# Patient Record
Sex: Female | Born: 1965
Health system: Southern US, Community
[De-identification: ages and names within clinical notes are randomized; demographics above are authoritative.]

## PROBLEM LIST (undated history)

## (undated) DIAGNOSIS — E0500 Thyrotoxicosis with diffuse goiter without thyrotoxic crisis or storm: Secondary | ICD-10-CM

## (undated) DIAGNOSIS — N393 Stress incontinence (female) (male): Secondary | ICD-10-CM

## (undated) DIAGNOSIS — I10 Essential (primary) hypertension: Secondary | ICD-10-CM

## (undated) DIAGNOSIS — N819 Female genital prolapse, unspecified: Secondary | ICD-10-CM

## (undated) DIAGNOSIS — Z85828 Personal history of other malignant neoplasm of skin: Secondary | ICD-10-CM

## (undated) DIAGNOSIS — C801 Malignant (primary) neoplasm, unspecified: Secondary | ICD-10-CM

## (undated) DIAGNOSIS — E079 Disorder of thyroid, unspecified: Secondary | ICD-10-CM

## (undated) HISTORY — DX: Essential (primary) hypertension: I10

## (undated) HISTORY — DX: Disorder of thyroid, unspecified: E07.9

## (undated) HISTORY — DX: Malignant (primary) neoplasm, unspecified: C80.1

---

## 1994-12-12 HISTORY — PX: BUNIONECTOMY: SHX129

## 2002-11-15 ENCOUNTER — Ambulatory Visit (HOSPITAL_COMMUNITY): Admission: RE | Admit: 2002-11-15 | Discharge: 2002-11-15 | Payer: Self-pay | Admitting: Family Medicine

## 2002-11-15 ENCOUNTER — Encounter: Payer: Self-pay | Admitting: Family Medicine

## 2003-03-07 ENCOUNTER — Encounter: Admission: RE | Admit: 2003-03-07 | Discharge: 2003-03-07 | Payer: Self-pay | Admitting: Obstetrics and Gynecology

## 2003-03-28 ENCOUNTER — Encounter (INDEPENDENT_AMBULATORY_CARE_PROVIDER_SITE_OTHER): Payer: Self-pay | Admitting: *Deleted

## 2003-03-28 ENCOUNTER — Encounter: Admission: RE | Admit: 2003-03-28 | Discharge: 2003-03-28 | Payer: Self-pay | Admitting: Family Medicine

## 2003-11-26 ENCOUNTER — Ambulatory Visit (HOSPITAL_COMMUNITY): Admission: RE | Admit: 2003-11-26 | Discharge: 2003-11-26 | Payer: Self-pay | Admitting: Family Medicine

## 2004-12-15 ENCOUNTER — Ambulatory Visit (HOSPITAL_COMMUNITY): Admission: RE | Admit: 2004-12-15 | Discharge: 2004-12-15 | Payer: Self-pay | Admitting: Family Medicine

## 2005-12-19 ENCOUNTER — Ambulatory Visit (HOSPITAL_COMMUNITY): Admission: RE | Admit: 2005-12-19 | Discharge: 2005-12-19 | Payer: Self-pay | Admitting: Family Medicine

## 2006-12-14 ENCOUNTER — Ambulatory Visit (HOSPITAL_COMMUNITY): Admission: RE | Admit: 2006-12-14 | Discharge: 2006-12-14 | Payer: Self-pay | Admitting: Family Medicine

## 2007-12-13 HISTORY — PX: BREAST BIOPSY: SHX20

## 2007-12-17 ENCOUNTER — Ambulatory Visit (HOSPITAL_COMMUNITY): Admission: RE | Admit: 2007-12-17 | Discharge: 2007-12-17 | Payer: Self-pay | Admitting: Family Medicine

## 2008-01-01 ENCOUNTER — Encounter (INDEPENDENT_AMBULATORY_CARE_PROVIDER_SITE_OTHER): Payer: Self-pay | Admitting: Diagnostic Radiology

## 2008-01-01 ENCOUNTER — Encounter: Admission: RE | Admit: 2008-01-01 | Discharge: 2008-01-01 | Payer: Self-pay | Admitting: Family Medicine

## 2008-01-02 HISTORY — PX: BREAST BIOPSY: SHX20

## 2008-12-12 DIAGNOSIS — E0500 Thyrotoxicosis with diffuse goiter without thyrotoxic crisis or storm: Secondary | ICD-10-CM

## 2008-12-12 HISTORY — DX: Thyrotoxicosis with diffuse goiter without thyrotoxic crisis or storm: E05.00

## 2008-12-26 ENCOUNTER — Ambulatory Visit (HOSPITAL_COMMUNITY): Admission: RE | Admit: 2008-12-26 | Discharge: 2008-12-26 | Payer: Self-pay | Admitting: Family Medicine

## 2009-12-30 ENCOUNTER — Ambulatory Visit (HOSPITAL_COMMUNITY): Admission: RE | Admit: 2009-12-30 | Discharge: 2009-12-30 | Payer: Self-pay | Admitting: Family Medicine

## 2010-12-31 ENCOUNTER — Ambulatory Visit (HOSPITAL_COMMUNITY)
Admission: RE | Admit: 2010-12-31 | Discharge: 2010-12-31 | Payer: Self-pay | Source: Home / Self Care | Attending: Family Medicine | Admitting: Family Medicine

## 2011-11-12 LAB — HM PAP SMEAR: HM Pap smear: NORMAL

## 2011-12-13 LAB — HM MAMMOGRAPHY: HM Mammogram: NORMAL

## 2011-12-19 ENCOUNTER — Other Ambulatory Visit (HOSPITAL_COMMUNITY): Payer: Self-pay | Admitting: Family Medicine

## 2011-12-19 DIAGNOSIS — Z1231 Encounter for screening mammogram for malignant neoplasm of breast: Secondary | ICD-10-CM

## 2012-01-13 ENCOUNTER — Ambulatory Visit (HOSPITAL_COMMUNITY)
Admission: RE | Admit: 2012-01-13 | Discharge: 2012-01-13 | Disposition: A | Discharge status: Home / Self Care | Payer: Managed Care, Other (non HMO) | Source: Ambulatory Visit | Attending: Family Medicine | Admitting: Family Medicine

## 2012-01-13 DIAGNOSIS — Z1231 Encounter for screening mammogram for malignant neoplasm of breast: Secondary | ICD-10-CM | POA: Insufficient documentation

## 2012-07-13 ENCOUNTER — Encounter: Payer: Self-pay | Admitting: Endocrinology

## 2012-07-13 ENCOUNTER — Other Ambulatory Visit (INDEPENDENT_AMBULATORY_CARE_PROVIDER_SITE_OTHER): Payer: Managed Care, Other (non HMO)

## 2012-07-13 ENCOUNTER — Ambulatory Visit (INDEPENDENT_AMBULATORY_CARE_PROVIDER_SITE_OTHER): Payer: Managed Care, Other (non HMO) | Admitting: Endocrinology

## 2012-07-13 VITALS — BP 138/82 | HR 93 | Temp 97.6°F | Ht 67.5 in | Wt 145.0 lb

## 2012-07-13 DIAGNOSIS — E0500 Thyrotoxicosis with diffuse goiter without thyrotoxic crisis or storm: Secondary | ICD-10-CM | POA: Insufficient documentation

## 2012-07-13 DIAGNOSIS — E0590 Thyrotoxicosis, unspecified without thyrotoxic crisis or storm: Secondary | ICD-10-CM

## 2012-07-13 LAB — TSH: TSH: 0.04 u[IU]/mL — ABNORMAL LOW (ref 0.35–5.50)

## 2012-07-13 LAB — T4, FREE: Free T4: 1.3 ng/dL (ref 0.60–1.60)

## 2012-07-13 NOTE — Patient Instructions (Addendum)
blood tests are being requested for you today.  You will receive a letter with results.   Please consider your treatment options, and let me know. if ever you have fever while taking methimazole, stop it and call us, because of the risk of a rare side-effect.

## 2012-07-13 NOTE — Progress Notes (Signed)
  Subjective:    Patient ID: Samantha Peck, female    DOB: 11-Apr-1966, 46 y.o.   MRN: [id number]  HPI Pt was dx'ed with hyperthyroidism approx 3 1/2 yeas ago.  She was rx'ed with tapazole, but she takes intermittently due to no insurance.  She reports moderate hair loss throughout the head, and assoc anxiety.  She last took tapazole approx 2 mos ago.   Past Medical History  Diagnosis Date  . Thyroid disease     Past Surgical History  Procedure Date  . Bunionectomy 1996    bilateral    History   Social History  . Marital Status: Married    Spouse Name: N/A    Number of Children: 3  . 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 Smoking status: Current Some Day Smoker    Types: Cigarettes  . Smokeless tobacco: Never Used   Comment: 1-2 cigarettes once in a while  . Alcohol Use: Yes  . Drug Use: No  . Sexually Active: Not on file   Other Topics Concern  . Not on file   Social History Narrative   Regular exercise-noCaffeine Use-yes    No current outpatient prescriptions on file prior to visit.    No Known Allergies  Family History  Problem Relation Age of Onset  . Cancer Mother     Breast Cancer  . Diabetes Mother   . Heart disease Mother   . Hypertension Mother   . Cancer Maternal Grandmother     Breast Cancer  no thyroid probs  BP 138/82  Pulse 93  Temp 97.6 F (36.4 C) (Oral)  Ht 5' 7.5" (1.715 m)  Wt 145 lb (65.772 kg)  BMI 22.38 kg/m2  SpO2 99%  LMP 06/25/2012    Review of Systems She has palpitations, constipation, and weight gain.  She has intermittent double vision. denies headache, hoarseness, sob, polyuria, myalgias, excessive diaphoresis, numbness, tremor,  hypoglycemia, easy bruising, and rhinorrhea.  She has reg menses.     Objective:   Physical Exam VS: see vs page GEN: no distress HEAD: head: no deformity.  Normal hair distribution. eyes: no periorbital swelling, no  proptosis external nose and ears are normal mouth: no lesion seen NECK: thyroid is 4x normal size, with irregular surface.  (R>L).  No nodule is palpable CHEST WALL: no deformity LUNGS:  Clear to auscultation. CV: reg rate and rhythm, no murmur ABD: abdomen is soft, nontender.  no hepatosplenomegaly.  not distended.  no hernia.   MUSCULOSKELETAL: muscle bulk and strength are grossly normal.  no obvious joint swelling.  gait is normal and steady EXTEMITIES: no deformity.  no ulcer on the feet.  feet are of normal color and temp.  no edema PULSES: dorsalis pedis intact bilat.   NEURO:  cn 2-12 grossly intact.   readily moves all 4's.  sensation is intact to touch on the feet.  No tremor. SKIN:  Normal texture and temperature.  No rash or suspicious lesion is visible.  No diaphoretic   NODES:  None palpable at the neck.   PSYCH: alert, oriented x3.  Does not appear anxious nor depressed.    (i reviewed outside records, including [facility])    Assessment & Plan:  Hyperthyroidism. Persistent.  Uncertain if this is due to grave's dx or multinodular goiter Hair loss.  Uncertain if this is thyroid-related Anxiety, prob due to hyperthyroidism.

## 2012-07-16 ENCOUNTER — Telehealth: Payer: Self-pay | Admitting: Endocrinology

## 2012-07-16 ENCOUNTER — Telehealth: Payer: Self-pay

## 2012-07-16 MED ORDER — METOPROLOL SUCCINATE ER 25 MG PO TB24: 25.0000 mg | ORAL_TABLET | Freq: Every day | ORAL | Status: DC

## 2012-07-16 MED ORDER — METHIMAZOLE 10 MG PO TABS: 10.0000 mg | ORAL_TABLET | Freq: Two times a day (BID) | ORAL | Status: DC

## 2012-07-16 NOTE — Telephone Encounter (Signed)
Pt called to inform MD of her decision on treatment for hyperthyroid. Left message on machine for pt to return my call.

## 2012-07-16 NOTE — Telephone Encounter (Signed)
Address in other phone note. Closing.

## 2012-07-16 NOTE — Telephone Encounter (Signed)
i have sent 2 prescriptions to your pharmacy Please come back for a follow-up appointment in 2 months.

## 2012-07-16 NOTE — Telephone Encounter (Signed)
Caller: Jena/Patient; PCP: Romero Belling; CB#: [phone]; 07/16/12 - Patient seen on 07/13/12 by Dr Everardo All concerning  her Thyroid and was asked to call him back and let him know whether she wanted to go back on the Methimazole to treat it or have radiation done to "kill" the thyroid. Patient would also like a medication to help her when her heart "races" and she has trouble breathing. She has had this symptom since being diagnosed with the Hyper-Thyroidism in 2010 and has had episodes during the day at work, as well as it waking her up at night. Did not speak with Dr Everardo All about it at her appt.  Last episode was 07/14/12 during the night. All Emergent S/S of Irregular Heartbeat Protocol r/o except " decrease in activity or exercise ability AND ongoing or repeated episodes of irregualr pulse or pulse rate more than 90 beats/minute at rest". Disposition - See in 24 ( does she need to come back in the office for evaluation of this symptom).  Home Care Advice Given. Patient wants Dr Everardo All to put her back on the  Methimazole at the right dosage per her labwork and a new medication for the racing heart/breathing problems caused by the Hyper-Thyroidism .  Uses [facility] - [address] [place], [place] @ [phone]. Would like to find out today (07/16/12) if possible - CB#: [phone].

## 2012-07-16 NOTE — Telephone Encounter (Signed)
Pt informed of new rx's and to schedule F/U appointment in 2 months. Pt will callback to schedule F/U appointment.

## 2012-08-29 ENCOUNTER — Other Ambulatory Visit: Payer: Self-pay | Admitting: *Deleted

## 2012-08-29 MED ORDER — METOPROLOL SUCCINATE ER 25 MG PO TB24: 25.0000 mg | ORAL_TABLET | Freq: Every day | ORAL | Status: DC

## 2012-08-29 MED ORDER — METHIMAZOLE 10 MG PO TABS: 10.0000 mg | ORAL_TABLET | Freq: Two times a day (BID) | ORAL | Status: DC

## 2012-08-29 NOTE — Telephone Encounter (Signed)
R'cd fax from [facility] for refill of Methimazole and Metoprolol for 90 day supply.

## 2012-09-12 ENCOUNTER — Other Ambulatory Visit: Payer: Self-pay | Admitting: Endocrinology

## 2012-09-12 ENCOUNTER — Telehealth: Payer: Self-pay

## 2012-09-12 DIAGNOSIS — E0590 Thyrotoxicosis, unspecified without thyrotoxic crisis or storm: Secondary | ICD-10-CM

## 2012-09-12 NOTE — Telephone Encounter (Signed)
Pt called stating that she was advised at last OV 08/02 that she needed follow up labs in 3 mths (10/2012). Pt is requesting orders be placed, please advise.

## 2012-09-12 NOTE — Telephone Encounter (Signed)
Tried calling Pt back no answer. TSH labs were placed at [facility].

## 2012-09-12 NOTE — Telephone Encounter (Signed)
F/u appt is due within the next week.  We can do then, or when you make appt, we can sched labs for 2 days ahead.

## 2012-09-13 NOTE — Telephone Encounter (Signed)
Pt notified, has appt on 09/21/12.

## 2012-09-14 ENCOUNTER — Other Ambulatory Visit (INDEPENDENT_AMBULATORY_CARE_PROVIDER_SITE_OTHER): Payer: Managed Care, Other (non HMO)

## 2012-09-14 DIAGNOSIS — E0590 Thyrotoxicosis, unspecified without thyrotoxic crisis or storm: Secondary | ICD-10-CM

## 2012-09-14 LAB — TSH: TSH: <0.01 u[IU]/mL — ABNORMAL LOW (ref 0.35–5.50)

## 2012-09-21 ENCOUNTER — Ambulatory Visit (INDEPENDENT_AMBULATORY_CARE_PROVIDER_SITE_OTHER): Payer: Managed Care, Other (non HMO) | Admitting: Endocrinology

## 2012-09-21 ENCOUNTER — Encounter: Payer: Self-pay | Admitting: Endocrinology

## 2012-09-21 VITALS — BP 126/80 | HR 104 | Temp 97.6°F | Wt 149.0 lb

## 2012-09-21 DIAGNOSIS — E0590 Thyrotoxicosis, unspecified without thyrotoxic crisis or storm: Secondary | ICD-10-CM

## 2012-09-21 MED ORDER — METHIMAZOLE 10 MG PO TABS: ORAL_TABLET | ORAL | Status: DC

## 2012-09-21 NOTE — Progress Notes (Signed)
  Subjective:    Patient ID: Samantha Peck, female    DOB: July 18, 1966, 46 y.o.   MRN: [id number]  HPI Pt was dx'ed with hyperthyroidism approx 3 1/2 yeas ago.  She was rx'ed with tapazole, but she takes intermittently due to no insurance.  She resumed tapazole 2 mos ago.  Since back on the tapazole, hair loss is less, but she continues to have anxiety.   Past Medical History  Diagnosis Date  . Thyroid disease     Past Surgical History  Procedure Date  . Bunionectomy 1996    bilateral    History   Social History  . Marital Status: Married    Spouse Name: N/A    Number of Children: 3  . 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 Smoking status: Current Some Day Smoker    Types: Cigarettes  . Smokeless tobacco: Never Used   Comment: 1-2 cigarettes once in a while  . Alcohol Use: Yes  . Drug Use: No  . Sexually Active: Not on file   Other Topics Concern  . Not on file   Social History Narrative   Regular exercise-noCaffeine Use-yes    Current Outpatient Prescriptions on File Prior to Visit  Medication Sig Dispense Refill  . metoprolol succinate (TOPROL-XL) 25 MG 24 hr tablet Take 1 tablet (25 mg total) by mouth daily.  90 tablet  0    No Known Allergies  Family History  Problem Relation Age of Onset  . Cancer Mother     Breast Cancer  . Diabetes Mother   . Heart disease Mother   . Hypertension Mother   . Cancer Maternal Grandmother     Breast Cancer    BP 126/80  Pulse 104  Temp 97.6 F (36.4 C) (Oral)  Wt 149 lb (67.586 kg)  SpO2 98%    Review of Systems Denies fever    Objective:   Physical Exam VITAL SIGNS:  See vs page GENERAL: no distress NECK: thyroid is 4x normal size, with irregular surface.  No nodule is palpable.    Lab Results  Component Value Date   TSH <0.01* 09/14/2012      Assessment & Plan:  Hyperthyroidism, needs increased rx

## 2012-09-21 NOTE — Patient Instructions (Addendum)
Please increase the methimazole to 2x10 mg, twice a day.  i have sent a prescription to your pharmacy. Please come back for a follow-up appointment for 1 month, with blood tests a few days in advance.   if ever you have fever while taking methimazole, stop it and call us, because of the risk of a rare side-effect.

## 2012-10-12 ENCOUNTER — Other Ambulatory Visit (INDEPENDENT_AMBULATORY_CARE_PROVIDER_SITE_OTHER): Payer: Managed Care, Other (non HMO)

## 2012-10-12 DIAGNOSIS — E0590 Thyrotoxicosis, unspecified without thyrotoxic crisis or storm: Secondary | ICD-10-CM

## 2012-10-12 LAB — T4, FREE: Free T4: 0.66 ng/dL (ref 0.60–1.60)

## 2012-10-12 LAB — TSH: TSH: 0.23 u[IU]/mL — ABNORMAL LOW (ref 0.35–5.50)

## 2012-10-19 ENCOUNTER — Encounter: Payer: Self-pay | Admitting: Endocrinology

## 2012-10-19 ENCOUNTER — Ambulatory Visit (INDEPENDENT_AMBULATORY_CARE_PROVIDER_SITE_OTHER): Payer: Managed Care, Other (non HMO) | Admitting: Endocrinology

## 2012-10-19 VITALS — BP 122/70 | HR 94 | Temp 97.8°F | Wt 154.0 lb

## 2012-10-19 DIAGNOSIS — E0590 Thyrotoxicosis, unspecified without thyrotoxic crisis or storm: Secondary | ICD-10-CM

## 2012-10-19 NOTE — Progress Notes (Signed)
  Subjective:    Patient ID: Samantha Peck, female    DOB: [birth year], 46 y.o.   MRN: [id number]  HPI Pt was dx'ed with hyperthyroidism in 2010.  Tapazole was chosen as rx, due to lack of insurance.  she took only intermittently.  She resumed daily tapazole 2 mos ago, having regained her insurance.  pt states she feels well in general, except for frequent, heavy menses.   Past Medical History  Diagnosis Date  . Thyroid disease     Past Surgical History  Procedure Date  . Bunionectomy 1996    bilateral    History   Social History  . Marital Status: Married    Spouse Name: N/A    Number of Children: 3  . 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 Smoking status: Current Some Day Smoker    Types: Cigarettes  . Smokeless tobacco: Never Used     Comment: 1-2 cigarettes once in a while  . Alcohol Use: Yes  . Drug Use: No  . Sexually Active: Not on file   Other Topics Concern  . Not on file   Social History Narrative   Regular exercise-noCaffeine Use-yes    Current Outpatient Prescriptions on File Prior to Visit  Medication Sig Dispense Refill  . metoprolol succinate (TOPROL-XL) 25 MG 24 hr tablet Take 1 tablet (25 mg total) by mouth daily.  90 tablet  0    No Known Allergies  Family History  Problem Relation Age of Onset  . Cancer Mother     Breast Cancer  . Diabetes Mother   . Heart disease Mother   . Hypertension Mother   . Cancer Maternal Grandmother     Breast Cancer    BP 122/70  Pulse 94  Temp 97.8 F (36.6 C) (Oral)  Wt 154 lb (69.854 kg)  SpO2 97%  Review of Systems She has gained weight.  No fever.     Objective:   Physical Exam VITAL SIGNS:  See vs page.   GENERAL: no distress. NECK: thyroid is 4x normal size, with irregular surface.  No nodule is palpable.  Lab Results  Component Value Date   TSH 0.23* 10/12/2012      Assessment & Plan:  Hyperthyroidism, improved on tapazole

## 2012-10-19 NOTE — Patient Instructions (Addendum)
Please reduce the methimazole to 1 pill twice a day.   Please come back for a follow-up appointment for 1 month, with blood tests a few days prior.

## 2012-11-16 ENCOUNTER — Ambulatory Visit: Payer: Managed Care, Other (non HMO) | Admitting: Endocrinology

## 2012-11-21 ENCOUNTER — Telehealth: Payer: Self-pay | Admitting: *Deleted

## 2012-11-21 ENCOUNTER — Other Ambulatory Visit (INDEPENDENT_AMBULATORY_CARE_PROVIDER_SITE_OTHER): Payer: Managed Care, Other (non HMO)

## 2012-11-21 DIAGNOSIS — E0590 Thyrotoxicosis, unspecified without thyrotoxic crisis or storm: Secondary | ICD-10-CM

## 2012-11-21 NOTE — Telephone Encounter (Signed)
i ordered

## 2012-11-21 NOTE — Telephone Encounter (Signed)
PATIENT WENT TO [facility] TODAY AND BLOOD WAS DRAWN FROM PATIENT BUT NO LAB ORDERS. PLEASE ADVISE [facility] OF ORDERS.

## 2012-11-22 LAB — TSH: TSH: 0.51 u[IU]/mL (ref 0.35–5.50)

## 2012-11-22 LAB — T4, FREE: Free T4: 0.94 ng/dL (ref 0.60–1.60)

## 2012-11-23 ENCOUNTER — Ambulatory Visit (INDEPENDENT_AMBULATORY_CARE_PROVIDER_SITE_OTHER): Payer: Managed Care, Other (non HMO) | Admitting: Endocrinology

## 2012-11-23 ENCOUNTER — Encounter: Payer: Self-pay | Admitting: Endocrinology

## 2012-11-23 VITALS — BP 126/80 | HR 100 | Temp 98.6°F | Wt 150.0 lb

## 2012-11-23 DIAGNOSIS — E0590 Thyrotoxicosis, unspecified without thyrotoxic crisis or storm: Secondary | ICD-10-CM

## 2012-11-23 NOTE — Progress Notes (Signed)
  Subjective:    Patient ID: Samantha Peck, female    DOB: 07-31-1966, 46 y.o.   MRN: [id number]  HPI Pt was dx'ed with hyperthyroidism in 2010.  Tapazole was chosen as rx, due to lack of insurance.  she took only intermittently.  She resumed daily tapazole in 2013, having regained her insurance.  She wants to stay with this rx, at least for now.  pt states she feels well in general.   Past Medical History  Diagnosis Date  . Thyroid disease     Past Surgical History  Procedure Date  . Bunionectomy 1996    bilateral    History   Social History  . Marital Status: Married    Spouse Name: N/A    Number of Children: 3  . 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 Smoking status: Current Some Day Smoker    Types: Cigarettes  . Smokeless tobacco: Never Used     Comment: 1-2 cigarettes once in a while  . Alcohol Use: Yes  . Drug Use: No  . Sexually Active: Not on file   Other Topics Concern  . Not on file   Social History Narrative   Regular exercise-noCaffeine Use-yes    Current Outpatient Prescriptions on File Prior to Visit  Medication Sig Dispense Refill  . methimazole (TAPAZOLE) 10 MG tablet Take 10 mg by mouth daily.       . metoprolol succinate (TOPROL-XL) 25 MG 24 hr tablet Take 1 tablet (25 mg total) by mouth daily.  90 tablet  0    No Known Allergies  Family History  Problem Relation Age of Onset  . Cancer Mother     Breast Cancer  . Diabetes Mother   . Heart disease Mother   . Hypertension Mother   . Cancer Maternal Grandmother     Breast Cancer    BP 126/80  Pulse 100  Temp 98.6 F (37 C) (Oral)  Wt 150 lb (68.04 kg)  SpO2 97%    Review of Systems Denies fever    Objective:   Physical Exam VITAL SIGNS:  See vs page GENERAL: no distress NECK: thyroid is 4x normal size, with irregular surface.  No nodule is palpable.    Lab Results  Component Value Date   TSH 0.51 11/21/2012       Assessment & Plan:  Hyperthyroidism, much better

## 2012-11-23 NOTE — Patient Instructions (Addendum)
Please reduce the methimazole to 1 pill daily.   Please come back for a follow-up appointment in 6 weeks, with blood tests a few days prior.

## 2012-12-18 ENCOUNTER — Other Ambulatory Visit (HOSPITAL_COMMUNITY): Payer: Self-pay | Admitting: Family Medicine

## 2012-12-18 DIAGNOSIS — Z1231 Encounter for screening mammogram for malignant neoplasm of breast: Secondary | ICD-10-CM

## 2013-01-01 ENCOUNTER — Other Ambulatory Visit: Payer: Self-pay | Admitting: Endocrinology

## 2013-01-01 ENCOUNTER — Other Ambulatory Visit: Payer: Managed Care, Other (non HMO)

## 2013-01-02 LAB — T4, FREE: Free T4: 0.94 ng/dL (ref 0.80–1.80)

## 2013-01-02 LAB — TSH: TSH: 4.367 u[IU]/mL (ref 0.350–4.500)

## 2013-01-04 ENCOUNTER — Encounter: Payer: Self-pay | Admitting: Endocrinology

## 2013-01-04 ENCOUNTER — Ambulatory Visit (INDEPENDENT_AMBULATORY_CARE_PROVIDER_SITE_OTHER): Payer: Managed Care, Other (non HMO) | Admitting: Endocrinology

## 2013-01-04 VITALS — BP 120/68 | HR 78 | Wt 153.0 lb

## 2013-01-04 DIAGNOSIS — E0590 Thyrotoxicosis, unspecified without thyrotoxic crisis or storm: Secondary | ICD-10-CM

## 2013-01-04 NOTE — Patient Instructions (Addendum)
Please reduce the methimazole to 1/2 pill per day. Please come back for a follow-up appointment in 1 month, with blood tests a few days prior.

## 2013-01-04 NOTE — Progress Notes (Signed)
  Subjective:    Patient ID: Samantha Peck, female    DOB: 1966-07-07, 47 y.o.   MRN: [id number]  Thyroid Problem  Pt was dx'ed with hyperthyroidism in 2010.  Tapazole was chosen as rx, due to lack of insurance.  she took only intermittently.  She resumed daily tapazole in 2013, having regained her insurance.  She wants to stay with this rx, at least for now.  pt states she feels well in general, except for palpitations in the chest.  The tapazole was reduced in dec, 2013. Past Medical History  Diagnosis Date  . Thyroid disease     Past Surgical History  Procedure Date  . Bunionectomy 1996    bilateral    History   Social History  . Marital Status: Married    Spouse Name: N/A    Number of Children: 3  . 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 Smoking status: Current Some Day Smoker    Types: Cigarettes  . Smokeless tobacco: Never Used     Comment: 1-2 cigarettes once in a while  . Alcohol Use: Yes  . Drug Use: No  . Sexually Active: Not on file   Other Topics Concern  . Not on file   Social History Narrative   Regular exercise-noCaffeine Use-yes    Current Outpatient Prescriptions on File Prior to Visit  Medication Sig Dispense Refill  . methimazole (TAPAZOLE) 10 MG tablet Take 10 mg by mouth daily.       . metoprolol succinate (TOPROL-XL) 25 MG 24 hr tablet Take 1 tablet (25 mg total) by mouth daily.  90 tablet  0    No Known Allergies  Family History  Problem Relation Age of Onset  . Cancer Mother     Breast Cancer  . Diabetes Mother   . Heart disease Mother   . Hypertension Mother   . Cancer Maternal Grandmother     Breast Cancer    BP 120/68  Pulse 78  Wt 153 lb (69.4 kg)  SpO2 97%    Review of Systems  Denies fever    Objective:   Physical Exam VITAL SIGNS:  See vs page GENERAL: no distress NECK: thyroid is 3x normal size, with irregular surface.  No nodule is palpable.    Lab  Results  Component Value Date   TSH 4.367 01/01/2013      Assessment & Plan:  Hyperthyroidism, well-controlled, but this TSH suggests she needs further reduction of her tapazole

## 2013-01-18 ENCOUNTER — Ambulatory Visit (HOSPITAL_COMMUNITY)
Admission: RE | Admit: 2013-01-18 | Discharge: 2013-01-18 | Disposition: A | Discharge status: Home / Self Care | Payer: Managed Care, Other (non HMO) | Source: Ambulatory Visit | Attending: Family Medicine | Admitting: Family Medicine

## 2013-01-18 DIAGNOSIS — Z1231 Encounter for screening mammogram for malignant neoplasm of breast: Secondary | ICD-10-CM | POA: Insufficient documentation

## 2013-02-15 ENCOUNTER — Ambulatory Visit: Payer: Managed Care, Other (non HMO) | Admitting: Endocrinology

## 2013-02-20 ENCOUNTER — Ambulatory Visit (INDEPENDENT_AMBULATORY_CARE_PROVIDER_SITE_OTHER): Payer: Managed Care, Other (non HMO)

## 2013-02-20 DIAGNOSIS — E0590 Thyrotoxicosis, unspecified without thyrotoxic crisis or storm: Secondary | ICD-10-CM

## 2013-02-20 LAB — T4, FREE: Free T4: 0.89 ng/dL (ref 0.60–1.60)

## 2013-02-20 LAB — TSH: TSH: 0.62 u[IU]/mL (ref 0.35–5.50)

## 2013-02-22 ENCOUNTER — Ambulatory Visit (INDEPENDENT_AMBULATORY_CARE_PROVIDER_SITE_OTHER): Payer: Managed Care, Other (non HMO) | Admitting: Endocrinology

## 2013-02-22 ENCOUNTER — Encounter: Payer: Self-pay | Admitting: Endocrinology

## 2013-02-22 VITALS — BP 122/70 | HR 100 | Wt 156.0 lb

## 2013-02-22 DIAGNOSIS — E0590 Thyrotoxicosis, unspecified without thyrotoxic crisis or storm: Secondary | ICD-10-CM

## 2013-02-22 NOTE — Progress Notes (Signed)
  Subjective:    Patient ID: Samantha Peck, female    DOB: 06-29-1966, 47 y.o.   MRN: [id number]  HPI Pt was dx'ed with hyperthyroidism in 2010.  Tapazole was chosen as rx, due to lack of insurance.  she took only intermittently.  She resumed daily tapazole in 2013, having regained her insurance.  She wants to stay with this rx, at least for now.  pt states she feels well in general, except for resumption of her hair loss.  We have been reducing the tapazole.  Past Medical History  Diagnosis Date  . Thyroid disease     Past Surgical History  Procedure Laterality Date  . Bunionectomy  1996    bilateral    History   Social History  . Marital Status: Married    Spouse Name: N/A    Number of Children: 3  . 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 Smoking status: Current Some Day Smoker    Types: Cigarettes  . Smokeless tobacco: Never Used     Comment: 1-2 cigarettes once in a while  . Alcohol Use: Yes  . Drug Use: No  . Sexually Active: Not on file   Other Topics Concern  . Not on file   Social History Narrative   Regular exercise-no   Caffeine Use-yes    Current Outpatient Prescriptions on File Prior to Visit  Medication Sig Dispense Refill  . methimazole (TAPAZOLE) 10 MG tablet Take 5 mg by mouth daily.       . metoprolol succinate (TOPROL-XL) 25 MG 24 hr tablet Take 1 tablet (25 mg total) by mouth daily.  90 tablet  0   No current facility-administered medications on file prior to visit.    No Known Allergies  Family History  Problem Relation Age of Onset  . Cancer Mother     Breast Cancer  . Diabetes Mother   . Heart disease Mother   . Hypertension Mother   . Cancer Maternal Grandmother     Breast Cancer   BP 122/70  Pulse 100  Wt 156 lb (70.761 kg)  BMI 24.06 kg/m2  SpO2 96%  Review of Systems Denies fever    Objective:   Physical Exam VITAL SIGNS:  See vs page GENERAL: no distress Skin:  not diaphoretic Neuro: no tremor  Lab Results  Component Value Date   TSH 0.62 02/20/2013      Assessment & Plan:  Hyperthyroidism, well-controlled.

## 2013-02-22 NOTE — Patient Instructions (Addendum)
Please continue the same medication. Please come back for a follow-up appointment in 6 weeks, with blood tests a few days prior.

## 2013-04-03 ENCOUNTER — Ambulatory Visit (INDEPENDENT_AMBULATORY_CARE_PROVIDER_SITE_OTHER): Payer: Managed Care, Other (non HMO)

## 2013-04-03 DIAGNOSIS — E0590 Thyrotoxicosis, unspecified without thyrotoxic crisis or storm: Secondary | ICD-10-CM

## 2013-04-03 LAB — TSH: TSH: 0.78 u[IU]/mL (ref 0.35–5.50)

## 2013-04-03 LAB — T4, FREE: Free T4: 0.81 ng/dL (ref 0.60–1.60)

## 2013-04-05 ENCOUNTER — Encounter: Payer: Self-pay | Admitting: Endocrinology

## 2013-04-05 ENCOUNTER — Ambulatory Visit (INDEPENDENT_AMBULATORY_CARE_PROVIDER_SITE_OTHER): Payer: Managed Care, Other (non HMO) | Admitting: Endocrinology

## 2013-04-05 VITALS — BP 126/74 | HR 75 | Wt 159.0 lb

## 2013-04-05 DIAGNOSIS — E0590 Thyrotoxicosis, unspecified without thyrotoxic crisis or storm: Secondary | ICD-10-CM

## 2013-04-05 NOTE — Patient Instructions (Addendum)
Please reduce the metoprolol to 1/2 pill daily.   Please come back for a follow-up appointment in 3 months, with blood tests a few days prior.

## 2013-04-05 NOTE — Progress Notes (Signed)
  Subjective:    Patient ID: Samantha Peck, female    DOB: 04/16/1966, 47 y.o.   MRN: [id number]  HPI Pt was dx'ed with hyperthyroidism in 2010.  Tapazole was chosen as rx, due to lack of insurance.  she took only intermittently.  She resumed daily tapazole in 2013, having regained her insurance.  She wants to stay with this rx, at least for now.  pt states she feels well in general, except for weight gain. Past Medical History  Diagnosis Date  . Thyroid disease     Past Surgical History  Procedure Laterality Date  . Bunionectomy  1996    bilateral    History   Social History  . Marital Status: Married    Spouse Name: N/A    Number of Children: 3  . 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 Smoking status: Current Some Day Smoker    Types: Cigarettes  . Smokeless tobacco: Never Used     Comment: 1-2 cigarettes once in a while  . Alcohol Use: Yes  . Drug Use: No  . Sexually Active: Not on file   Other Topics Concern  . Not on file   Social History Narrative   Regular exercise-no   Caffeine Use-yes    Current Outpatient Prescriptions on File Prior to Visit  Medication Sig Dispense Refill  . methimazole (TAPAZOLE) 10 MG tablet Take 5 mg by mouth daily.        No current facility-administered medications on file prior to visit.    No Known Allergies  Family History  Problem Relation Age of Onset  . Cancer Mother     Breast Cancer  . Diabetes Mother   . Heart disease Mother   . Hypertension Mother   . Cancer Maternal Grandmother     Breast Cancer    BP 126/74  Pulse 75  Wt 159 lb (72.122 kg)  BMI 24.52 kg/m2  SpO2 97%  Review of Systems Denies fever.    Objective:   Physical Exam VITAL SIGNS:  See vs page GENERAL: no distress NECK: thyroid is 4x normal size, with irregular surface (L>R).  No nodule is palpable.   Lab Results  Component Value Date   TSH 0.78 04/03/2013      Assessment &  Plan:  Hyperthyroidism, well-controlled

## 2013-06-05 ENCOUNTER — Ambulatory Visit: Payer: Managed Care, Other (non HMO)

## 2013-06-05 DIAGNOSIS — E0590 Thyrotoxicosis, unspecified without thyrotoxic crisis or storm: Secondary | ICD-10-CM

## 2013-06-06 LAB — T4, FREE: Free T4: 0.82 ng/dL (ref 0.60–1.60)

## 2013-06-06 LAB — TSH: TSH: 1.12 u[IU]/mL (ref 0.35–5.50)

## 2013-06-07 ENCOUNTER — Encounter: Payer: Self-pay | Admitting: Endocrinology

## 2013-06-07 ENCOUNTER — Ambulatory Visit (INDEPENDENT_AMBULATORY_CARE_PROVIDER_SITE_OTHER): Payer: Managed Care, Other (non HMO) | Admitting: Endocrinology

## 2013-06-07 VITALS — BP 132/80 | HR 109 | Ht 67.0 in | Wt 148.0 lb

## 2013-06-07 DIAGNOSIS — E0590 Thyrotoxicosis, unspecified without thyrotoxic crisis or storm: Secondary | ICD-10-CM

## 2013-06-07 NOTE — Patient Instructions (Addendum)
Please come back for a follow-up appointment in 4 months, with blood tests a few days prior.   Please continue the same methimazole.  Please see dr Jeannetta Nap, for the fact that your heartbeat is still fast, despite the normal thyroid.

## 2013-06-07 NOTE — Progress Notes (Signed)
  Subjective:    Patient ID: Samantha Peck, female    DOB: [birth year], 47 y.o.   MRN: [id number]  HPI Pt was dx'ed with hyperthyroidism in 2010.  Tapazole was chosen as rx, due to lack of insurance.  she took only intermittently.  She resumed daily tapazole in 2013, having regained her insurance.  She wants to stay with this rx, at least for now.  She takes it as rx'ed.  pt states she feels well in general.  She has lost weight, due to her efforts. Past Medical History  Diagnosis Date  . Thyroid disease     Past Surgical History  Procedure Laterality Date  . Bunionectomy  1996    bilateral    History   Social History  . Marital Status: Married    Spouse Name: N/A    Number of Children: 3  . 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 Smoking status: Current Some Day Smoker    Types: Cigarettes  . Smokeless tobacco: Never Used     Comment: 1-2 cigarettes once in a while  . Alcohol Use: Yes  . Drug Use: No  . Sexually Active: Not on file   Other Topics Concern  . Not on file   Social History Narrative   Regular exercise-no   Caffeine Use-yes    Current Outpatient Prescriptions on File Prior to Visit  Medication Sig Dispense Refill  . methimazole (TAPAZOLE) 10 MG tablet Take 5 mg by mouth daily.       . metoprolol succinate (TOPROL-XL) 25 MG 24 hr tablet 1/2 tab daily       No current facility-administered medications on file prior to visit.   No Known Allergies  Family History  Problem Relation Age of Onset  . Cancer Mother     Breast Cancer  . Diabetes Mother   . Heart disease Mother   . Hypertension Mother   . Cancer Maternal Grandmother     Breast Cancer   BP 132/80  Pulse 109  Ht 5\' 7"  (1.702 m)  Wt 148 lb (67.132 kg)  BMI 23.17 kg/m2  SpO2 95%  Review of Systems Denies fever and tremor    Objective:   Physical Exam VITAL SIGNS:  See vs page GENERAL: no distress NECK: thyroid is 4x normal  size, with irregular surface (L>R).  No nodule is palpable.    Lab Results  Component Value Date   TSH 1.12 06/05/2013      Assessment & Plan:  Hyperthyroidism, well-controlled Tachycardia, persistent.

## 2013-10-09 ENCOUNTER — Other Ambulatory Visit: Payer: Self-pay | Admitting: Endocrinology

## 2013-10-10 LAB — TSH: TSH: 3.31 u[IU]/mL (ref 0.350–4.500)

## 2013-10-10 LAB — T4, FREE: Free T4: 1.02 ng/dL (ref 0.80–1.80)

## 2013-10-11 ENCOUNTER — Encounter: Payer: Self-pay | Admitting: Endocrinology

## 2013-10-11 ENCOUNTER — Ambulatory Visit (INDEPENDENT_AMBULATORY_CARE_PROVIDER_SITE_OTHER): Payer: Managed Care, Other (non HMO) | Admitting: Endocrinology

## 2013-10-11 VITALS — BP 118/68 | HR 92 | Temp 98.0°F | Resp 10 | Ht 67.0 in | Wt 148.3 lb

## 2013-10-11 DIAGNOSIS — E0590 Thyrotoxicosis, unspecified without thyrotoxic crisis or storm: Secondary | ICD-10-CM

## 2013-10-11 NOTE — Progress Notes (Signed)
  Subjective:    Patient ID: Samantha Peck, female    DOB: Sep 16, 1966, 47 y.o.   MRN: [id number]  HPI Pt was dx'ed with hyperthyroidism in 2010.  Tapazole was chosen as rx, due to lack of insurance.  she took only intermittently.  She resumed daily tapazole in 2013, having regained her insurance.  She wants to stay with this rx, at least for now.  She takes it as rx'ed.  pt states she feels well in general.  She has lost weight, due to her efforts. Past Medical History  Diagnosis Date  . Thyroid disease     Past Surgical History  Procedure Laterality Date  . Bunionectomy  1996    bilateral    History   Social History  . Marital Status: Married    Spouse Name: N/A    Number of Children: 3  . 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 Smoking status: Current Some Day Smoker    Types: Cigarettes  . Smokeless tobacco: Never Used     Comment: 1-2 cigarettes once in a while  . Alcohol Use: Yes  . Drug Use: No  . Sexual Activity: Not on file   Other Topics Concern  . Not on file   Social History Narrative   Regular exercise-no   Caffeine Use-yes    Current Outpatient Prescriptions on File Prior to Visit  Medication Sig Dispense Refill  . methimazole (TAPAZOLE) 10 MG tablet Take 5 mg by mouth daily.       . metoprolol succinate (TOPROL-XL) 25 MG 24 hr tablet Take 25 mg by mouth as needed. 1/2 tab daily       No current facility-administered medications on file prior to visit.    No Known Allergies  Family History  Problem Relation Age of Onset  . Cancer Mother     Breast Cancer  . Diabetes Mother   . Heart disease Mother   . Hypertension Mother   . Cancer Maternal Grandmother     Breast Cancer    BP 118/68  Pulse 92  Temp(Src) 98 F (36.7 C) (Oral)  Resp 10  Ht 5\' 7"  (1.702 m)  Wt 148 lb 4.8 oz (67.268 kg)  BMI 23.22 kg/m2  SpO2 99%  Review of Systems Denies fever    Objective:   Physical  Exam VITAL SIGNS:  See vs page GENERAL: no distress NECK: thyroid is approx 4x normal size, with irregular surface (L>R).  No nodule is palpable.    Lab Results  Component Value Date   TSH 3.310 10/09/2013      Assessment & Plan:  Hyperthyroidism, well-controlled Goiter: stable

## 2013-10-11 NOTE — Patient Instructions (Signed)
Please come back for a follow-up appointment in 6 months, with blood tests a few days prior.   Please continue the same methimazole.  if ever you have fever while taking methimazole, stop it and call us, because of the risk of a rare side-effect.  

## 2013-12-10 ENCOUNTER — Other Ambulatory Visit (HOSPITAL_COMMUNITY): Payer: Self-pay | Admitting: Family Medicine

## 2013-12-10 DIAGNOSIS — Z1231 Encounter for screening mammogram for malignant neoplasm of breast: Secondary | ICD-10-CM

## 2013-12-25 ENCOUNTER — Other Ambulatory Visit: Payer: Self-pay

## 2013-12-25 MED ORDER — METHIMAZOLE 10 MG PO TABS: 5.0000 mg | ORAL_TABLET | Freq: Every day | ORAL | Status: DC

## 2014-01-24 ENCOUNTER — Ambulatory Visit (HOSPITAL_COMMUNITY)
Admission: RE | Admit: 2014-01-24 | Discharge: 2014-01-24 | Disposition: A | Discharge status: Home / Self Care | Payer: Managed Care, Other (non HMO) | Source: Ambulatory Visit | Attending: Family Medicine | Admitting: Family Medicine

## 2014-01-24 DIAGNOSIS — Z1231 Encounter for screening mammogram for malignant neoplasm of breast: Secondary | ICD-10-CM | POA: Insufficient documentation

## 2014-04-09 ENCOUNTER — Other Ambulatory Visit: Payer: Self-pay

## 2014-04-09 ENCOUNTER — Other Ambulatory Visit (INDEPENDENT_AMBULATORY_CARE_PROVIDER_SITE_OTHER): Payer: Managed Care, Other (non HMO)

## 2014-04-09 DIAGNOSIS — E0590 Thyrotoxicosis, unspecified without thyrotoxic crisis or storm: Secondary | ICD-10-CM

## 2014-04-09 LAB — TSH: TSH: 1.68 u[IU]/mL (ref 0.35–5.50)

## 2014-04-09 LAB — T4, FREE: Free T4: 0.82 ng/dL (ref 0.60–1.60)

## 2014-04-11 ENCOUNTER — Encounter: Payer: Self-pay | Admitting: Endocrinology

## 2014-04-11 ENCOUNTER — Ambulatory Visit (INDEPENDENT_AMBULATORY_CARE_PROVIDER_SITE_OTHER): Payer: Managed Care, Other (non HMO) | Admitting: Endocrinology

## 2014-04-11 VITALS — BP 132/70 | HR 103 | Temp 97.9°F | Ht 67.0 in | Wt 149.0 lb

## 2014-04-11 DIAGNOSIS — E0590 Thyrotoxicosis, unspecified without thyrotoxic crisis or storm: Secondary | ICD-10-CM

## 2014-04-11 MED ORDER — TRIAMCINOLONE ACETONIDE 0.1 % EX CREA: 1.0000 "application " | TOPICAL_CREAM | Freq: Three times a day (TID) | CUTANEOUS | Status: DC

## 2014-04-11 NOTE — Patient Instructions (Addendum)
Please come back for a follow-up appointment in 6 months, with blood tests a few days prior.   Please continue the same methimazole.  if ever you have fever while taking methimazole, stop it and call us, because of the risk of a rare side-effect.  i have sent a prescription to your pharmacy, for a steroid cream.   Try taking antihistamine eye drops as needed.

## 2014-04-11 NOTE — Progress Notes (Signed)
  Subjective:    Patient ID: Samantha Peck, female    DOB: 01-17-1966, 48 y.o.   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   Pt states 2 weeks of moderate rash throughout the body, and assoc itching. Past Medical History  Diagnosis Date  . Thyroid disease     Past Surgical History  Procedure Laterality Date  . Bunionectomy  1996    bilateral    History   Social History  . Marital Status: Married    Spouse Name: N/A    Number of Children: 3  . 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 Smoking status: Current Some Day Smoker    Types: Cigarettes  . Smokeless tobacco: Never Used     Comment: 1-2 cigarettes once in a while  . Alcohol Use: Yes  . Drug Use: No  . Sexual Activity: Not on file   Other Topics Concern  . Not on file   Social History Narrative   Regular exercise-no   Caffeine Use-yes    Current Outpatient Prescriptions on File Prior to Visit  Medication Sig Dispense Refill  . methimazole (TAPAZOLE) 10 MG tablet Take 0.5 tablets (5 mg total) by mouth daily.  30 tablet  3  . metoprolol succinate (TOPROL-XL) 25 MG 24 hr tablet Take 25 mg by mouth as needed. 1/2 tab daily       No current facility-administered medications on file prior to visit.    No Known Allergies  Family History  Problem Relation Age of Onset  . Cancer Mother     Breast Cancer  . Diabetes Mother   . Heart disease Mother   . Hypertension Mother   . Cancer Maternal Grandmother     Breast Cancer    BP 132/70  Pulse 103  Temp(Src) 97.9 F (36.6 C) (Oral)  Ht 5\' 7"  (1.702 m)  Wt 149 lb (67.586 kg)  BMI 23.33 kg/m2  SpO2 97%  Review of  Systems Denies fever and weight change.      Objective:   Physical Exam VITAL SIGNS:  See vs page GENERAL: no distress Left eye: slight conjunctival injection.   Skin: mild diffuse urticaria.     Lab Results  Component Value Date   TSH 1.68 04/09/2014      Assessment & Plan:  Hyperthyroidism, well-controlled Goiter: stable Urticaria: new, uncertain etiology

## 2014-07-14 ENCOUNTER — Telehealth: Payer: Self-pay | Admitting: Endocrinology

## 2014-09-12 ENCOUNTER — Other Ambulatory Visit: Payer: Self-pay

## 2014-09-12 MED ORDER — METHIMAZOLE 10 MG PO TABS: 5.0000 mg | ORAL_TABLET | Freq: Every day | ORAL | Status: DC

## 2014-09-17 ENCOUNTER — Telehealth: Payer: Self-pay

## 2014-09-17 MED ORDER — METHIMAZOLE 10 MG PO TABS: 5.0000 mg | ORAL_TABLET | Freq: Every day | ORAL | Status: DC

## 2014-09-17 NOTE — Telephone Encounter (Signed)
Rx sent for Methimazole to [facility].

## 2014-10-08 ENCOUNTER — Other Ambulatory Visit (INDEPENDENT_AMBULATORY_CARE_PROVIDER_SITE_OTHER): Payer: Managed Care, Other (non HMO)

## 2014-10-08 ENCOUNTER — Other Ambulatory Visit: Payer: Self-pay

## 2014-10-08 DIAGNOSIS — E0590 Thyrotoxicosis, unspecified without thyrotoxic crisis or storm: Secondary | ICD-10-CM

## 2014-10-08 LAB — T4, FREE: Free T4: 1 ng/dL (ref 0.60–1.60)

## 2014-10-08 LAB — TSH: TSH: 1.65 u[IU]/mL (ref 0.35–4.50)

## 2014-10-13 ENCOUNTER — Ambulatory Visit: Payer: Managed Care, Other (non HMO) | Admitting: Endocrinology

## 2014-10-13 ENCOUNTER — Ambulatory Visit (INDEPENDENT_AMBULATORY_CARE_PROVIDER_SITE_OTHER): Payer: Managed Care, Other (non HMO) | Admitting: Endocrinology

## 2014-10-13 ENCOUNTER — Encounter: Payer: Self-pay | Admitting: Endocrinology

## 2014-10-13 VITALS — BP 120/88 | HR 93 | Temp 98.2°F | Ht 67.0 in | Wt 147.0 lb

## 2014-10-13 DIAGNOSIS — E0590 Thyrotoxicosis, unspecified without thyrotoxic crisis or storm: Secondary | ICD-10-CM

## 2014-10-13 NOTE — Patient Instructions (Signed)
Please come back for a follow-up appointment in 6 months, with blood tests a few days prior.   Please continue the same methimazole.  if ever you have fever while taking methimazole, stop it and call us, because of the risk of a rare side-effect.

## 2014-10-13 NOTE — Progress Notes (Signed)
   Subjective:    Patient ID: Samantha Peck, female    DOB: 05/21/1966, 48 y.o.   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  pt states she feels well in general.   Past Medical History  Diagnosis Date  . Thyroid disease     Past Surgical History  Procedure Laterality Date  . Bunionectomy  1996    bilateral    History   Social History  . Marital Status: Married    Spouse Name: N/A    Number of Children: 3  . 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 Smoking status: Current Some Day Smoker    Types: Cigarettes  . Smokeless tobacco: Never Used     Comment: 1-2 cigarettes once in a while  . Alcohol Use: Yes  . Drug Use: No  . Sexual Activity: Not on file   Other Topics Concern  . Not on file   Social History Narrative   Regular exercise-no   Caffeine Use-yes    Current Outpatient Prescriptions on File Prior to Visit  Medication Sig Dispense Refill  . clonazePAM (KLONOPIN) 0.5 MG tablet     . methimazole (TAPAZOLE) 10 MG tablet Take 0.5 tablets (5 mg total) by mouth daily. 30 tablet 3  . triamcinolone cream (KENALOG) 0.1 % Apply 1 application topically 3 (three) times daily. As needed for itching 45 g 1  . metoprolol succinate (TOPROL-XL) 25 MG 24 hr tablet Take 25 mg by mouth as needed. 1/2 tab daily     No current facility-administered medications on file prior to visit.    No Known Allergies  Family History  Problem Relation Age of Onset  . Cancer Mother     Breast Cancer  . Diabetes Mother   . Heart disease Mother   . Hypertension Mother   . Cancer Maternal Grandmother     Breast Cancer    BP 120/88 mmHg  Pulse  93  Temp(Src) 98.2 F (36.8 C) (Oral)  Ht 5\' 7"  (1.702 m)  Wt 147 lb (66.679 kg)  BMI 23.02 kg/m2  SpO2 94%  Review of Systems Denies fever.     Objective:   Physical Exam VITAL SIGNS:  See vs page GENERAL: no distress NECK: thyroid is approx 4x normal size, with irregular surface (L>R).  No nodule is palpable.    Lab Results  Component Value Date   TSH 1.65 10/08/2014       Assessment & Plan:  Hyperthyroidism: well-controlled.   Patient is advised the following: Patient Instructions  Please come back for a follow-up appointment in 6 months, with blood tests a few days prior.   Please continue the same methimazole.  if ever you have fever while taking methimazole, stop it and call us, because of the risk of a rare side-effect.

## 2014-10-24 ENCOUNTER — Other Ambulatory Visit (HOSPITAL_COMMUNITY): Payer: Self-pay | Admitting: Family Medicine

## 2014-10-24 DIAGNOSIS — Z1231 Encounter for screening mammogram for malignant neoplasm of breast: Secondary | ICD-10-CM

## 2014-12-10 NOTE — Telephone Encounter (Signed)
error 

## 2015-01-27 ENCOUNTER — Ambulatory Visit (HOSPITAL_COMMUNITY)
Admission: RE | Admit: 2015-01-27 | Discharge: 2015-01-27 | Disposition: A | Discharge status: Home / Self Care | Payer: Managed Care, Other (non HMO) | Source: Ambulatory Visit | Attending: Family Medicine | Admitting: Family Medicine

## 2015-01-27 DIAGNOSIS — Z1231 Encounter for screening mammogram for malignant neoplasm of breast: Secondary | ICD-10-CM | POA: Diagnosis present

## 2015-04-09 ENCOUNTER — Other Ambulatory Visit: Payer: Managed Care, Other (non HMO)

## 2015-04-13 ENCOUNTER — Ambulatory Visit: Payer: Managed Care, Other (non HMO) | Admitting: Internal Medicine

## 2015-09-20 ENCOUNTER — Other Ambulatory Visit: Payer: Self-pay | Admitting: Endocrinology

## 2015-12-28 ENCOUNTER — Other Ambulatory Visit: Payer: Self-pay | Admitting: Family Medicine

## 2015-12-28 DIAGNOSIS — Z1231 Encounter for screening mammogram for malignant neoplasm of breast: Secondary | ICD-10-CM

## 2016-01-29 ENCOUNTER — Ambulatory Visit
Admission: RE | Admit: 2016-01-29 | Discharge: 2016-01-29 | Disposition: A | Discharge status: Home / Self Care | Payer: No Typology Code available for payment source | Source: Ambulatory Visit | Attending: Family Medicine | Admitting: Family Medicine

## 2016-01-29 DIAGNOSIS — Z1231 Encounter for screening mammogram for malignant neoplasm of breast: Secondary | ICD-10-CM

## 2016-06-22 MED FILL — AMLODIPINE BESYLATE 5 MG TA: 5 | 90 days supply | Qty: 90 | Fill #0

## 2016-06-23 MED FILL — METOPROLOL SUCC ER 100 MG T: 100 | 90 days supply | Qty: 90 | Fill #0

## 2016-07-05 ENCOUNTER — Telehealth: Payer: Self-pay | Admitting: Internal Medicine

## 2016-07-05 NOTE — Telephone Encounter (Signed)
LM for pt to call back.

## 2016-07-05 NOTE — Telephone Encounter (Signed)
-----   Message from Philemon Kingdom, MD sent at 06/30/2016 11:48 AM EDT ----- Regarding: RE: pt wants to switch to Joella Prince, C ----- Message -----    From: Armen Pickup    Sent: 06/30/2016  11:07 AM      To: Renato Shin, MD, Brunilda Payor, # Subject: pt wants to switch to [facility]                  Pt is asking to switch from Dr. Loanne Drilling last seen in Nov 2015  Pt # [phone]

## 2016-08-24 ENCOUNTER — Encounter: Payer: Self-pay | Admitting: Internal Medicine

## 2016-08-24 ENCOUNTER — Ambulatory Visit (INDEPENDENT_AMBULATORY_CARE_PROVIDER_SITE_OTHER): Payer: Self-pay | Admitting: Internal Medicine

## 2016-08-24 VITALS — BP 140/84 | HR 92 | Wt 160.0 lb

## 2016-08-24 DIAGNOSIS — E0590 Thyrotoxicosis, unspecified without thyrotoxic crisis or storm: Secondary | ICD-10-CM

## 2016-08-24 LAB — TSH: TSH: 0.38 u[IU]/mL (ref 0.35–4.50)

## 2016-08-24 LAB — T4, FREE: Free T4: 2.48 ng/dL — ABNORMAL HIGH (ref 0.60–1.60)

## 2016-08-24 LAB — T3, FREE: T3, Free: 8.3 pg/mL — ABNORMAL HIGH (ref 2.3–4.2)

## 2016-08-24 NOTE — Progress Notes (Addendum)
Patient ID: Samantha Peck, female   DOB: May 17, 1966, 50 y.o.   MRN: [id number]    HPI  Samantha Peck is a 50 y.o.-year-old female, referred by her PCP, Dr. Redmond Pulling, for evaluation for thyrotoxicosis, likely secondary to Graves' disease. She prev. saw Dr. Loanne Drilling, last visit 10/2014.  She was dx with Graves ds. In 2010 (lost 15 lbs then)  >> started MMI >> but not compliant 2/2 lack of insurance >>  now off the med completely since 11/2015. She now has insurance.   I reviewed pt's thyroid tests: No labs since ~ 2 years ago... Lab Results  Component Value Date   TSH 1.65 10/08/2014   TSH 1.68 04/09/2014   TSH 3.310 10/09/2013   TSH 1.12 06/05/2013   TSH 0.78 04/03/2013   TSH 0.62 02/20/2013   TSH 4.367 01/01/2013   TSH 0.51 11/21/2012   TSH 0.23 (L) 10/12/2012   TSH <0.01 (L) 09/14/2012   FREET4 1.00 10/08/2014   FREET4 0.82 04/09/2014   FREET4 1.02 10/09/2013   FREET4 0.82 06/05/2013   FREET4 0.81 04/03/2013   FREET4 0.89 02/20/2013   FREET4 0.94 01/01/2013   FREET4 0.94 11/21/2012   FREET4 0.66 10/12/2012   FREET4 1.30 07/13/2012     Pt denies feeling nodules in neck, hoarseness, dysphagia/odynophagia, SOB with lying down; she c/o: - + fatigue - + weight gain - no excessive sweating/heat intolerance - no tremors - no anxiety - no palpitations - no hyperdefecation, + constipation - no hair loss  Pt does not have FH of thyroid ds. No FH of thyroid cancer. No h/o radiation tx to head or neck.  No seaweed or kelp, no recent contrast studies. No steroid use. No herbal supplements. No Biotin use.  ROS: Constitutional: + see HPI, + nocturia Eyes: no blurry vision, no xerophthalmia ENT: no sore throat, no nodules palpated in throat, no dysphagia/odynophagia, no hoarseness Cardiovascular: no CP/SOB/palpitations/leg swelling Respiratory: no cough/SOB Gastrointestinal: no N/V/D/C Musculoskeletal: no muscle/joint aches Skin: no rashes Neurological: no  tremors/numbness/tingling/dizziness Psychiatric: no depression/anxiety  Past Medical History:  Diagnosis Date  . Thyroid disease    Past Surgical History:  Procedure Laterality Date  . BUNIONECTOMY  1996   bilateral   Social History   Social History  . Marital status: Married    Spouse name: N/A  . Number of children: 3  . 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 Smoking status: Current Some Day Smoker    Types: Cigarettes  . Smokeless tobacco: Never Used     Comment: 1-2 cigarettes once in a while  . Alcohol use Yes  . Drug use: No  . Sexual activity: Not on file   Other Topics Concern  . Not on file   Social History Narrative   Regular exercise-no   Caffeine Use-yes   Current Outpatient Prescriptions on File Prior to Visit  Medication Sig Dispense Refill  . clonazePAM (KLONOPIN) 0.5 MG tablet     . methimazole (TAPAZOLE) 10 MG tablet Take 0.5 tablets (5 mg total) by mouth daily. **PT NEEDS APPT FOR FURTHER REFILLS** (Patient not taking: Reported on 08/24/2016) 30 tablet 0  . metoprolol succinate (TOPROL-XL) 25 MG 24 hr tablet Take 25 mg by mouth as needed. 1/2 tab daily    . triamcinolone cream (KENALOG) 0.1 % Apply 1 application topically 3 (three) times daily. As needed for itching (Patient not taking: Reported on 08/24/2016) 45 g  1   No current facility-administered medications on file prior to visit.    No Known Allergies Family History  Problem Relation Age of Onset  . Cancer Mother     Breast Cancer  . Diabetes Mother   . Heart disease Mother   . Hypertension Mother   . Cancer Maternal Grandmother     Breast Cancer    PE: BP 140/84 (BP Location: Left Arm, Patient Position: Sitting)   Pulse 92   Wt 160 lb (72.6 kg)   LMP 08/05/2016   SpO2 96%   BMI 25.06 kg/m  Wt Readings from Last 3 Encounters:  08/24/16 160 lb (72.6 kg)  10/13/14 147 lb (66.7 kg)  04/11/14 149 lb (67.6 kg)    Constitutional: overweight, in NAD Eyes: PERRLA, EOMI, no exophthalmos, no lid lag, no stare ENT: moist mucous membranes, no thyromegaly, no thyroid bruits, no cervical lymphadenopathy Cardiovascular: RRR, No MRG Respiratory: CTA B Gastrointestinal: abdomen soft, NT, ND, BS+ Musculoskeletal: no deformities, strength intact in all 4 Skin: moist, warm, no rashes Neurological: no tremor with outstretched hands, DTR normal in all 4  ASSESSMENT: 1. Thyrotoxicosis - likely Graves ds.  PLAN:  1. Patient with a history of thyrotoxicosis, most likely Graves' disease, with initial thyrotoxic sxs, now all resolved, but with hypothyroid symptoms: Weight gain, fatigue, constipation. The symptoms improved after she came off methimazole, but not completely. She is wondering whether she could have become hypothyroid. This is definitely a possibility. - We will check TSH, fT3 and fT4 and also add thyroid stimulating antibodies to demonstrate Graves' disease. However, I explained that if the antibodies are negative, it may mean that her Graves' disease in remission. - I do not feel that we need to add beta blockers at this time, since she is not tachycardic (HR in the 80s the end of the appt), anxious, or tremulous - RTC in 4 months, but possibly sooner for repeat labs  Component     Latest Ref Rng & Units 08/24/2016  TSH     0.35 - 4.50 uIU/mL 0.38  T4,Free(Direct)     0.60 - 1.60 ng/dL 2.48 (H)  Triiodothyronine,Free,Serum     2.3 - 4.2 pg/mL 8.3 (H)  TSI     <140 % baseline 258 (H)   TSH is borderline normal, however, free T4 and free T3 are high. Her TSI antibodies are also high, confirming active Graves disease. I would suggest to restart methimazole, low dose, 5 mg daily and recheck her tests in 6-8 weeks.  Philemon Kingdom, MD PhD [facility]

## 2016-08-24 NOTE — Patient Instructions (Signed)
Please stop at the lab.  For now, stay off Methimazole.  Please come back for a follow-up appointment in 4 months.

## 2016-08-28 LAB — THYROID STIMULATING IMMUNOGLOBULIN: TSI: 258 % baseline — ABNORMAL HIGH (ref <140)

## 2016-08-30 MED ORDER — METHIMAZOLE 5 MG PO TABS: 5.0000 mg | ORAL_TABLET | Freq: Every day | ORAL | 2 refills | Status: DC

## 2016-08-30 MED FILL — methIMAzole 5 MG TABS: 5 | 60 days supply | Qty: 60 | Fill #0

## 2016-09-26 MED FILL — AMLODIPINE BESYLATE 5 MG TA: 5 | 90 days supply | Qty: 90 | Fill #1

## 2016-09-27 MED FILL — METOPROLOL SUCC ER 100 MG T: 100 | 90 days supply | Qty: 90 | Fill #0

## 2016-10-17 DIAGNOSIS — D485 Neoplasm of uncertain behavior of skin: Secondary | ICD-10-CM | POA: Diagnosis not present

## 2016-10-17 DIAGNOSIS — Z23 Encounter for immunization: Secondary | ICD-10-CM | POA: Diagnosis not present

## 2016-10-17 DIAGNOSIS — C44319 Basal cell carcinoma of skin of other parts of face: Secondary | ICD-10-CM | POA: Diagnosis not present

## 2016-10-25 ENCOUNTER — Other Ambulatory Visit (INDEPENDENT_AMBULATORY_CARE_PROVIDER_SITE_OTHER): Payer: 59

## 2016-10-25 DIAGNOSIS — E0590 Thyrotoxicosis, unspecified without thyrotoxic crisis or storm: Secondary | ICD-10-CM

## 2016-10-25 LAB — T4, FREE: Free T4: 4.98 ng/dL — ABNORMAL HIGH (ref 0.60–1.60)

## 2016-10-25 LAB — T3, FREE: T3, Free: 16.9 pg/mL — ABNORMAL HIGH (ref 2.3–4.2)

## 2016-10-25 LAB — TSH: TSH: 0.63 u[IU]/mL (ref 0.35–4.50)

## 2016-10-26 ENCOUNTER — Encounter: Payer: Self-pay | Admitting: Internal Medicine

## 2016-10-27 ENCOUNTER — Other Ambulatory Visit: Payer: Self-pay

## 2016-10-27 ENCOUNTER — Telehealth: Payer: Self-pay | Admitting: Internal Medicine

## 2016-10-27 MED ORDER — METHIMAZOLE 5 MG PO TABS: 5.0000 mg | ORAL_TABLET | Freq: Two times a day (BID) | ORAL | 0 refills | Status: DC

## 2016-10-27 MED FILL — methIMAzole 5 MG TABS: 5 | 45 days supply | Qty: 90 | Fill #0

## 2016-10-27 NOTE — Telephone Encounter (Signed)
Please send the increased dosage of the Methimazole to the [facility].

## 2016-10-27 NOTE — Telephone Encounter (Signed)
Sent!

## 2016-10-28 DIAGNOSIS — E0500 Thyrotoxicosis with diffuse goiter without thyrotoxic crisis or storm: Secondary | ICD-10-CM | POA: Diagnosis not present

## 2016-10-28 DIAGNOSIS — Z0000 Encounter for general adult medical examination without abnormal findings: Secondary | ICD-10-CM | POA: Diagnosis not present

## 2016-10-31 MED FILL — LOSARTAN POTASSIUM 50 MG TA: 50 | 90 days supply | Qty: 90 | Fill #0

## 2016-11-01 MED FILL — CHLORTHALIDONE 25 MG TABLET: 25 | 60 days supply | Qty: 30 | Fill #0 | Status: TO

## 2016-11-26 ENCOUNTER — Encounter (HOSPITAL_COMMUNITY): Payer: Self-pay | Admitting: Emergency Medicine

## 2016-11-26 ENCOUNTER — Ambulatory Visit (HOSPITAL_COMMUNITY)
Admission: EM | Admit: 2016-11-26 | Discharge: 2016-11-26 | Disposition: A | Discharge status: Home / Self Care | Payer: 59 | Attending: Family Medicine | Admitting: Family Medicine

## 2016-11-26 DIAGNOSIS — J40 Bronchitis, not specified as acute or chronic: Secondary | ICD-10-CM

## 2016-11-26 DIAGNOSIS — R05 Cough: Secondary | ICD-10-CM | POA: Diagnosis not present

## 2016-11-26 DIAGNOSIS — R059 Cough, unspecified: Secondary | ICD-10-CM

## 2016-11-26 HISTORY — DX: Thyrotoxicosis with diffuse goiter without thyrotoxic crisis or storm: E05.00

## 2016-11-26 MED ORDER — BENZONATATE 100 MG PO CAPS: 200.0000 mg | ORAL_CAPSULE | Freq: Three times a day (TID) | ORAL | 0 refills | Status: DC | PRN

## 2016-11-26 MED ORDER — METHYLPREDNISOLONE 4 MG PO TBPK: ORAL_TABLET | ORAL | 0 refills | Status: DC

## 2016-11-26 MED ORDER — AZITHROMYCIN 250 MG PO TABS: 250.0000 mg | ORAL_TABLET | Freq: Every day | ORAL | 0 refills | Status: DC

## 2016-11-26 NOTE — ED Provider Notes (Signed)
CSN: [id number]     Arrival date & time 11/26/16  1227 History   First MD Initiated Contact with Patient 11/26/16 1414     Chief Complaint  Patient presents with  . URI   (Consider location/radiation/quality/duration/timing/severity/associated sxs/prior Treatment) Patient c/o cough and wheezing for a week.  She is having a lot of coughing and she does smoke on occasion.   The history is provided by the patient.  URI  Presenting symptoms: congestion, cough, fatigue and rhinorrhea   Severity:  Moderate Onset quality:  Sudden Duration:  1 week Timing:  Constant Progression:  Waxing and waning Chronicity:  New Relieved by:  Nothing Worsened by:  Nothing   Past Medical History:  Diagnosis Date  . Graves disease   . Thyroid disease    Past Surgical History:  Procedure Laterality Date  . BUNIONECTOMY  1996   bilateral   Family History  Problem Relation Age of Onset  . Cancer Mother     Breast Cancer  . Diabetes Mother   . Heart disease Mother   . Hypertension Mother   . Cancer Maternal Grandmother     Breast Cancer   Social History  Substance Use Topics  . Smoking status: Current Some Day Smoker    Types: Cigarettes  . Smokeless tobacco: Never Used     Comment: 1-2 cigarettes once in a while  . Alcohol use Yes   OB History    No data available     Review of Systems  Constitutional: Positive for fatigue.  HENT: Positive for congestion and rhinorrhea.   Eyes: Negative.   Respiratory: Positive for cough.   Cardiovascular: Negative.   Gastrointestinal: Negative.   Endocrine: Negative.   Genitourinary: Negative.   Skin: Negative.   Allergic/Immunologic: Negative.   Neurological: Negative.   Hematological: Negative.   Psychiatric/Behavioral: Negative.     Allergies  Patient has no known allergies.  Home Medications   Prior to Admission medications   Medication Sig Start Date End Date Taking? Authorizing Provider  losartan (COZAAR) 50 MG tablet Take  50 mg by mouth daily.   Yes Historical Provider, MD  Pseudoeph-Doxylamine-DM-APAP (NYQUIL PO) Take by mouth.   Yes Historical Provider, MD  AMLODIPINE BESYLATE PO Take 5 mg by mouth.    Historical Provider, MD  azithromycin (ZITHROMAX) 250 MG tablet Take 1 tablet (250 mg total) by mouth daily. Take first 2 tablets together, then 1 every day until finished. 11/26/16   Lysbeth Penner, FNP  benzonatate (TESSALON) 100 MG capsule Take 2 capsules (200 mg total) by mouth 3 (three) times daily as needed for cough. 11/26/16   Lysbeth Penner, FNP  clonazePAM (KLONOPIN) 0.5 MG tablet  03/17/14   Historical Provider, MD  methimazole (TAPAZOLE) 5 MG tablet Take 1 tablet (5 mg total) by mouth 2 (two) times daily. 10/27/16   Philemon Kingdom, MD  methylPREDNISolone (MEDROL DOSEPAK) 4 MG TBPK tablet Take 6-5-4-3-2-1 po qd 11/26/16   Lysbeth Penner, FNP  metoprolol succinate (TOPROL-XL) 100 MG 24 hr tablet  06/23/16   Historical Provider, MD  metoprolol succinate (TOPROL-XL) 25 MG 24 hr tablet Take 25 mg by mouth as needed. 1/2 tab daily 08/29/12 08/29/13  Renato Shin, MD  triamcinolone cream (KENALOG) 0.1 % Apply 1 application topically 3 (three) times daily. As needed for itching Patient not taking: Reported on 08/24/2016 04/11/14   Renato Shin, MD   Meds Ordered and Administered this Visit  Medications - No data to display  BP  125/84 (BP Location: Left Arm)   Pulse 104   Temp 98.2 F (36.8 C) (Oral)   Resp 22   LMP 11/19/2016   SpO2 99%  No data found.   Physical Exam  Constitutional: She appears well-developed and well-nourished.  HENT:  Head: Normocephalic.  Right Ear: External ear normal.  Left Ear: External ear normal.  Mouth/Throat: Oropharynx is clear and moist.  Eyes: Conjunctivae and EOM are normal. Pupils are equal, round, and reactive to light.  Neck: Normal range of motion. Neck supple.  Cardiovascular: Normal rate, regular rhythm and normal heart sounds.   Pulmonary/Chest: Effort  normal and breath sounds normal.  Abdominal: Soft. Bowel sounds are normal.  Nursing note and vitals reviewed.   Urgent Care Course   Clinical Course     Procedures (including critical care time)  Labs Review Labs Reviewed - No data to display  Imaging Review No results found.   Visual Acuity Review  Right Eye Distance:   Left Eye Distance:   Bilateral Distance:    Right Eye Near:   Left Eye Near:    Bilateral Near:         MDM   1. Bronchitis   2. Cough    zpak Tessalon Medrol dose pack  Push po fluids, rest, tylenol and motrin otc prn as directed for fever, arthralgias, and myalgias.  Follow up prn if sx's continue or persist.    Lysbeth Penner, FNP 11/26/16 [phone]

## 2016-11-26 NOTE — ED Triage Notes (Signed)
Cough for a week, chest burning.  Phlegm was clear, then white.  No known fever.  complains of sore throat.  No family members sick.  Patient works IT in hospital

## 2016-11-30 DIAGNOSIS — Z85828 Personal history of other malignant neoplasm of skin: Secondary | ICD-10-CM | POA: Diagnosis not present

## 2016-11-30 DIAGNOSIS — C44319 Basal cell carcinoma of skin of other parts of face: Secondary | ICD-10-CM | POA: Diagnosis not present

## 2016-11-30 MED FILL — DOXYCYCLINE HYCLATE 100 MG: 100 | 5 days supply | Qty: 10 | Fill #0

## 2016-12-01 ENCOUNTER — Other Ambulatory Visit (INDEPENDENT_AMBULATORY_CARE_PROVIDER_SITE_OTHER): Payer: 59

## 2016-12-01 ENCOUNTER — Telehealth: Payer: Self-pay | Admitting: *Deleted

## 2016-12-01 DIAGNOSIS — E0590 Thyrotoxicosis, unspecified without thyrotoxic crisis or storm: Secondary | ICD-10-CM

## 2016-12-01 LAB — T3, FREE: T3, Free: 7 pg/mL — ABNORMAL HIGH (ref 2.3–4.2)

## 2016-12-01 LAB — TSH: TSH: 0.01 u[IU]/mL — ABNORMAL LOW (ref 0.35–4.50)

## 2016-12-01 LAB — T4, FREE: Free T4: 2.54 ng/dL — ABNORMAL HIGH (ref 0.60–1.60)

## 2016-12-01 NOTE — Telephone Encounter (Signed)
Received call from [place] at [facility] lab stating that the pt was there to have lab drawn but there was not lab entered.  Placed future orders and sent.//AB/CMA

## 2016-12-02 ENCOUNTER — Encounter: Payer: Self-pay | Admitting: Internal Medicine

## 2016-12-02 MED ORDER — METHIMAZOLE 5 MG PO TABS: ORAL_TABLET | ORAL | 0 refills | Status: DC

## 2016-12-09 ENCOUNTER — Other Ambulatory Visit: Payer: Self-pay | Admitting: Internal Medicine

## 2016-12-13 MED FILL — methIMAzole 5 MG TABS: 5 | 45 days supply | Qty: 90 | Fill #0

## 2016-12-13 NOTE — Telephone Encounter (Signed)
Refill of methimazole (TAPAZOLE) 5 MG tablet  Send to   [place], [place] - [place] [phone] (Phone) [phone] (Fax)

## 2016-12-23 ENCOUNTER — Ambulatory Visit (INDEPENDENT_AMBULATORY_CARE_PROVIDER_SITE_OTHER): Payer: 59 | Admitting: Internal Medicine

## 2016-12-23 ENCOUNTER — Encounter: Payer: Self-pay | Admitting: Internal Medicine

## 2016-12-23 VITALS — BP 108/70 | HR 96 | Wt 152.0 lb

## 2016-12-23 DIAGNOSIS — E0500 Thyrotoxicosis with diffuse goiter without thyrotoxic crisis or storm: Secondary | ICD-10-CM

## 2016-12-23 LAB — T3, FREE: T3, Free: 5 pg/mL — ABNORMAL HIGH (ref 2.3–4.2)

## 2016-12-23 LAB — T4, FREE: Free T4: 1.43 ng/dL (ref 0.60–1.60)

## 2016-12-23 LAB — TSH: TSH: 0.07 u[IU]/mL — ABNORMAL LOW (ref 0.35–4.50)

## 2016-12-23 NOTE — Progress Notes (Signed)
Patient ID: Samantha Peck, female   DOB: 1966-05-04, 51 y.o.   MRN: [id number]    HPI  Samantha Peck is a 51 y.o.-year-old female, referred by her PCP, Dr. Redmond Pulling, for evaluation for thyrotoxicosis, likely secondary to Graves' disease. She prev. saw Dr. Loanne Drilling, last visit 10/2014. Last visit with me 4 mo ago.  She had bronchitis before Christmas >> was on ABx and Prednisone taper.  She was dx with Graves ds. In 2010 (lost 15 lbs then)  >> started MMI >> but not compliant 2/2 lack of insurance >>  off the med completely since 11/2015. She started to have insurance again last year >> we restarted MMI at last visit, 5 mg daily >> subsequent TSH was low >> we increased MMI to 10 mg in am and 5 mg in pm. She feels a little better.  I reviewed pt's thyroid tests: Lab Results  Component Value Date   TSH 0.01 (L) 12/01/2016   TSH 0.63 10/25/2016   TSH 0.38 08/24/2016   TSH 1.65 10/08/2014   TSH 1.68 04/09/2014   TSH 3.310 10/09/2013   TSH 1.12 06/05/2013   TSH 0.78 04/03/2013   TSH 0.62 02/20/2013   TSH 4.367 01/01/2013   FREET4 2.54 (H) 12/01/2016   FREET4 4.98 (H) 10/25/2016   FREET4 2.48 (H) 08/24/2016   FREET4 1.00 10/08/2014   FREET4 0.82 04/09/2014   FREET4 1.02 10/09/2013   FREET4 0.82 06/05/2013   FREET4 0.81 04/03/2013   FREET4 0.89 02/20/2013   FREET4 0.94 01/01/2013    Component     Latest Ref Rng & Units 08/24/2016  TSI     <140 % baseline 258 (H)    Pt denies feeling nodules in neck, hoarseness, dysphagia/odynophagia, SOB with lying down; she c/o: - + fatigue, + insomnia - + tremors - + weight loss - no excessive sweating/heat intolerance - no anxiety - no palpitations - no hyperdefecation, no constipation - no hair loss  Pt does not have FH of thyroid ds. No FH of thyroid cancer. No h/o radiation tx to head or neck.  No seaweed or kelp, no recent contrast studies. No steroid use. No herbal supplements. No Biotin use.  ROS: Constitutional: + see HPI, no  nocturia Eyes: no blurry vision, no xerophthalmia ENT: no sore throat, no nodules palpated in throat, + dysphagia/no odynophagia, no hoarseness Cardiovascular: no CP/SOB/palpitations/leg swelling Respiratory: no cough/SOB Gastrointestinal: no N/V/D/C Musculoskeletal: no muscle/joint aches Skin: no rashes Neurological: no tremors/numbness/tingling/dizziness  I reviewed pt's medications, allergies, PMH, social hx, family hx, and changes were documented in the history of present illness. Otherwise, unchanged from my initial visit note.  Past Medical History:  Diagnosis Date  . Graves disease   . Thyroid disease    Past Surgical History:  Procedure Laterality Date  . BUNIONECTOMY  1996   bilateral   Social History   Social History  . Marital status: Married    Spouse name: N/A  . Number of children: 3  . 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 Smoking status: Current Some Day Smoker    Types: Cigarettes  . Smokeless tobacco: Never Used     Comment: 1-2 cigarettes once in a while  . Alcohol use Yes  . Drug use: No  . Sexual activity: Not on file   Other Topics Concern  . Not on file   Social History Narrative   Regular exercise-no  Caffeine Use-yes   No Known Allergies Family History  Problem Relation Age of Onset  . Cancer Mother     Breast Cancer  . Diabetes Mother   . Heart disease Mother   . Hypertension Mother   . Cancer Maternal Grandmother     Breast Cancer   Current Outpatient Prescriptions  Medication Sig Dispense Refill  . losartan (COZAAR) 50 MG tablet Take 50 mg by mouth daily.    . methimazole (TAPAZOLE) 5 MG tablet Take 10 mg in a.m. and 5 mg in p.m. 90 tablet 0  . metoprolol succinate (TOPROL-XL) 100 MG 24 hr tablet   0  . AMLODIPINE BESYLATE PO Take 5 mg by mouth.    . clonazePAM (KLONOPIN) 0.5 MG tablet     . Pseudoeph-Doxylamine-DM-APAP (NYQUIL PO) Take by mouth.    .  triamcinolone cream (KENALOG) 0.1 % Apply 1 application topically 3 (three) times daily. As needed for itching (Patient not taking: Reported on 12/23/2016) 45 g 1   No current facility-administered medications for this visit.     PE: BP 108/70 (BP Location: Left Arm, Patient Position: Sitting)   Pulse 96   Wt 152 lb (68.9 kg)   LMP 11/07/2016   SpO2 97%   BMI 23.81 kg/m  Wt Readings from Last 3 Encounters:  12/23/16 152 lb (68.9 kg)  08/24/16 160 lb (72.6 kg)  10/13/14 147 lb (66.7 kg)   Constitutional: normal weight, in NAD Eyes: PERRLA, EOMI, no exophthalmos, no lid lag, no stare ENT: moist mucous membranes, + symmetric slight thyromegaly, no cervical lymphadenopathy Cardiovascular: tachycardia, RR, No MRG Respiratory: CTA B Gastrointestinal: abdomen soft, NT, ND, BS+ Musculoskeletal: no deformities, strength intact in all 4 Skin: moist, warm, no rashes Neurological: + tremor with outstretched hands, DTR normal in all 4  ASSESSMENT: 1. Thyrotoxicosis - likely Graves ds.  PLAN:  1. Patient with a history of thyrotoxicosis, most likely Graves' disease, with initial thyrotoxic sxs, then all resolved, but with recurring thyrotoxic sxs off MMI >> we restarted MMI at last visit, now on 10 mg in am and 5 mg in pm.  - Will repeat TFTs in 1 week, 1 mo after previous - We discussed about RAI tx, which I suggested. Discussed protocol and post radioactivity precautions. She will think about it, but for now, she refuses this. - She is on 100 mg Toprol XL >> continue  - RTC in 4 months, but likely sooner for repeat labs  Component     Latest Ref Rng & Units 12/01/2016 12/23/2016  TSH     0.35 - 4.50 uIU/mL 0.01 (L) 0.07 (L)  Triiodothyronine,Free,Serum     2.3 - 4.2 pg/mL 7.0 (H) 5.0 (H)  T4,Free(Direct)     0.60 - 1.60 ng/dL 2.54 (H) 1.43   TFTs are improving. I would suggest to continue the current dose of methimazole and repeat the labs in 5-6 weeks.  Philemon Kingdom, MD  PhD [facility]

## 2016-12-23 NOTE — Patient Instructions (Addendum)
Please stop at the lab.  Please continue Methimazole 10 mg in am and 5 mg in pm.  Come back for labs in 1 week.  Please come back for a follow-up appointment in 4 months.

## 2017-01-02 MED FILL — METOPROLOL SUCC ER 100 MG T: 100 | 90 days supply | Qty: 90 | Fill #1

## 2017-01-09 ENCOUNTER — Other Ambulatory Visit: Payer: Self-pay | Admitting: Family Medicine

## 2017-01-09 DIAGNOSIS — Z1231 Encounter for screening mammogram for malignant neoplasm of breast: Secondary | ICD-10-CM

## 2017-01-13 ENCOUNTER — Other Ambulatory Visit: Payer: Self-pay | Admitting: Internal Medicine

## 2017-01-16 ENCOUNTER — Other Ambulatory Visit: Payer: Self-pay | Admitting: Internal Medicine

## 2017-01-23 MED FILL — methIMAzole 5 MG TABS: 5 | 45 days supply | Qty: 90 | Fill #0 | Status: TO

## 2017-01-30 MED FILL — LOSARTAN POTASSIUM 50 MG TA: 50 | 90 days supply | Qty: 90 | Fill #0 | Status: TO

## 2017-02-02 ENCOUNTER — Other Ambulatory Visit (INDEPENDENT_AMBULATORY_CARE_PROVIDER_SITE_OTHER): Payer: 59

## 2017-02-02 DIAGNOSIS — E0500 Thyrotoxicosis with diffuse goiter without thyrotoxic crisis or storm: Secondary | ICD-10-CM | POA: Diagnosis not present

## 2017-02-02 LAB — T3, FREE: T3, Free: 5.5 pg/mL — ABNORMAL HIGH (ref 2.3–4.2)

## 2017-02-02 LAB — TSH: TSH: <0.01 u[IU]/mL — ABNORMAL LOW (ref 0.35–4.50)

## 2017-02-02 LAB — T4, FREE: Free T4: 1.03 ng/dL (ref 0.60–1.60)

## 2017-02-03 ENCOUNTER — Other Ambulatory Visit: Payer: Self-pay | Admitting: Internal Medicine

## 2017-02-03 ENCOUNTER — Encounter: Payer: Self-pay | Admitting: Internal Medicine

## 2017-02-03 DIAGNOSIS — E0500 Thyrotoxicosis with diffuse goiter without thyrotoxic crisis or storm: Secondary | ICD-10-CM

## 2017-02-03 MED ORDER — METHIMAZOLE 5 MG PO TABS: ORAL_TABLET | ORAL | 1 refills | Status: DC

## 2017-02-07 ENCOUNTER — Ambulatory Visit
Admission: RE | Admit: 2017-02-07 | Discharge: 2017-02-07 | Disposition: A | Discharge status: Home / Self Care | Payer: 59 | Source: Ambulatory Visit | Attending: Family Medicine | Admitting: Family Medicine

## 2017-02-07 DIAGNOSIS — Z1231 Encounter for screening mammogram for malignant neoplasm of breast: Secondary | ICD-10-CM

## 2017-02-08 DIAGNOSIS — H524 Presbyopia: Secondary | ICD-10-CM | POA: Diagnosis not present

## 2017-02-09 ENCOUNTER — Other Ambulatory Visit: Payer: Self-pay | Admitting: Family Medicine

## 2017-02-09 DIAGNOSIS — R928 Other abnormal and inconclusive findings on diagnostic imaging of breast: Secondary | ICD-10-CM

## 2017-02-13 ENCOUNTER — Ambulatory Visit
Admission: RE | Admit: 2017-02-13 | Discharge: 2017-02-13 | Disposition: A | Discharge status: Home / Self Care | Payer: 59 | Source: Ambulatory Visit | Attending: Family Medicine | Admitting: Family Medicine

## 2017-02-13 DIAGNOSIS — R928 Other abnormal and inconclusive findings on diagnostic imaging of breast: Secondary | ICD-10-CM | POA: Diagnosis not present

## 2017-02-13 DIAGNOSIS — N6489 Other specified disorders of breast: Secondary | ICD-10-CM | POA: Diagnosis not present

## 2017-02-27 ENCOUNTER — Other Ambulatory Visit: Payer: Self-pay | Admitting: Internal Medicine

## 2017-02-28 MED FILL — methIMAzole 5 MG TABS: 5 | 45 days supply | Qty: 90 | Fill #0

## 2017-02-28 MED FILL — CHLORTHALIDONE 25 MG TABLET: 25 | 60 days supply | Qty: 30 | Fill #0

## 2017-03-13 DIAGNOSIS — J019 Acute sinusitis, unspecified: Secondary | ICD-10-CM | POA: Diagnosis not present

## 2017-03-13 MED FILL — FLUTICASONE PROP 50 MCG SPR: 50 | 30 days supply | Qty: 16 | Fill #0

## 2017-03-17 ENCOUNTER — Other Ambulatory Visit (INDEPENDENT_AMBULATORY_CARE_PROVIDER_SITE_OTHER): Payer: 59

## 2017-03-17 DIAGNOSIS — E0500 Thyrotoxicosis with diffuse goiter without thyrotoxic crisis or storm: Secondary | ICD-10-CM

## 2017-03-17 LAB — T3, FREE: T3, Free: 4.4 pg/mL — ABNORMAL HIGH (ref 2.3–4.2)

## 2017-03-17 LAB — TSH: TSH: <0.01 u[IU]/mL — ABNORMAL LOW (ref 0.35–4.50)

## 2017-03-17 LAB — T4, FREE: Free T4: 0.71 ng/dL (ref 0.60–1.60)

## 2017-03-27 ENCOUNTER — Other Ambulatory Visit: Payer: Self-pay | Admitting: Internal Medicine

## 2017-04-04 ENCOUNTER — Telehealth: Payer: Self-pay | Admitting: Internal Medicine

## 2017-04-04 ENCOUNTER — Other Ambulatory Visit: Payer: Self-pay

## 2017-04-04 ENCOUNTER — Telehealth: Payer: Self-pay

## 2017-04-04 MED ORDER — METHIMAZOLE 5 MG PO TABS: ORAL_TABLET | ORAL | 1 refills | Status: DC

## 2017-04-04 MED FILL — methIMAzole 5 MG TABS: 5 | 30 days supply | Qty: 90 | Fill #0

## 2017-04-04 NOTE — Telephone Encounter (Signed)
[facility] Call: Caller states she called in her Rx for a second or third time, the Rx is wrong and is needing to be changed and corrected.

## 2017-04-04 NOTE — Telephone Encounter (Signed)
Called and notified patient I had submitted the correct dosage.

## 2017-04-04 NOTE — Telephone Encounter (Signed)
Patient returning phone call. Patient states that medication still says 2 tablets a day and it is supposed to be 3 tablets a day. Okay to leave a detailed message.

## 2017-04-04 NOTE — Telephone Encounter (Signed)
Submitted correct dosage after reading the last email from [place] to patient.

## 2017-04-19 MED FILL — DENTA 5000 PLUS CREAM: 1.1 | 60 days supply | Qty: 102 | Fill #0

## 2017-04-21 ENCOUNTER — Encounter: Payer: Self-pay | Admitting: Internal Medicine

## 2017-04-21 ENCOUNTER — Ambulatory Visit (INDEPENDENT_AMBULATORY_CARE_PROVIDER_SITE_OTHER): Payer: 59 | Admitting: Internal Medicine

## 2017-04-21 VITALS — BP 102/62 | HR 69 | Ht 67.0 in | Wt 144.0 lb

## 2017-04-21 DIAGNOSIS — E0500 Thyrotoxicosis with diffuse goiter without thyrotoxic crisis or storm: Secondary | ICD-10-CM

## 2017-04-21 NOTE — Patient Instructions (Signed)
Please continue Methimazole 10 mg in am and 5 mg in pm.  Come back for labs in 2 weeks.  Talk to your PCP about decreasing your BP medicines.

## 2017-04-21 NOTE — Progress Notes (Signed)
Patient ID: Samantha Peck, female   DOB: 04/16/1966, 51 y.o.   MRN: [id number]    HPI  Samantha Peck is a 51 y.o.-year-old female, referred by her PCP, Dr. Redmond Pulling, for evaluation for thyrotoxicosis, likely secondary to Graves' disease. She prev. saw Dr. Loanne Drilling, last visit 10/2014. Last visit with me 4 mo ago.  Reviewed and addended hx: She was dx with Graves ds. In 2010 (lost 15 lbs then)  >> started MMI >> but not compliant 2/2 lack of insurance >>  off the med completely since 11/2015. She started to have insurance again last year >> we restarted MMI.  Last dose change was to 10 mg in am and 5 mg in pm in 01/2017 >> TFTs improved afterwards.  She was off MMI x 4 days (ran out - pb with pharmacy) 2 weeks ago. Now back on above dose.  I reviewed pt's thyroid tests: Lab Results  Component Value Date   TSH <0.01 Repeated and verified X2. (L) 03/17/2017   TSH <0.01 (L) 02/02/2017   TSH 0.07 (L) 12/23/2016   TSH 0.01 (L) 12/01/2016   TSH 0.63 10/25/2016   TSH 0.38 08/24/2016   TSH 1.65 10/08/2014   TSH 1.68 04/09/2014   TSH 3.310 10/09/2013   TSH 1.12 06/05/2013   FREET4 0.71 03/17/2017   FREET4 1.03 02/02/2017   FREET4 1.43 12/23/2016   FREET4 2.54 (H) 12/01/2016   FREET4 4.98 (H) 10/25/2016   FREET4 2.48 (H) 08/24/2016   FREET4 1.00 10/08/2014   FREET4 0.82 04/09/2014   FREET4 1.02 10/09/2013   FREET4 0.82 06/05/2013    Component     Latest Ref Rng & Units 08/24/2016  TSI     <140 % baseline 258 (H)   Pt denies: - feeling nodules in neck - hoarseness - dysphagia - choking - SOB with lying down  She c/o: - + fatigue - + hair loss - no tremors - + weight loss - no excessive sweating/heat intolerance - no anxiety - no palpitations - no hyperdefecation, no constipation - no hair loss  Pt does not have FH of thyroid ds. No FH of thyroid cancer. No h/o radiation tx to head or neck.  No seaweed or kelp. No recent contrast studies. No herbal supplements. No Biotin  use. No recent steroids use.   ROS: Constitutional: + see HPI Eyes: no blurry vision, no xerophthalmia ENT: no sore throat, + see HPI Cardiovascular: no CP/no SOB/no palpitations/no leg swelling Respiratory: no cough/no SOB/no wheezing Gastrointestinal: no N/no V/no D/no C/no acid reflux Musculoskeletal: no muscle aches/no joint aches Skin: no rashes, + hair loss Neurological: no tremors/no numbness/no tingling/no dizziness  I reviewed pt's medications, allergies, PMH, social hx, family hx, and changes were documented in the history of present illness. Otherwise, unchanged from my initial visit note.  Past Medical History:  Diagnosis Date  . Graves disease   . Thyroid disease    Past Surgical History:  Procedure Laterality Date  . BUNIONECTOMY  1996   bilateral   Social History   Social History  . Marital status: Married    Spouse name: N/A  . Number of children: 3  . 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 Smoking status: Current Some Day Smoker    Types: Cigarettes  . Smokeless tobacco: Never Used     Comment: 1-2 cigarettes once in a while  . Alcohol use Yes  .  Drug use: No  . Sexual activity: Not on file   Other Topics Concern  . Not on file   Social History Narrative   Regular exercise-no   Caffeine Use-yes   No Known Allergies Family History  Problem Relation Age of Onset  . Cancer Mother        Breast Cancer  . Diabetes Mother   . Heart disease Mother   . Hypertension Mother   . Breast cancer Mother   . Cancer Maternal Grandmother        Breast Cancer  . Breast cancer Maternal Grandmother    Current Outpatient Prescriptions  Medication Sig Dispense Refill  . chlorthalidone (HYGROTON) 25 MG tablet   0  . losartan (COZAAR) 50 MG tablet Take 50 mg by mouth daily.    . methimazole (TAPAZOLE) 5 MG tablet Take 10 mg in the am, and 5 mg in the pm 90 tablet 1  . metoprolol succinate  (TOPROL-XL) 100 MG 24 hr tablet   0  . clonazePAM (KLONOPIN) 0.5 MG tablet     . Pseudoeph-Doxylamine-DM-APAP (NYQUIL PO) Take by mouth.    . triamcinolone cream (KENALOG) 0.1 % Apply 1 application topically 3 (three) times daily. As needed for itching (Patient not taking: Reported on 12/23/2016) 45 g 1   No current facility-administered medications for this visit.     PE: BP 102/62 (BP Location: Left Arm, Patient Position: Sitting)   Pulse 69   Ht 5\' 7"  (1.702 m)   Wt 144 lb (65.3 kg)   LMP 03/27/2017   SpO2 98%   BMI 22.55 kg/m  Wt Readings from Last 3 Encounters:  04/21/17 144 lb (65.3 kg)  12/23/16 152 lb (68.9 kg)  08/24/16 160 lb (72.6 kg)   Constitutional: overweight, in NAD Eyes: PERRLA, EOMI, no exophthalmos ENT: moist mucous membranes, + symmetric slight thyromegaly, no cervical lymphadenopathy Cardiovascular: RRR, No MRG Respiratory: CTA B Gastrointestinal: abdomen soft, NT, ND, BS+ Musculoskeletal: no deformities, strength intact in all 4 Skin: moist, warm, no rashes Neurological: + tremor with outstretched hands, DTR normal in all 4   ASSESSMENT: 1. Thyrotoxicosis - likely Graves ds.  PLAN:  1. Patient with a history of thyrotoxicosis, likely Graves ds., with initial thyrotoxic sxs, now resolved - but has hair loss and (intentional) weight loss. We increased MMI to 10 mg in am and 5 mg in pm >> labs improved on this per last check. Will recheck in 2 weeks as she has been off [place] for 4 days 2 weeks ago. - We again discussed about RAI tx, which I suggested >> she refuses this - She is on 50 mg Toprol XL but reports low BP at home >> OK to stop but advised her to discuss with PCP also - RTC in 6 mo, but sooner for labs  Philemon Kingdom, MD PhD [facility]

## 2017-05-03 MED FILL — methIMAzole 5 MG TABS: 5 | 30 days supply | Qty: 90 | Fill #1

## 2017-05-05 ENCOUNTER — Other Ambulatory Visit (INDEPENDENT_AMBULATORY_CARE_PROVIDER_SITE_OTHER): Payer: 59

## 2017-05-05 ENCOUNTER — Encounter: Payer: Self-pay | Admitting: Internal Medicine

## 2017-05-05 DIAGNOSIS — E0500 Thyrotoxicosis with diffuse goiter without thyrotoxic crisis or storm: Secondary | ICD-10-CM

## 2017-05-05 LAB — T4, FREE: Free T4: 0.86 ng/dL (ref 0.60–1.60)

## 2017-05-05 LAB — TSH: TSH: <0.01 u[IU]/mL — ABNORMAL LOW (ref 0.35–4.50)

## 2017-05-05 LAB — T3, FREE: T3, Free: 4.8 pg/mL — ABNORMAL HIGH (ref 2.3–4.2)

## 2017-05-05 MED ORDER — METHIMAZOLE 5 MG PO TABS: ORAL_TABLET | ORAL | 1 refills | Status: DC

## 2017-05-05 NOTE — Progress Notes (Signed)
Dear

## 2017-05-09 ENCOUNTER — Other Ambulatory Visit: Payer: Self-pay | Admitting: Internal Medicine

## 2017-05-15 MED FILL — CHLORTHALIDONE 25 MG TABLET: 25 | 90 days supply | Qty: 45 | Fill #0

## 2017-05-22 ENCOUNTER — Telehealth: Payer: Self-pay

## 2017-05-22 ENCOUNTER — Telehealth: Payer: Self-pay | Admitting: Family Medicine

## 2017-05-22 MED ORDER — METHIMAZOLE 5 MG PO TABS: ORAL_TABLET | ORAL | 1 refills | Status: DC

## 2017-05-22 MED FILL — methIMAzole 10 MG TABS: 10 | 90 days supply | Qty: 180 | Fill #0

## 2017-05-22 NOTE — Telephone Encounter (Signed)
Called patient and advised of new RX, also scheduled patient for lab appointment in July.

## 2017-05-22 NOTE — Telephone Encounter (Signed)
Patient needs script for methimazole (TAPAZOLE) 5 MG tablet to be updated to 4 pills a day in her chart so that she does not run into any issues at [facility].  Right now it's listed at 3 pills per day.  Patient is requesting a call back to discuss.  Thank you,  -LL

## 2017-06-05 ENCOUNTER — Ambulatory Visit (INDEPENDENT_AMBULATORY_CARE_PROVIDER_SITE_OTHER): Payer: 59 | Admitting: Physician Assistant

## 2017-06-05 ENCOUNTER — Encounter: Payer: Self-pay | Admitting: Physician Assistant

## 2017-06-05 VITALS — BP 143/91 | HR 105 | Temp 97.4°F | Resp 18 | Ht 67.0 in | Wt 148.0 lb

## 2017-06-05 DIAGNOSIS — M25532 Pain in left wrist: Secondary | ICD-10-CM

## 2017-06-05 DIAGNOSIS — W5501XA Bitten by cat, initial encounter: Secondary | ICD-10-CM

## 2017-06-05 MED ORDER — AMOXICILLIN-POT CLAVULANATE 875-125 MG PO TABS: 1.0000 | ORAL_TABLET | Freq: Two times a day (BID) | ORAL | 0 refills | Status: AC

## 2017-06-05 MED FILL — AMOX-CLAV 875-125 MG TABLET: 875-125 | 10 days supply | Qty: 20 | Fill #0

## 2017-06-05 NOTE — Patient Instructions (Signed)
     IF you received an x-ray today, you will receive an invoice from [facility]. Please contact [facility] at [phone] with questions or concerns regarding your invoice.   IF you received labwork today, you will receive an invoice from [facility]. Please contact [facility] at [phone] with questions or concerns regarding your invoice.   Our billing staff will not be able to assist you with questions regarding bills from these companies.  You will be contacted with the lab results as soon as they are available. The fastest way to get your results is to activate your My Chart account. Instructions are located on the last page of this paperwork. If you have not heard from us regarding the results in 2 weeks, please contact this office.     

## 2017-06-05 NOTE — Progress Notes (Signed)
   Samantha Peck  MRN: [id number] DOB: [birth year]  PCP: Leonard Downing, MD  Chief Complaint  Patient presents with  . Animal Bite    left wrist x2days ago daughters inside cat     Subjective:  Pt presents to clinic for cat bite 48h ago.  The cat is UTD on vaccines and does not go outside. She had local pain that has gotten worse and the erythema has gotten worse since the bite.  She does not have pain in her hand but when she moves her wrist she does have pain.  Patient is left handed.  TDAP - within 10 years   Review of Systems  Constitutional: Negative for chills and fever.    Patient Active Problem List   Diagnosis Date Noted  . Graves disease 07/13/2012    Current Outpatient Prescriptions on File Prior to Visit  Medication Sig Dispense Refill  . chlorthalidone (HYGROTON) 25 MG tablet   0  . losartan (COZAAR) 50 MG tablet Take 50 mg by mouth daily.    . methimazole (TAPAZOLE) 5 MG tablet Take 10 mg in the am, and 10 mg in the pm 360 tablet 1   No current facility-administered medications on file prior to visit.     No Known Allergies  Pt patients past, family and social history were reviewed and updated.   Objective:  BP (!) 143/91   Pulse (!) 105   Temp 97.4 F (36.3 C) (Oral)   Resp 18   Ht 5\' 7"  (1.702 m)   Wt 148 lb (67.1 kg)   LMP 05/22/2017   SpO2 97%   BMI 23.18 kg/m   Physical Exam  Constitutional: She is oriented to person, place, and time and well-developed, well-nourished, and in no distress.  HENT:  Head: Normocephalic and atraumatic.  Right Ear: Hearing and external ear normal.  Left Ear: Hearing and external ear normal.  Eyes: Conjunctivae are normal.  Neck: Normal range of motion.  Pulmonary/Chest: Effort normal.  Musculoskeletal:       Arms: Neurological: She is alert and oriented to person, place, and time. Gait normal.  Skin: Skin is warm and dry. There is erythema (area marked with skin marked - 2 puncture wound both with  scabs - tender over the wounds with swelling).  Psychiatric: Mood, memory, affect and judgment normal.  Vitals reviewed.   Assessment and Plan :  Cat bite, initial encounter - Plan: amoxicillin-clavulanate (AUGMENTIN) 875-125 MG tablet  Left wrist pain   Decrease movement of wrist.  Monitoring the swelling - recheck in 48h unless much better - finish all abx  Windell Hummingbird PA-C  [facility] 06/05/2017 12:56 PM

## 2017-06-07 ENCOUNTER — Ambulatory Visit: Payer: 59 | Admitting: Physician Assistant

## 2017-06-27 ENCOUNTER — Encounter: Payer: Self-pay | Admitting: Internal Medicine

## 2017-07-06 ENCOUNTER — Other Ambulatory Visit (INDEPENDENT_AMBULATORY_CARE_PROVIDER_SITE_OTHER): Payer: 59

## 2017-07-06 DIAGNOSIS — E0500 Thyrotoxicosis with diffuse goiter without thyrotoxic crisis or storm: Secondary | ICD-10-CM | POA: Diagnosis not present

## 2017-07-06 LAB — TSH: TSH: 10.71 u[IU]/mL — ABNORMAL HIGH (ref 0.35–4.50)

## 2017-07-06 LAB — T3, FREE: T3, Free: 2.9 pg/mL (ref 2.3–4.2)

## 2017-07-06 LAB — T4, FREE: Free T4: 0.26 ng/dL — ABNORMAL LOW (ref 0.60–1.60)

## 2017-07-07 ENCOUNTER — Other Ambulatory Visit: Payer: 59

## 2017-07-07 MED ORDER — METHIMAZOLE 5 MG PO TABS: ORAL_TABLET | ORAL | 1 refills | Status: DC

## 2017-08-04 MED FILL — CHLORTHALIDONE 25 MG TABLET: 25 | 30 days supply | Qty: 15 | Fill #1

## 2017-08-04 MED FILL — LOSARTAN POTASSIUM 50 MG TA: 50 | 90 days supply | Qty: 90 | Fill #0

## 2017-08-17 ENCOUNTER — Other Ambulatory Visit (INDEPENDENT_AMBULATORY_CARE_PROVIDER_SITE_OTHER): Payer: 59

## 2017-08-17 DIAGNOSIS — E0500 Thyrotoxicosis with diffuse goiter without thyrotoxic crisis or storm: Secondary | ICD-10-CM | POA: Diagnosis not present

## 2017-08-17 LAB — TSH: TSH: 2.3 u[IU]/mL (ref 0.35–4.50)

## 2017-08-17 LAB — T3, FREE: T3, Free: 3.9 pg/mL (ref 2.3–4.2)

## 2017-08-17 LAB — T4, FREE: Free T4: 0.54 ng/dL — ABNORMAL LOW (ref 0.60–1.60)

## 2017-09-04 MED FILL — CHLORTHALIDONE 25 MG TABLET: 25 | 90 days supply | Qty: 45 | Fill #0

## 2017-09-06 MED FILL — SF 5000 PLUS CREAM: 1.1 | 30 days supply | Qty: 51 | Fill #1

## 2017-10-09 MED FILL — methIMAzole 10 MG TABS: 10 | 90 days supply | Qty: 180 | Fill #1

## 2017-10-17 DIAGNOSIS — D2262 Melanocytic nevi of left upper limb, including shoulder: Secondary | ICD-10-CM | POA: Diagnosis not present

## 2017-10-17 DIAGNOSIS — Z85828 Personal history of other malignant neoplasm of skin: Secondary | ICD-10-CM | POA: Diagnosis not present

## 2017-10-17 DIAGNOSIS — L720 Epidermal cyst: Secondary | ICD-10-CM | POA: Diagnosis not present

## 2017-10-17 DIAGNOSIS — D225 Melanocytic nevi of trunk: Secondary | ICD-10-CM | POA: Diagnosis not present

## 2017-10-17 DIAGNOSIS — L821 Other seborrheic keratosis: Secondary | ICD-10-CM | POA: Diagnosis not present

## 2017-10-17 DIAGNOSIS — D2261 Melanocytic nevi of right upper limb, including shoulder: Secondary | ICD-10-CM | POA: Diagnosis not present

## 2017-10-17 DIAGNOSIS — D1801 Hemangioma of skin and subcutaneous tissue: Secondary | ICD-10-CM | POA: Diagnosis not present

## 2017-10-17 DIAGNOSIS — D2271 Melanocytic nevi of right lower limb, including hip: Secondary | ICD-10-CM | POA: Diagnosis not present

## 2017-10-17 DIAGNOSIS — L570 Actinic keratosis: Secondary | ICD-10-CM | POA: Diagnosis not present

## 2017-10-23 ENCOUNTER — Encounter: Payer: Self-pay | Admitting: Internal Medicine

## 2017-10-23 ENCOUNTER — Ambulatory Visit: Payer: 59 | Admitting: Internal Medicine

## 2017-10-23 VITALS — BP 118/74 | HR 84 | Temp 98.4°F | Wt 158.2 lb

## 2017-10-23 DIAGNOSIS — E0500 Thyrotoxicosis with diffuse goiter without thyrotoxic crisis or storm: Secondary | ICD-10-CM | POA: Diagnosis not present

## 2017-10-23 DIAGNOSIS — R635 Abnormal weight gain: Secondary | ICD-10-CM

## 2017-10-23 LAB — T4, FREE: Free T4: 0.86 ng/dL (ref 0.60–1.60)

## 2017-10-23 LAB — TSH: TSH: 0.25 u[IU]/mL — ABNORMAL LOW (ref 0.35–4.50)

## 2017-10-23 LAB — T3, FREE: T3, Free: 4.7 pg/mL — ABNORMAL HIGH (ref 2.3–4.2)

## 2017-10-23 NOTE — Progress Notes (Signed)
Patient ID: Samantha Peck, female   DOB: 04-18-1966, 51 y.o.   MRN: [id number]    HPI  Samantha Peck is a 51 y.o.-year-old female, returning for follow-up for thyrotoxicosis, likely secondary to Graves' disease. She prev. saw Dr. Loanne Drilling, last visit 10/2014. Last visit with me 6 months ago.  Reviewed and addended history: She was dx with Graves ds. In 2010 (lost 15 lbs then)  >> started MMI >> but not compliant 2/2 lack of insurance >>  off the med completely since 11/2015. She started to have insurance again last year >> we restarted MMI.  We changed to methimazole 10 mg in am and 5 mg in pm in 01/2017 >> TFTs improved afterwards.  After last visit, we were able to decrease the methimazole dose to 5 mg twice a day and TFTs normalized.  I reviewed pt's thyroid tests: Lab Results  Component Value Date   TSH 2.30 08/17/2017   TSH 10.71 (H) 07/06/2017   TSH <0.01 (L) 05/05/2017   TSH <0.01 Repeated and verified X2. (L) 03/17/2017   TSH <0.01 (L) 02/02/2017   TSH 0.07 (L) 12/23/2016   TSH 0.01 (L) 12/01/2016   TSH 0.63 10/25/2016   TSH 0.38 08/24/2016   TSH 1.65 10/08/2014   FREET4 0.54 (L) 08/17/2017   FREET4 0.26 (L) 07/06/2017   FREET4 0.86 05/05/2017   FREET4 0.71 03/17/2017   FREET4 1.03 02/02/2017   FREET4 1.43 12/23/2016   FREET4 2.54 (H) 12/01/2016   FREET4 4.98 (H) 10/25/2016   FREET4 2.48 (H) 08/24/2016   FREET4 1.00 10/08/2014    Lab Results  Component Value Date   TSI 258 (H) 08/24/2016   Pt denies: - feeling nodules in neck - hoarseness - dysphagia - choking - SOB with lying down  Pt does not have FH of thyroid ds. No FH of thyroid cancer. No h/o radiation tx to head or neck.  No seaweed or kelp. No recent contrast studies. No herbal supplements. No Biotin use. No recent steroids use.   ROS: Constitutional: + weight gain/no weight loss, no fatigue, no subjective hyperthermia, no subjective hypothermia Eyes: no blurry vision, no xerophthalmia ENT: no  sore throat, + see HPI Cardiovascular: no CP/no SOB/no palpitations/no leg swelling Respiratory: no cough/no SOB/no wheezing Gastrointestinal: no N/no V/no D/no C/no acid reflux Musculoskeletal: no muscle aches/no joint aches Skin: no rashes, no hair loss Neurological: no tremors/no numbness/no tingling/no dizziness  I reviewed pt's medications, allergies, PMH, social hx, family hx, and changes were documented in the history of present illness. Otherwise, unchanged from my initial visit note.   Past Medical History:  Diagnosis Date  . Cancer ([place])    skin  . Graves disease   . Hypertension   . Thyroid disease    Past Surgical History:  Procedure Laterality Date  . BUNIONECTOMY  1996   bilateral   Social History   Socioeconomic History  . Marital status: Married    Spouse name: Not on file  . Number of children: 3  . Years of education: 50  . Highest education level: Not on file  Social Needs  . Financial resource strain: Not on file  . Food insecurity - worry: Not on file  . Food insecurity - inability: Not on file  . Transportation needs - medical: Not on file  . Transportation needs - non-medical: Not on file  Occupational History  . Occupation: Buyer, retail: [employer]  Tobacco Use  . Smoking status: Former Smoker  Types: Cigarettes  . Smokeless tobacco: Never Used  . Tobacco comment: 1-2 cigarettes once in a while  Substance and Sexual Activity  . Alcohol use: Yes  . Drug use: No  . Sexual activity: Not on file  Other Topics Concern  . Not on file  Social History Narrative   Regular exercise-no   Caffeine Use-yes   No Known Allergies Family History  Problem Relation Age of Onset  . Cancer Mother        Breast Cancer  . Diabetes Mother   . Heart disease Mother   . Hypertension Mother   . Breast cancer Mother   . Cancer Maternal Grandmother        Breast Cancer  . Breast cancer Maternal Grandmother    Current Outpatient Medications   Medication Sig Dispense Refill  . chlorthalidone (HYGROTON) 25 MG tablet   0  . losartan (COZAAR) 50 MG tablet Take 50 mg by mouth daily.    . methimazole (TAPAZOLE) 5 MG tablet Take 5 mg in the am, and 5 mg in the pm 180 tablet 1   No current facility-administered medications for this visit.     PE: BP 118/74 (BP Location: Left Arm, Patient Position: Sitting, Cuff Size: Normal)   Pulse 84   Temp 98.4 F (36.9 C) (Oral)   Wt 158 lb 4 oz (71.8 kg)   SpO2 96%   BMI 24.79 kg/m  Wt Readings from Last 3 Encounters:  10/23/17 158 lb 4 oz (71.8 kg)  06/05/17 148 lb (67.1 kg)  04/21/17 144 lb (65.3 kg)   Constitutional: overweight, in NAD Eyes: PERRLA, EOMI, no exophthalmos ENT: moist mucous membranes, + symmetric slight thyromegaly, no cervical lymphadenopathy Cardiovascular: RRR, No MRG Respiratory: CTA B Gastrointestinal: abdomen soft, NT, ND, BS+ Musculoskeletal: no deformities, strength intact in all 4 Skin: moist, warm, no rashes Neurological: + mild tremor with outstretched hands (L>R), DTR normal in all 4  ASSESSMENT: 1. Thyrotoxicosis - likely Graves ds.  2. Weight gain  PLAN:  1. Patient with a history of thyrotoxicosis, likely Graves' disease, with initial thyrotoxic symptoms, now resolved.  At last visit, he continued to have hair loss and (intentional) weight loss. She was on methimazole 10 mg in a.m. and 5 mg in p.m. but we were able to decrease the doses to 5 mg twice a day in 06/2017.  Subsequent TFTs obtained 08/2017 were normal.  At last visit, we also stopped Toprol-XL as she reported low blood pressure readings at home. - At this visit, she is asymptomatic (except for weight gain - 10 lbs) and continues on the 5 mg twice a day of methimazole.  She has no side effects from this (again explained what these can be). She has not missed doses. - We again discussed about RAI treatment but we decided together to continue with methimazole for now. - We will check TFTs  today and change the methimazole dose accordingly.  We will also repeat her TSIs today to check the activity of her Graves ds. - RTC in 6 months, but most likely sooner for labs  2. Weight gain - she gained 10 lbs since last visit - at last visit, discussed about the benefits of a plant based diet >> she started this and felt better, but fell off the wagon. She restarted it, but includes animal products also. - discussed that the weight gain is expected during resolution of Graves ds, but I also suggested a weight loss program and reviewed this  along with pt >> see Pt instructions.  - time spent with the patient: 25 min, of which >50% was spent in obtaining information about her symptoms, reviewing her previous labs, evaluations, and treatments, counseling her about her conditions (please see the discussed topics above), and developing a plan to further investigate and tx them; she had a number of questions which I addressed.   Component     Latest Ref Rng & Units 10/23/2017  TSH     0.35 - 4.50 uIU/mL 0.25 (L)  T4,Free(Direct)     0.60 - 1.60 ng/dL 0.86  Triiodothyronine,Free,Serum     2.3 - 4.2 pg/mL 4.7 (H)  TSI     <140 % baseline  424 (H)   TSIs higher. TSH slightly low and fT3 higher. Will increase MMI to 7.5 mg in am and 5 mg in pm. Repeat labs in 2 months.   Philemon Kingdom, MD PhD [facility]

## 2017-10-23 NOTE — Patient Instructions (Addendum)
Please look up "The Engine 2 diet" by Christa See. Also, the 7 day rescue diet.  Please continue Methimazole 5 mg 2x a day.  Please stop at the lab.  Please return in 6 months.

## 2017-10-27 LAB — THYROID STIMULATING IMMUNOGLOBULIN: TSI: 424 % baseline — ABNORMAL HIGH (ref <140)

## 2017-10-27 MED ORDER — METHIMAZOLE 5 MG PO TABS: ORAL_TABLET | ORAL | 1 refills | Status: DC

## 2017-11-09 ENCOUNTER — Telehealth: Payer: Self-pay | Admitting: Internal Medicine

## 2017-11-09 MED ORDER — METHIMAZOLE 5 MG PO TABS: ORAL_TABLET | ORAL | 1 refills | Status: DC

## 2017-11-09 MED FILL — methIMAzole 5 MG TABS: 5 | 72 days supply | Qty: 180 | Fill #0

## 2017-11-09 NOTE — Telephone Encounter (Signed)
Pt states she is to take 5 mg in the morning and 7 mg at night. Pt states she has 10 mg at home and needs 5 mg called in so she can take correct dosage per instructions. Pt uses [facility].

## 2017-11-09 NOTE — Telephone Encounter (Signed)
Sent medication

## 2017-12-13 MED FILL — CHLORTHALIDONE 25 MG TABS: 25 | 30 days supply | Qty: 15 | Fill #1

## 2017-12-22 ENCOUNTER — Other Ambulatory Visit (INDEPENDENT_AMBULATORY_CARE_PROVIDER_SITE_OTHER): Payer: 59

## 2017-12-22 ENCOUNTER — Other Ambulatory Visit: Payer: Self-pay | Admitting: Internal Medicine

## 2017-12-22 DIAGNOSIS — E0500 Thyrotoxicosis with diffuse goiter without thyrotoxic crisis or storm: Secondary | ICD-10-CM

## 2017-12-22 LAB — T3, FREE: T3, Free: 3.3 pg/mL (ref 2.3–4.2)

## 2017-12-22 LAB — TSH: TSH: 10.38 u[IU]/mL — ABNORMAL HIGH (ref 0.35–4.50)

## 2017-12-22 LAB — T4, FREE: Free T4: 0.48 ng/dL — ABNORMAL LOW (ref 0.60–1.60)

## 2017-12-22 MED ORDER — METHIMAZOLE 5 MG PO TABS: ORAL_TABLET | ORAL | 2 refills | Status: DC

## 2018-01-01 ENCOUNTER — Other Ambulatory Visit: Payer: Self-pay | Admitting: Family Medicine

## 2018-01-01 DIAGNOSIS — Z139 Encounter for screening, unspecified: Secondary | ICD-10-CM

## 2018-01-18 MED FILL — LOSARTAN POTASSIUM 50 MG TA: 50 | 90 days supply | Qty: 90 | Fill #0

## 2018-01-18 MED FILL — CHLORTHALIDONE 25 MG TAB: 25 | 90 days supply | Qty: 45 | Fill #0

## 2018-02-02 ENCOUNTER — Other Ambulatory Visit: Payer: Self-pay | Admitting: *Deleted

## 2018-02-02 MED ORDER — METHIMAZOLE 5 MG PO TABS: ORAL_TABLET | ORAL | 1 refills | Status: DC

## 2018-02-06 MED FILL — methIMAzole 5 MG TABS: 5 | 72 days supply | Qty: 180 | Fill #1

## 2018-02-08 ENCOUNTER — Ambulatory Visit
Admission: RE | Admit: 2018-02-08 | Discharge: 2018-02-08 | Disposition: A | Discharge status: Home / Self Care | Payer: 59 | Source: Ambulatory Visit | Attending: Family Medicine | Admitting: Family Medicine

## 2018-02-08 DIAGNOSIS — Z139 Encounter for screening, unspecified: Secondary | ICD-10-CM

## 2018-02-08 DIAGNOSIS — Z1231 Encounter for screening mammogram for malignant neoplasm of breast: Secondary | ICD-10-CM | POA: Diagnosis not present

## 2018-02-19 ENCOUNTER — Other Ambulatory Visit: Payer: Self-pay | Admitting: Internal Medicine

## 2018-02-19 ENCOUNTER — Other Ambulatory Visit (INDEPENDENT_AMBULATORY_CARE_PROVIDER_SITE_OTHER): Payer: 59

## 2018-02-19 ENCOUNTER — Other Ambulatory Visit: Payer: 59

## 2018-02-19 DIAGNOSIS — E0500 Thyrotoxicosis with diffuse goiter without thyrotoxic crisis or storm: Secondary | ICD-10-CM | POA: Diagnosis not present

## 2018-02-19 LAB — T3, FREE: T3, Free: 3.4 pg/mL (ref 2.3–4.2)

## 2018-02-19 LAB — TSH: TSH: 6.69 u[IU]/mL — ABNORMAL HIGH (ref 0.35–4.50)

## 2018-02-19 LAB — T4, FREE: Free T4: 0.57 ng/dL — ABNORMAL LOW (ref 0.60–1.60)

## 2018-02-19 MED ORDER — METHIMAZOLE 5 MG PO TABS: ORAL_TABLET | ORAL | 1 refills | Status: DC

## 2018-03-08 MED FILL — PREVIDENT 5000 BOOSTER PLUS: 1.1 | 90 days supply | Qty: 100 | Fill #0

## 2018-03-28 DIAGNOSIS — Z0000 Encounter for general adult medical examination without abnormal findings: Secondary | ICD-10-CM | POA: Diagnosis not present

## 2018-03-28 DIAGNOSIS — Z1211 Encounter for screening for malignant neoplasm of colon: Secondary | ICD-10-CM | POA: Diagnosis not present

## 2018-04-19 DIAGNOSIS — R195 Other fecal abnormalities: Secondary | ICD-10-CM | POA: Diagnosis not present

## 2018-04-19 MED FILL — CHLORTHALIDONE 25 MG TAB: 25 | 90 days supply | Qty: 45 | Fill #0

## 2018-04-19 MED FILL — PEG-3350 SOLUTION: 420 | 1 days supply | Qty: 4000 | Fill #0

## 2018-04-19 MED FILL — LOSARTAN POTASSIUM 50 MG TA: 50 | 90 days supply | Qty: 90 | Fill #0

## 2018-04-23 ENCOUNTER — Ambulatory Visit: Payer: 59 | Admitting: Internal Medicine

## 2018-04-23 ENCOUNTER — Encounter: Payer: Self-pay | Admitting: Internal Medicine

## 2018-04-23 VITALS — BP 124/70 | HR 78 | Ht 67.0 in | Wt 167.0 lb

## 2018-04-23 DIAGNOSIS — E0500 Thyrotoxicosis with diffuse goiter without thyrotoxic crisis or storm: Secondary | ICD-10-CM

## 2018-04-23 DIAGNOSIS — R635 Abnormal weight gain: Secondary | ICD-10-CM

## 2018-04-23 LAB — T4, FREE: Free T4: 0.96 ng/dL (ref 0.60–1.60)

## 2018-04-23 LAB — T3, FREE: T3, Free: 4 pg/mL (ref 2.3–4.2)

## 2018-04-23 LAB — TSH: TSH: 0.1 u[IU]/mL — ABNORMAL LOW (ref 0.35–4.50)

## 2018-04-23 NOTE — Progress Notes (Signed)
Patient ID: Samantha Peck, female   DOB: 13-Oct-1966, 52 y.o.   MRN: [id number]    HPI  Samantha Peck is a 52 y.o.-year-old female, returning for follow-up for thyrotoxicosis, likely secondary to Graves' disease. She prev. saw Dr. Loanne Drilling, last visit 10/2014. Last visit with me 6 mo ago.  Reviewed and addended history: She was dx with Graves ds. In 2010 (lost 15 lbs then)  >> started MMI >> but not compliant 2/2 lack of insurance >>  off the med completely since 11/2015. She started to have insurance again last year >> we restarted MMI.  We changed to methimazole 10 mg in am and 5 mg in pm in 01/2017 >> TFTs improved afterwards.  In 10/2017, we increase the dose of methimazole to 7.5 mg in a.m. and 5 mg in p.m.  However, next TSH was elevated, so we decreased the dose to 5 mg twice a day in 12/2017, and then 5 mg once a day and 02/2018.    I reviewed pt's thyroid tests >> TSH elevated: Lab Results  Component Value Date   TSH 6.69 (H) 02/19/2018   TSH 10.38 (H) 12/22/2017   TSH 0.25 (L) 10/23/2017   TSH 2.30 08/17/2017   TSH 10.71 (H) 07/06/2017   TSH <0.01 (L) 05/05/2017   TSH <0.01 Repeated and verified X2. (L) 03/17/2017   TSH <0.01 (L) 02/02/2017   TSH 0.07 (L) 12/23/2016   TSH 0.01 (L) 12/01/2016   FREET4 0.57 (L) 02/19/2018   FREET4 0.48 (L) 12/22/2017   FREET4 0.86 10/23/2017   FREET4 0.54 (L) 08/17/2017   FREET4 0.26 (L) 07/06/2017   FREET4 0.86 05/05/2017   FREET4 0.71 03/17/2017   FREET4 1.03 02/02/2017   FREET4 1.43 12/23/2016   FREET4 2.54 (H) 12/01/2016    TSI increased at last check: Lab Results  Component Value Date   TSI 424 (H) 10/23/2017   TSI 258 (H) 08/24/2016   Pt denies: - feeling nodules in neck - hoarseness - dysphagia - choking - SOB with lying down  Pt does not have FH of thyroid ds. No FH of thyroid cancer. No h/o radiation tx to head or neck.  No seaweed or kelp. No recent contrast studies. No herbal supplements. No Biotin use. No recent  steroids use.   ROS: Constitutional: + weight gain/no weight loss, no fatigue, no subjective hyperthermia, no subjective hypothermia, + nocturia Eyes: no blurry vision, no xerophthalmia ENT: + sore throat (allergies), + see HPI Cardiovascular: no CP/no SOB/no palpitations/+ leg swelling Respiratory: no cough/no SOB/no wheezing Gastrointestinal: no N/no V/no D/+ C/no acid reflux Musculoskeletal: no muscle aches/no joint aches Skin: no rashes, + hair loss Neurological: no tremors/no numbness/no tingling/no dizziness  I reviewed pt's medications, allergies, PMH, social hx, family hx, and changes were documented in the history of present illness. Otherwise, unchanged from my initial visit note.   Past Medical History:  Diagnosis Date  . Cancer ([place])    skin  . Graves disease   . Hypertension   . Thyroid disease    Past Surgical History:  Procedure Laterality Date  . BREAST BIOPSY Right 2009   benign  . BUNIONECTOMY  1996   bilateral   Social History   Socioeconomic History  . Marital status: Married    Spouse name: Not on file  . Number of children: 3  . Years of education: 88  . Highest education level: Not on file  Occupational History  . Occupation: Buyer, retail:  [employer]  . Financial resource strain: Not on file  . Food insecurity:    Worry: Not on file    Inability: Not on file  . Transportation needs:    Medical: Not on file    Non-medical: Not on file  Tobacco Use  . Smoking status: Current Some Day Smoker    Types: Cigarettes  . Smokeless tobacco: Never Used  . Tobacco comment: 1-2 cigarettes once in a while  Substance and Sexual Activity  . Alcohol use: Yes  . Drug use: No  . Sexual activity: Not on file  Lifestyle  . Physical activity:    Days per week: Not on file    Minutes per session: Not on file  . Stress: Not on file  Relationships  . Social connections:    Talks on phone: Not on file    Gets together: Not on file     Attends religious service: Not on file    Active member of club or organization: Not on file    Attends meetings of clubs or organizations: Not on file    Relationship status: Not on file  . Intimate partner violence:    Fear of current or ex partner: Not on file    Emotionally abused: Not on file    Physically abused: Not on file    Forced sexual activity: Not on file  Other Topics Concern  . Not on file  Social History Narrative   Regular exercise-no   Caffeine Use-yes   No Known Allergies Family History  Problem Relation Age of Onset  . Cancer Mother        Breast Cancer  . Diabetes Mother   . Heart disease Mother   . Hypertension Mother   . Breast cancer Mother   . Cancer Maternal Grandmother        Breast Cancer  . Breast cancer Maternal Grandmother    Current Outpatient Medications  Medication Sig Dispense Refill  . BABY ASPIRIN PO Take daily by mouth.    . chlorthalidone (HYGROTON) 25 MG tablet   0  . losartan (COZAAR) 50 MG tablet Take 50 mg by mouth daily.    . methimazole (TAPAZOLE) 5 MG tablet Take 5 mg in the am 90 tablet 1   No current facility-administered medications for this visit.    PE: BP 124/70   Pulse 78   Ht 5\' 7"  (1.702 m)   Wt 167 lb (75.8 kg)   SpO2 98%   BMI 26.16 kg/m  Wt Readings from Last 3 Encounters:  04/23/18 167 lb (75.8 kg)  10/23/17 158 lb 4 oz (71.8 kg)  06/05/17 148 lb (67.1 kg)   Constitutional: overweight, in NAD Eyes: PERRLA, EOMI, no exophthalmos ENT: moist mucous membranes, + symmetric slight thyromegaly, no cervical lymphadenopathy Cardiovascular: RRR, No MRG Respiratory: CTA B Gastrointestinal: abdomen soft, NT, ND, BS+ Musculoskeletal: no deformities, strength intact in all 4 Skin: moist, warm, no rashes Neurological: + tremor with outstretched hands, DTR normal in all 4  ASSESSMENT: 1.  Graves' disease  2. Weight gain  PLAN:  1. Patient with history of thyrotoxicosis, likely Graves' disease, with initial  thyrotoxic symptoms, now resolved.  We started methimazole initially at 10 mg in a.m. and 5 mg in p.m. and then we decreased the dose, however, TSH was slightly suppressed at last visit and we had to increase the dose again.  Subsequent TSH levels were high so we decreased the methimazole dose, with the latest  dose change in 02/2018, to 5 mg daily.   - She is due for another TFT check, which we will perform today.  We will then change the methimazole dose accordingly - She continues to stay asymptomatic except for weight gain.   - No side effects from methimazole.  - We again discussed about RAI treatment, but she is responding well to methimazole, so we decided to continue with this for now-  - RTC in 6 months, but most likely sooner for labs  2. Weight gain - she gained another net 9 pounds since last visit - At last visit it, discussed about the benefits of a plant based diet >> she started this and felt better, but fell off the wagon.  Since last visit, she started to reduce the portions and lost approximately 4 pounds recently.  Discussed about including greens with all meals to keep her fall. - Discussed that weight gain is expected during resolution of Graves', but I would like her not to be in the hypothyroid range while on methimazole so we have to be careful with adjusting the doses.  Component     Latest Ref Rng & Units 04/23/2018  TSH     0.35 - 4.50 uIU/mL 0.10 (L)  T4,Free(Direct)     0.60 - 1.60 ng/dL 0.96  Triiodothyronine,Free,Serum     2.3 - 4.2 pg/mL 4.0   TSH slightly suppressed >> increase MMI dose to 7.5 mg daily and RTC for labs in 1.5 mo.  Philemon Kingdom, MD PhD [facility] Endocrinology

## 2018-04-23 NOTE — Patient Instructions (Addendum)
Please continue Methimazole 5 mg 1x a day.  Please stop at the lab.  Please return in 6 months.

## 2018-04-24 MED ORDER — METHIMAZOLE 5 MG PO TABS: ORAL_TABLET | ORAL | 1 refills | Status: DC

## 2018-05-14 DIAGNOSIS — K5730 Diverticulosis of large intestine without perforation or abscess without bleeding: Secondary | ICD-10-CM | POA: Diagnosis not present

## 2018-05-14 DIAGNOSIS — R195 Other fecal abnormalities: Secondary | ICD-10-CM | POA: Diagnosis not present

## 2018-05-14 DIAGNOSIS — D126 Benign neoplasm of colon, unspecified: Secondary | ICD-10-CM | POA: Diagnosis not present

## 2018-05-14 LAB — HM COLONOSCOPY

## 2018-06-06 ENCOUNTER — Other Ambulatory Visit (INDEPENDENT_AMBULATORY_CARE_PROVIDER_SITE_OTHER): Payer: 59

## 2018-06-06 DIAGNOSIS — E0500 Thyrotoxicosis with diffuse goiter without thyrotoxic crisis or storm: Secondary | ICD-10-CM | POA: Diagnosis not present

## 2018-06-06 LAB — T3, FREE: T3, Free: 4.6 pg/mL — ABNORMAL HIGH (ref 2.3–4.2)

## 2018-06-06 LAB — T4, FREE: Free T4: 1.17 ng/dL (ref 0.60–1.60)

## 2018-06-06 LAB — TSH: TSH: 0.29 u[IU]/mL — ABNORMAL LOW (ref 0.35–4.50)

## 2018-06-08 ENCOUNTER — Other Ambulatory Visit: Payer: Self-pay | Admitting: Internal Medicine

## 2018-06-08 DIAGNOSIS — E0500 Thyrotoxicosis with diffuse goiter without thyrotoxic crisis or storm: Secondary | ICD-10-CM

## 2018-07-03 ENCOUNTER — Telehealth: Payer: Self-pay | Admitting: Emergency Medicine

## 2018-07-03 MED ORDER — METHIMAZOLE 5 MG PO TABS: ORAL_TABLET | ORAL | 1 refills | Status: DC

## 2018-07-03 MED FILL — methIMAzole 5 MG TABS: 5 | 60 days supply | Qty: 90 | Fill #0

## 2018-07-03 NOTE — Telephone Encounter (Signed)
Pt called and wants to know if she can get a refill on her methimazole (TAPAZOLE) 5 MG tablet. Pharmacy is [facility]. Thanks.

## 2018-07-03 NOTE — Telephone Encounter (Signed)
Sent!

## 2018-07-19 MED FILL — LOSARTAN POTASSIUM 50 MG TA: 50 | 90 days supply | Qty: 90 | Fill #0

## 2018-08-06 ENCOUNTER — Other Ambulatory Visit: Payer: 59

## 2018-08-06 ENCOUNTER — Other Ambulatory Visit (INDEPENDENT_AMBULATORY_CARE_PROVIDER_SITE_OTHER): Payer: 59

## 2018-08-06 DIAGNOSIS — E0500 Thyrotoxicosis with diffuse goiter without thyrotoxic crisis or storm: Secondary | ICD-10-CM

## 2018-08-06 LAB — T4, FREE: Free T4: 0.79 ng/dL (ref 0.60–1.60)

## 2018-08-06 LAB — T3, FREE: T3, Free: 4.9 pg/mL — ABNORMAL HIGH (ref 2.3–4.2)

## 2018-08-06 LAB — TSH: TSH: 0.85 u[IU]/mL (ref 0.35–4.50)

## 2018-08-27 MED FILL — CHLORTHALIDONE 25 MG TAB: 25 | 90 days supply | Qty: 45 | Fill #0

## 2018-08-27 MED FILL — methIMAzole 5 MG TABS: 5 | 60 days supply | Qty: 90 | Fill #1

## 2018-10-12 MED FILL — LOSARTAN POTASSIUM 50 MG TA: 50 | 90 days supply | Qty: 90 | Fill #1

## 2018-10-25 ENCOUNTER — Ambulatory Visit: Payer: 59 | Admitting: Internal Medicine

## 2018-10-25 VITALS — BP 120/78 | HR 87 | Ht 67.0 in | Wt 170.0 lb

## 2018-10-25 DIAGNOSIS — R635 Abnormal weight gain: Secondary | ICD-10-CM | POA: Diagnosis not present

## 2018-10-25 DIAGNOSIS — L659 Nonscarring hair loss, unspecified: Secondary | ICD-10-CM | POA: Diagnosis not present

## 2018-10-25 DIAGNOSIS — E0500 Thyrotoxicosis with diffuse goiter without thyrotoxic crisis or storm: Secondary | ICD-10-CM

## 2018-10-25 LAB — TSH: TSH: 0.15 u[IU]/mL — ABNORMAL LOW (ref 0.35–4.50)

## 2018-10-25 LAB — T3, FREE: T3, Free: 4.6 pg/mL — ABNORMAL HIGH (ref 2.3–4.2)

## 2018-10-25 LAB — T4, FREE: Free T4: 0.99 ng/dL (ref 0.60–1.60)

## 2018-10-25 NOTE — Progress Notes (Signed)
Patient ID: Samantha Peck, female   DOB: [birth year], 52 y.o.   MRN: [id number]    HPI  Samantha Peck is a 52 y.o.-year-old female, returning for follow-up for thyrotoxicosis, likely secondary to Graves' disease.  Last visit 6 months ago.  Reviewed and addended history: She was dx with Graves ds. In 2010 (lost 15 lbs then)  >> started MMI >> but not compliant 2/2 lack of insurance >>  off the med completely since 11/2015. She started to have insurance again last year >> we restarted MMI.  We changed to methimazole 10 mg in am and 5 mg in pm in 01/2017 >> TFTs improved afterwards.  In 10/2017, we increase the dose of methimazole to 7.5 mg in a.m. and 5 mg in p.m.  However, next TSH was elevated, so we decreased the dose to 5 mg twice a day in 12/2017, and then 5 mg once a day and 02/2018.    In 04/2018, we increased her methimazole dose to 7.5 mg daily.  TFTs normalized on this dose.  Reviewed patient's TFTs-last TSH was normal: Lab Results  Component Value Date   TSH 0.85 08/06/2018   TSH 0.29 (L) 06/06/2018   TSH 0.10 (L) 04/23/2018   TSH 6.69 (H) 02/19/2018   TSH 10.38 (H) 12/22/2017   TSH 0.25 (L) 10/23/2017   TSH 2.30 08/17/2017   TSH 10.71 (H) 07/06/2017   TSH <0.01 (L) 05/05/2017   TSH <0.01 Repeated and verified X2. (L) 03/17/2017   FREET4 0.79 08/06/2018   FREET4 1.17 06/06/2018   FREET4 0.96 04/23/2018   FREET4 0.57 (L) 02/19/2018   FREET4 0.48 (L) 12/22/2017   FREET4 0.86 10/23/2017   FREET4 0.54 (L) 08/17/2017   FREET4 0.26 (L) 07/06/2017   FREET4 0.86 05/05/2017   FREET4 0.71 03/17/2017    TSI increased at last check: Lab Results  Component Value Date   TSI 424 (H) 10/23/2017   TSI 258 (H) 08/24/2016   Pt denies: - feeling nodules in neck - hoarseness - dysphagia - choking - SOB with lying down  Pt does not have FH of thyroid ds. No FH of thyroid cancer. No h/o radiation tx to head or neck.  No herbal supplements. No Biotin use. No recent steroids use.    She has long standing double vision - intermittently, when she is tired. Dr. Keane Police.   She started a supplement containing selenium, magnesium, calcium 6 mo ago - ? Doses.  She did not take this in last 2 weeks.  ROS: Constitutional: + Weight gain/no weight loss, no fatigue, no subjective hyperthermia, no subjective hypothermia Eyes: no blurry vision, no xerophthalmia ENT: no sore throat, + see HPI Cardiovascular: no CP/no SOB/no palpitations/no leg swelling Respiratory: no cough/no SOB/no wheezing Gastrointestinal: no N/no V/no D/no C/no acid reflux Musculoskeletal: no muscle aches/no joint aches Skin: no rashes, + hair loss Neurological: no tremors/no numbness/no tingling/no dizziness  I reviewed pt's medications, allergies, PMH, social hx, family hx, and changes were documented in the history of present illness. Otherwise, unchanged from my initial visit note.  Past Medical History:  Diagnosis Date  . Cancer ([place])    skin  . Graves disease   . Hypertension   . Thyroid disease    Past Surgical History:  Procedure Laterality Date  . BREAST BIOPSY Right 2009   benign  . BUNIONECTOMY  1996   bilateral   Social History   Socioeconomic History  . Marital status: Married    Spouse  name: Not on file  . Number of children: 3  . Years of education: 56  . Highest education level: Not on file  Occupational History  . Occupation: Buyer, retail: [employer]  . Financial resource strain: Not on file  . Food insecurity:    Worry: Not on file    Inability: Not on file  . Transportation needs:    Medical: Not on file    Non-medical: Not on file  Tobacco Use  . Smoking status: Current Some Day Smoker    Types: Cigarettes  . Smokeless tobacco: Never Used  . Tobacco comment: 1-2 cigarettes once in a while  Substance and Sexual Activity  . Alcohol use: Yes  . Drug use: No  . Sexual activity: Not on file  Lifestyle  . Physical activity:    Days per  week: Not on file    Minutes per session: Not on file  . Stress: Not on file  Relationships  . Social connections:    Talks on phone: Not on file    Gets together: Not on file    Attends religious service: Not on file    Active member of club or organization: Not on file    Attends meetings of clubs or organizations: Not on file    Relationship status: Not on file  . Intimate partner violence:    Fear of current or ex partner: Not on file    Emotionally abused: Not on file    Physically abused: Not on file    Forced sexual activity: Not on file  Other Topics Concern  . Not on file  Social History Narrative   Regular exercise-no   Caffeine Use-yes   No Known Allergies Family History  Problem Relation Age of Onset  . Cancer Mother        Breast Cancer  . Diabetes Mother   . Heart disease Mother   . Hypertension Mother   . Breast cancer Mother   . Cancer Maternal Grandmother        Breast Cancer  . Breast cancer Maternal Grandmother    Current Outpatient Medications  Medication Sig Dispense Refill  . BABY ASPIRIN PO Take daily by mouth.    . chlorthalidone (HYGROTON) 25 MG tablet   0  . losartan (COZAAR) 50 MG tablet Take 50 mg by mouth daily.    . methimazole (TAPAZOLE) 5 MG tablet Take 7.5 mg in the am 90 tablet 1   No current facility-administered medications for this visit.    PE: BP 120/78   Pulse 87   Ht 5\' 7"  (1.702 m) Comment: measured  Wt 170 lb (77.1 kg)   SpO2 98%   BMI 26.63 kg/m  Wt Readings from Last 3 Encounters:  10/25/18 170 lb (77.1 kg)  04/23/18 167 lb (75.8 kg)  10/23/17 158 lb 4 oz (71.8 kg)   Constitutional: overweight, in NAD Eyes: PERRLA, EOMI, no exophthalmos ENT: moist mucous membranes, + symmetric slight thyromegaly, no cervical lymphadenopathy Cardiovascular: RRR, No MRG Respiratory: CTA B Gastrointestinal: abdomen soft, NT, ND, BS+ Musculoskeletal: no deformities, strength intact in all 4 Skin: moist, warm, no  rashes Neurological: no tremor with outstretched hands, DTR normal in all 4  ASSESSMENT: 1.  Graves' disease  2. Weight gain  3. Hair loss  PLAN:  1. Patient with history of Graves' disease, with initial thyrotoxic symptoms, now resolved.  We initially started methimazole at 10 mg in the morning and 5 mg  in p.m. and we were then able to decrease the dose.  However, her TSH was slightly suppressed at the beginning of the year so we ended up increasing the methimazole at last visit to 7.5 mg daily.  She now continues on this dose.  Latest TSH was reviewed with the patient this was normal in 07/2018. -She continues to stay asymptomatic except for weight gain, however, since this visit, this is minimal, 3 pounds -No side effects from methimazole -We again discussed about the definitive treatment for Graves' disease including RAI treatment and surgery, but she is responding well to methimazole so we will continue this for now. -We will check her TFTs today and change the methimazole dose accordingly - RTC in 6 months, but likely sooner for labs  2. Weight gain - she gained 9 pounds before last visit, but only 3 pounds since last visit -We discussed in the past about the benefits of a plant-based diet.  She started this and felt better but she then fell off the wagon.  At last visit she was reducing portions and we discussed about including greens with all meals to keep her full.  At this visit, she tells me that she is not following a particular diet.  3. Hair loss -Ongoing -She is taking multivitamins but stopped them 1 week before this visit.  I advised her to make sure they contain B vitamins and biotin and restart them.  She is aware that she needs to stop biotin at least 1 week before we check thyroid labs.  Needs refills.  Component     Latest Ref Rng & Units 10/25/2018  T4,Free(Direct)     0.60 - 1.60 ng/dL 0.99  TSH     0.35 - 4.50 uIU/mL 0.15 (L)  Triiodothyronine,Free,Serum      2.3 - 4.2 pg/mL 4.6 (H)  TSH suppressed, free T3 slightly high, therefore, will need to increase the dose of her methimazole to 5 mg twice a day.  We will need to repeat her labs in 1.5 months. TSI antibodies pending.  Philemon Kingdom, MD PhD [facility] Endocrinology

## 2018-10-25 NOTE — Patient Instructions (Signed)
Please continue methimazole 7.5 mg daily for now.  Please stop at the lab.  Please come back for a follow-up appointment in 6 months.

## 2018-10-26 ENCOUNTER — Encounter: Payer: Self-pay | Admitting: Internal Medicine

## 2018-10-26 MED ORDER — METHIMAZOLE 5 MG PO TABS: ORAL_TABLET | ORAL | 1 refills | Status: DC

## 2018-10-27 LAB — THYROID STIMULATING IMMUNOGLOBULIN: TSI: 172 % baseline — ABNORMAL HIGH (ref <140)

## 2018-10-29 MED FILL — methIMAzole 5 MG TABS: 5 | 90 days supply | Qty: 180 | Fill #0

## 2018-11-27 MED FILL — CHLORTHALIDONE 25 MG TABS: 25 | 90 days supply | Qty: 45 | Fill #0

## 2018-12-17 ENCOUNTER — Other Ambulatory Visit: Payer: 59

## 2018-12-17 ENCOUNTER — Other Ambulatory Visit (INDEPENDENT_AMBULATORY_CARE_PROVIDER_SITE_OTHER): Payer: 59

## 2018-12-17 ENCOUNTER — Other Ambulatory Visit: Payer: Self-pay | Admitting: Internal Medicine

## 2018-12-17 DIAGNOSIS — E0500 Thyrotoxicosis with diffuse goiter without thyrotoxic crisis or storm: Secondary | ICD-10-CM | POA: Diagnosis not present

## 2018-12-17 LAB — TSH: TSH: 2.11 u[IU]/mL (ref 0.35–4.50)

## 2018-12-17 LAB — T4, FREE: Free T4: 0.75 ng/dL (ref 0.60–1.60)

## 2018-12-17 LAB — T3, FREE: T3, Free: 4.2 pg/mL (ref 2.3–4.2)

## 2018-12-26 ENCOUNTER — Other Ambulatory Visit: Payer: Self-pay | Admitting: Family Medicine

## 2018-12-26 DIAGNOSIS — Z1231 Encounter for screening mammogram for malignant neoplasm of breast: Secondary | ICD-10-CM

## 2019-01-15 MED FILL — LOSARTAN POTASSIUM 50 MG TA: 50 | 90 days supply | Qty: 90 | Fill #2

## 2019-01-28 ENCOUNTER — Other Ambulatory Visit: Payer: 59

## 2019-01-29 ENCOUNTER — Other Ambulatory Visit: Payer: 59

## 2019-01-29 ENCOUNTER — Other Ambulatory Visit (INDEPENDENT_AMBULATORY_CARE_PROVIDER_SITE_OTHER): Payer: 59

## 2019-01-29 DIAGNOSIS — E0500 Thyrotoxicosis with diffuse goiter without thyrotoxic crisis or storm: Secondary | ICD-10-CM | POA: Diagnosis not present

## 2019-01-29 LAB — T4, FREE: Free T4: 0.58 ng/dL — ABNORMAL LOW (ref 0.60–1.60)

## 2019-01-29 LAB — T3, FREE: T3, Free: 4.2 pg/mL (ref 2.3–4.2)

## 2019-01-29 LAB — TSH: TSH: 5.18 u[IU]/mL — ABNORMAL HIGH (ref 0.35–4.50)

## 2019-01-30 ENCOUNTER — Other Ambulatory Visit: Payer: Self-pay | Admitting: Internal Medicine

## 2019-01-30 DIAGNOSIS — E0500 Thyrotoxicosis with diffuse goiter without thyrotoxic crisis or storm: Secondary | ICD-10-CM

## 2019-01-30 MED ORDER — METHIMAZOLE 5 MG PO TABS: ORAL_TABLET | ORAL | 1 refills | Status: DC

## 2019-02-06 MED FILL — methIMAzole 5 MG TABS: 5 | 90 days supply | Qty: 180 | Fill #1

## 2019-02-11 ENCOUNTER — Ambulatory Visit
Admission: RE | Admit: 2019-02-11 | Discharge: 2019-02-11 | Disposition: A | Discharge status: Home / Self Care | Payer: 59 | Source: Ambulatory Visit | Attending: Family Medicine | Admitting: Family Medicine

## 2019-02-11 DIAGNOSIS — Z124 Encounter for screening for malignant neoplasm of cervix: Secondary | ICD-10-CM | POA: Diagnosis not present

## 2019-02-11 DIAGNOSIS — R87610 Atypical squamous cells of undetermined significance on cytologic smear of cervix (ASC-US): Secondary | ICD-10-CM | POA: Diagnosis not present

## 2019-02-11 DIAGNOSIS — Z23 Encounter for immunization: Secondary | ICD-10-CM | POA: Diagnosis not present

## 2019-02-11 DIAGNOSIS — I10 Essential (primary) hypertension: Secondary | ICD-10-CM | POA: Diagnosis not present

## 2019-02-11 DIAGNOSIS — Z1231 Encounter for screening mammogram for malignant neoplasm of breast: Secondary | ICD-10-CM

## 2019-02-14 MED FILL — CHLORTHALIDONE 25 MG TABS: 25 | 90 days supply | Qty: 45 | Fill #0 | Status: TO

## 2019-03-13 ENCOUNTER — Other Ambulatory Visit: Payer: Self-pay | Admitting: Internal Medicine

## 2019-03-13 ENCOUNTER — Other Ambulatory Visit (INDEPENDENT_AMBULATORY_CARE_PROVIDER_SITE_OTHER): Payer: 59

## 2019-03-13 DIAGNOSIS — E0500 Thyrotoxicosis with diffuse goiter without thyrotoxic crisis or storm: Secondary | ICD-10-CM

## 2019-03-13 LAB — T4, FREE: Free T4: 0.68 ng/dL (ref 0.60–1.60)

## 2019-03-13 LAB — TSH: TSH: 4.02 u[IU]/mL (ref 0.35–4.50)

## 2019-03-13 LAB — T3, FREE: T3, Free: 3.8 pg/mL (ref 2.3–4.2)

## 2019-03-13 MED ORDER — METHIMAZOLE 5 MG PO TABS: ORAL_TABLET | ORAL | 1 refills | Status: DC

## 2019-04-15 MED FILL — LOSARTAN POTASSIUM 50 MG TA: 50 | 30 days supply | Qty: 30 | Fill #0

## 2019-04-23 ENCOUNTER — Other Ambulatory Visit (INDEPENDENT_AMBULATORY_CARE_PROVIDER_SITE_OTHER): Payer: 59

## 2019-04-23 ENCOUNTER — Other Ambulatory Visit: Payer: Self-pay

## 2019-04-23 DIAGNOSIS — E0500 Thyrotoxicosis with diffuse goiter without thyrotoxic crisis or storm: Secondary | ICD-10-CM | POA: Diagnosis not present

## 2019-04-23 LAB — T3, FREE: T3, Free: 3.6 pg/mL (ref 2.3–4.2)

## 2019-04-23 LAB — T4, FREE: Free T4: 0.72 ng/dL (ref 0.60–1.60)

## 2019-04-23 LAB — TSH: TSH: 1.26 u[IU]/mL (ref 0.35–4.50)

## 2019-04-25 ENCOUNTER — Other Ambulatory Visit: Payer: Self-pay

## 2019-04-25 ENCOUNTER — Ambulatory Visit (INDEPENDENT_AMBULATORY_CARE_PROVIDER_SITE_OTHER): Payer: 59 | Admitting: Internal Medicine

## 2019-04-25 ENCOUNTER — Encounter: Payer: Self-pay | Admitting: Internal Medicine

## 2019-04-25 DIAGNOSIS — R635 Abnormal weight gain: Secondary | ICD-10-CM | POA: Diagnosis not present

## 2019-04-25 DIAGNOSIS — L659 Nonscarring hair loss, unspecified: Secondary | ICD-10-CM | POA: Diagnosis not present

## 2019-04-25 DIAGNOSIS — E0500 Thyrotoxicosis with diffuse goiter without thyrotoxic crisis or storm: Secondary | ICD-10-CM

## 2019-04-25 NOTE — Patient Instructions (Signed)
Please continue methimazole 5 mg 2x a day.  Please come back for labs in 2 to 3 months.  Please come back for a follow-up appointment in 6 months.

## 2019-04-25 NOTE — Progress Notes (Signed)
Patient ID: Samantha Peck, female   DOB: 07/08/1966, 53 y.o.   MRN: [id number]   Patient location: Home My location: Office  Referring Provider: Leonard Downing, MD  I connected with the patient on 04/25/19 at 10:36 AM EDT by a video enabled telemedicine application and verified that I am speaking with the correct person.   I discussed the limitations of evaluation and management by telemedicine and the availability of in person appointments. The patient expressed understanding and agreed to proceed.   Details of the encounter are shown below.  HPI  Samantha Peck is a 53 y.o.-year-old female, presenting for follow-up for thyrotoxicosis, likely secondary to Graves' disease.  Last visit 6 months ago.  Reviewed and addended history: She was dx with Graves ds. In 2010 (lost 15 lbs then)  >> started MMI >> but not compliant 2/2 lack of insurance >>  off the med completely since 11/2015. She started to have insurance again last year >> we restarted MMI.  We changed to methimazole 10 mg in am and 5 mg in pm in 01/2017 >> TFTs improved afterwards.  In 10/2017, we increase the dose of methimazole to 7.5 mg in a.m. and 5 mg in p.m.  However, next TSH was elevated, so we decreased the dose to 5 mg twice a day in 12/2017, and then 5 mg once a day and 02/2018.    In 04/2018, we increased her methimazole dose to 7.5 mg daily.  TFTs normalized on this dose.  We had to increase the dose to 5 mg twice a day in 10/2018 due to a suppressed TSH.  Afterwards, we decreased the dose of methimazole with the last change in 03/2019 to 2.5 mg daily.  On this dose, latest TFTs from 2 days ago were normal.  Reviewed patient's TFTs-last TSH was normal: Lab Results  Component Value Date   TSH 1.26 04/23/2019   TSH 4.02 03/13/2019   TSH 5.18 (H) 01/29/2019   TSH 2.11 12/17/2018   TSH 0.15 (L) 10/25/2018   TSH 0.85 08/06/2018   TSH 0.29 (L) 06/06/2018   TSH 0.10 (L) 04/23/2018   TSH 6.69 (H)  02/19/2018   TSH 10.38 (H) 12/22/2017   FREET4 0.72 04/23/2019   FREET4 0.68 03/13/2019   FREET4 0.58 (L) 01/29/2019   FREET4 0.75 12/17/2018   FREET4 0.99 10/25/2018   FREET4 0.79 08/06/2018   FREET4 1.17 06/06/2018   FREET4 0.96 04/23/2018   FREET4 0.57 (L) 02/19/2018   FREET4 0.48 (L) 12/22/2017    Lab Results  Component Value Date   T3FREE 3.6 04/23/2019   T3FREE 3.8 03/13/2019   T3FREE 4.2 01/29/2019   T3FREE 4.2 12/17/2018   T3FREE 4.6 (H) 10/25/2018   T3FREE 4.9 (H) 08/06/2018   T3FREE 4.6 (H) 06/06/2018   T3FREE 4.0 04/23/2018   T3FREE 3.4 02/19/2018   T3FREE 3.3 12/22/2017   TSI's continues to decrease: Lab Results  Component Value Date   TSI 172 (H) 10/25/2018   TSI 424 (H) 10/23/2017   TSI 258 (H) 08/24/2016   Pt denies: - feeling nodules in neck - hoarseness - dysphagia - choking - SOB with lying down  Pt does not have FH of thyroid ds. No FH of thyroid cancer. No h/o radiation tx to head or neck.  No herbal supplements. No Biotin use. No recent steroids use.   She has longstanding double vision- intermittently, when she is tired >> now a little worse. Dr. Keane Police.   ROS: Constitutional:  no weight gain/no weight loss, no fatigue, no subjective hyperthermia, no subjective hypothermia Eyes: no blurry vision, no xerophthalmia ENT: no sore throat, no nodules palpated in neck, no dysphagia, no odynophagia, no hoarseness Cardiovascular: no CP/no SOB/no palpitations/no leg swelling Respiratory: no cough/no SOB/no wheezing Gastrointestinal: no N/no V/no D/no C/no acid reflux Musculoskeletal: no muscle aches/no joint aches Skin: no rashes, no hair loss Neurological: no tremors/no numbness/no tingling/no dizziness  I reviewed pt's medications, allergies, PMH, social hx, family hx, and changes were documented in the history of present illness. Otherwise, unchanged from my initial visit note.  Past Medical History:  Diagnosis Date  . Cancer ([place])    skin   . Graves disease   . Hypertension   . Thyroid disease    Past Surgical History:  Procedure Laterality Date  . BREAST BIOPSY Right 2009   benign  . BUNIONECTOMY  1996   bilateral   Social History   Socioeconomic History  . Marital status: Married    Spouse name: Not on file  . Number of children: 3  . Years of education: 70  . Highest education level: Not on file  Occupational History  . Occupation: Buyer, retail: [employer]  . Financial resource strain: Not on file  . Food insecurity:    Worry: Not on file    Inability: Not on file  . Transportation needs:    Medical: Not on file    Non-medical: Not on file  Tobacco Use  . Smoking status: Current Some Day Smoker    Types: Cigarettes  . Smokeless tobacco: Never Used  . Tobacco comment: 1-2 cigarettes once in a while  Substance and Sexual Activity  . Alcohol use: Yes  . Drug use: No  . Sexual activity: Not on file  Lifestyle  . Physical activity:    Days per week: Not on file    Minutes per session: Not on file  . Stress: Not on file  Relationships  . Social connections:    Talks on phone: Not on file    Gets together: Not on file    Attends religious service: Not on file    Active member of club or organization: Not on file    Attends meetings of clubs or organizations: Not on file    Relationship status: Not on file  . Intimate partner violence:    Fear of current or ex partner: Not on file    Emotionally abused: Not on file    Physically abused: Not on file    Forced sexual activity: Not on file  Other Topics Concern  . Not on file  Social History Narrative   Regular exercise-no   Caffeine Use-yes   No Known Allergies Family History  Problem Relation Age of Onset  . Cancer Mother        Breast Cancer  . Diabetes Mother   . Heart disease Mother   . Hypertension Mother   . Breast cancer Mother   . Cancer Maternal Grandmother        Breast Cancer  . Breast cancer Maternal  Grandmother    Current Outpatient Medications  Medication Sig Dispense Refill  . BABY ASPIRIN PO Take daily by mouth.    . chlorthalidone (HYGROTON) 25 MG tablet   0  . losartan (COZAAR) 50 MG tablet Take 50 mg by mouth daily.    . methimazole (TAPAZOLE) 5 MG tablet Take 2.5 mg once a day. 45 tablet 1  No current facility-administered medications for this visit.    PE: There were no vitals taken for this visit. Wt Readings from Last 3 Encounters:  10/25/18 170 lb (77.1 kg)  04/23/18 167 lb (75.8 kg)  10/23/17 158 lb 4 oz (71.8 kg)   Constitutional:  in NAD  The physical exam was not performed (virtual visit).  ASSESSMENT: 1.  Graves' disease  2. Weight gain  3. Hair loss  PLAN:  1. Patient with history of Graves' disease, with initial thyrotoxic symptoms, now resolved.  We initially started methimazole at 10 mg in the morning and 5 mg in the p.m. and we were able to decrease the dose, however, she could not stay on the lower dose of methimazole and we have to increase the dose to 7.5 mg daily afterwards.  At last visit, thyroid tests were even worse, so we increased the dose of methimazole to 5 mg twice a day.  However, afterwards, the TSH increased and we had to decrease the dose of methimazole to 2.5 mg daily (she takes 1.25 mg twice a day).  TFTs obtained several days ago were normal on this dose. -She has no side effects from methimazole -She tells me she is compliant with all doses of the medication -She is asymptomatic with the exception of weight gain. -definitive treatment for Graves' disease including RAI treatment and surgery, but for now we will continue methimazole.  If we are not able to decrease the dose of methimazole to a minimum of 2.5 or 5 mg daily, I would suggest RAI treatment. -I will have her back for recheck of her TFTs in 2 to 3 months.   -I will see her back in clinic in 6 months  2. Weight gain - weight stable for now -She did well in the past on a  plant-based diet, but then fell off the wagon and she could not continue it.  She was reducing portions in the past and we discussed about including greens with every meal to keep her full  3. Hair loss -Continues, but not in large amt -At last visit we discussed about starting B vitamins.  She did not do so.  I again advised her to start a B complex with biotin but not to forget to stop these few days before checking TFTs as biotin can interfere with the thyroid tests.  Philemon Kingdom, MD PhD [facility]

## 2019-04-30 DIAGNOSIS — D485 Neoplasm of uncertain behavior of skin: Secondary | ICD-10-CM | POA: Diagnosis not present

## 2019-04-30 DIAGNOSIS — D2372 Other benign neoplasm of skin of left lower limb, including hip: Secondary | ICD-10-CM | POA: Diagnosis not present

## 2019-04-30 DIAGNOSIS — L821 Other seborrheic keratosis: Secondary | ICD-10-CM | POA: Diagnosis not present

## 2019-04-30 DIAGNOSIS — D2261 Melanocytic nevi of right upper limb, including shoulder: Secondary | ICD-10-CM | POA: Diagnosis not present

## 2019-04-30 DIAGNOSIS — D225 Melanocytic nevi of trunk: Secondary | ICD-10-CM | POA: Diagnosis not present

## 2019-04-30 DIAGNOSIS — L281 Prurigo nodularis: Secondary | ICD-10-CM | POA: Diagnosis not present

## 2019-04-30 DIAGNOSIS — L814 Other melanin hyperpigmentation: Secondary | ICD-10-CM | POA: Diagnosis not present

## 2019-04-30 DIAGNOSIS — D1801 Hemangioma of skin and subcutaneous tissue: Secondary | ICD-10-CM | POA: Diagnosis not present

## 2019-04-30 DIAGNOSIS — D2371 Other benign neoplasm of skin of right lower limb, including hip: Secondary | ICD-10-CM | POA: Diagnosis not present

## 2019-04-30 DIAGNOSIS — Z85828 Personal history of other malignant neoplasm of skin: Secondary | ICD-10-CM | POA: Diagnosis not present

## 2019-05-16 MED FILL — LOSARTAN POTASSIUM 50 MG TA: 50 | 30 days supply | Qty: 30 | Fill #1

## 2019-05-16 MED FILL — CHLORTHALIDONE 25 MG TABLET: 25 | 90 days supply | Qty: 45 | Fill #0

## 2019-06-07 DIAGNOSIS — L821 Other seborrheic keratosis: Secondary | ICD-10-CM | POA: Diagnosis not present

## 2019-06-07 DIAGNOSIS — D2239 Melanocytic nevi of other parts of face: Secondary | ICD-10-CM | POA: Diagnosis not present

## 2019-06-07 DIAGNOSIS — L720 Epidermal cyst: Secondary | ICD-10-CM | POA: Diagnosis not present

## 2019-06-07 DIAGNOSIS — Z85828 Personal history of other malignant neoplasm of skin: Secondary | ICD-10-CM | POA: Diagnosis not present

## 2019-06-07 MED FILL — MUPIROCIN 2% OINTMENT: 2 | 7 days supply | Qty: 22 | Fill #0

## 2019-06-17 MED FILL — LOSARTAN POTASSIUM 50 MG TA: 50 | 30 days supply | Qty: 30 | Fill #2

## 2019-07-15 MED FILL — LOSARTAN POTASSIUM 50 MG TA: 50 | 30 days supply | Qty: 30 | Fill #3

## 2019-07-26 ENCOUNTER — Other Ambulatory Visit: Payer: Self-pay

## 2019-07-26 ENCOUNTER — Other Ambulatory Visit: Payer: 59

## 2019-07-26 ENCOUNTER — Other Ambulatory Visit (INDEPENDENT_AMBULATORY_CARE_PROVIDER_SITE_OTHER): Payer: 59

## 2019-07-26 DIAGNOSIS — E0500 Thyrotoxicosis with diffuse goiter without thyrotoxic crisis or storm: Secondary | ICD-10-CM | POA: Diagnosis not present

## 2019-07-26 LAB — TSH: TSH: 0.14 u[IU]/mL — ABNORMAL LOW (ref 0.35–4.50)

## 2019-07-26 LAB — T4, FREE: Free T4: 1.03 ng/dL (ref 0.60–1.60)

## 2019-07-26 LAB — T3, FREE: T3, Free: 3.6 pg/mL (ref 2.3–4.2)

## 2019-07-28 ENCOUNTER — Other Ambulatory Visit: Payer: Self-pay | Admitting: Internal Medicine

## 2019-07-28 DIAGNOSIS — E0500 Thyrotoxicosis with diffuse goiter without thyrotoxic crisis or storm: Secondary | ICD-10-CM

## 2019-08-15 MED FILL — LOSARTAN POTASSIUM 50 MG TA: 50 | 30 days supply | Qty: 30 | Fill #4

## 2019-08-15 MED FILL — CHLORTHALIDONE 25 MG TABS: 25 | 90 days supply | Qty: 45 | Fill #1

## 2019-09-06 ENCOUNTER — Other Ambulatory Visit: Payer: Self-pay

## 2019-09-06 ENCOUNTER — Other Ambulatory Visit (INDEPENDENT_AMBULATORY_CARE_PROVIDER_SITE_OTHER): Payer: 59

## 2019-09-06 DIAGNOSIS — E0500 Thyrotoxicosis with diffuse goiter without thyrotoxic crisis or storm: Secondary | ICD-10-CM | POA: Diagnosis not present

## 2019-09-06 LAB — T3, FREE: T3, Free: 3.9 pg/mL (ref 2.3–4.2)

## 2019-09-06 LAB — T4, FREE: Free T4: 0.83 ng/dL (ref 0.60–1.60)

## 2019-09-06 LAB — TSH: TSH: 0.97 u[IU]/mL (ref 0.35–4.50)

## 2019-09-12 MED FILL — LOSARTAN POTASSIUM 50 MG TA: 50 | 30 days supply | Qty: 30 | Fill #5

## 2019-10-07 ENCOUNTER — Telehealth: Payer: Self-pay

## 2019-10-07 MED ORDER — METHIMAZOLE 5 MG PO TABS: 2.5000 mg | ORAL_TABLET | Freq: Every day | ORAL | 1 refills | Status: DC

## 2019-10-07 MED FILL — methIMAzole 5 MG TABS: 5 | 90 days supply | Qty: 45 | Fill #0

## 2019-10-07 NOTE — Telephone Encounter (Signed)
RX sent

## 2019-10-07 NOTE — Telephone Encounter (Signed)
Patient called in wanting to see if she could get a 90 day supply of methimazole (TAPAZOLE) 5 MG tablet. Her insurance ends on the 31st of this month and she wants something to last her until she gets more insurance    Please call and advise

## 2019-10-16 MED FILL — LOSARTAN POTASSIUM 50 MG TA: 50 | 30 days supply | Qty: 30 | Fill #6

## 2019-10-16 MED FILL — AMOXICILLIN 500 MG CAPSULE: 500 | 8 days supply | Qty: 24 | Fill #0

## 2019-10-16 MED FILL — IBUPROFEN 800 MG TAB: 800 | 8 days supply | Qty: 24 | Fill #0

## 2019-10-22 MED FILL — AMOXICILLIN 500 MG CAPSULE: 500 | 10 days supply | Qty: 30 | Fill #0

## 2019-10-29 ENCOUNTER — Other Ambulatory Visit: Payer: Self-pay

## 2019-10-31 ENCOUNTER — Encounter: Payer: Self-pay | Admitting: Internal Medicine

## 2019-10-31 ENCOUNTER — Ambulatory Visit (INDEPENDENT_AMBULATORY_CARE_PROVIDER_SITE_OTHER): Payer: 59 | Admitting: Internal Medicine

## 2019-10-31 VITALS — BP 118/70 | HR 88 | Ht 67.0 in | Wt 176.0 lb

## 2019-10-31 DIAGNOSIS — E0500 Thyrotoxicosis with diffuse goiter without thyrotoxic crisis or storm: Secondary | ICD-10-CM

## 2019-10-31 LAB — TSH: TSH: 3.5 u[IU]/mL (ref 0.35–4.50)

## 2019-10-31 LAB — T4, FREE: Free T4: 0.78 ng/dL (ref 0.60–1.60)

## 2019-10-31 LAB — T3, FREE: T3, Free: 4.6 pg/mL — ABNORMAL HIGH (ref 2.3–4.2)

## 2019-10-31 MED ORDER — METHIMAZOLE 5 MG PO TABS: 2.5000 mg | ORAL_TABLET | Freq: Two times a day (BID) | ORAL | 3 refills | Status: DC

## 2019-10-31 NOTE — Progress Notes (Signed)
Patient ID: Samantha Peck, female   DOB: 15-Sep-1966, 53 y.o.   MRN: [id number]   HPI  Samantha Peck is a 53 y.o.-year-old female, presenting for follow-up for thyrotoxicosis, likely secondary to Graves' disease.  Last visit 6 months ago.  Reviewed and addended history: She was dx with Graves ds. In 2010 (lost 15 lbs then)  >> started MMI >> but not compliant 2/2 lack of insurance >>  off the med completely since 11/2015. She started to have insurance again last year >> we restarted MMI.  We changed to methimazole 10 mg in am and 5 mg in pm in 01/2017 >> TFTs improved afterwards.  In 10/2017, we increase the dose of methimazole to 7.5 mg in a.m. and 5 mg in p.m.  However, next TSH was elevated, so we decreased the dose to 5 mg twice a day in 12/2017, and then 5 mg once a day and 02/2018.    In 04/2018, we increased her methimazole dose to 7.5 mg daily.  TFTs normalized on this dose.  We had to increase the dose to 5 mg twice a day in 10/2018 due to a suppressed TSH.  Afterwards, we decreased the dose of methimazole in 03/2019 to 2.5 mg daily.  However, we had to increase the dose to 5 mg daily in 07/2019.  Subsequent TFTs were normal.  Reviewed her TFTs: Lab Results  Component Value Date   TSH 0.97 09/06/2019   TSH 0.14 (L) 07/26/2019   TSH 1.26 04/23/2019   TSH 4.02 03/13/2019   TSH 5.18 (H) 01/29/2019   TSH 2.11 12/17/2018   TSH 0.15 (L) 10/25/2018   TSH 0.85 08/06/2018   TSH 0.29 (L) 06/06/2018   TSH 0.10 (L) 04/23/2018   FREET4 0.83 09/06/2019   FREET4 1.03 07/26/2019   FREET4 0.72 04/23/2019   FREET4 0.68 03/13/2019   FREET4 0.58 (L) 01/29/2019   FREET4 0.75 12/17/2018   FREET4 0.99 10/25/2018   FREET4 0.79 08/06/2018   FREET4 1.17 06/06/2018   FREET4 0.96 04/23/2018    Lab Results  Component Value Date   T3FREE 3.9 09/06/2019   T3FREE 3.6 07/26/2019   T3FREE 3.6 04/23/2019   T3FREE 3.8 03/13/2019   T3FREE 4.2 01/29/2019   T3FREE 4.2 12/17/2018   T3FREE  4.6 (H) 10/25/2018   T3FREE 4.9 (H) 08/06/2018   T3FREE 4.6 (H) 06/06/2018   T3FREE 4.0 04/23/2018   Her TSI is continue to decrease: Lab Results  Component Value Date   TSI 172 (H) 10/25/2018   TSI 424 (H) 10/23/2017   TSI 258 (H) 08/24/2016   Pt denies: - feeling nodules in neck - hoarseness - dysphagia - choking - SOB with lying down  Pt does not have FH of thyroid ds. No FH of thyroid cancer. No h/o radiation tx to head or neck.  No seaweed or kelp. No recent contrast studies. No herbal supplements. No Biotin use. No recent steroids use. She was on Amoxicillin -  for a tooth infection x 10 days  - finished 10 days ago.  She has longstanding double vision- intermittently, when she is tired >> now a little worse. Dr. Keane Police.   ROS: Constitutional: + weight gain/no weight loss, no fatigue, no subjective hyperthermia, no subjective hypothermia Eyes: no blurry vision, no xerophthalmia ENT: no sore throat, + see HPI Cardiovascular: no CP/no SOB/no palpitations/no leg swelling Respiratory: no cough/no SOB/no wheezing Gastrointestinal: no N/no V/no D/no C/no acid reflux Musculoskeletal: no muscle aches/no joint aches Skin:  no rashes, no hair loss Neurological: no tremors/no numbness/no tingling/no dizziness  I reviewed pt's medications, allergies, PMH, social hx, family hx, and changes were documented in the history of present illness. Otherwise, unchanged from my initial visit note.  Past Medical History:  Diagnosis Date  . Cancer ([place])    skin  . Graves disease   . Hypertension   . Thyroid disease    Past Surgical History:  Procedure Laterality Date  . BREAST BIOPSY Right 2009   benign  . BUNIONECTOMY  1996   bilateral   Social History   Socioeconomic History  . Marital status: Married    Spouse name: Not on file  . Number of children: 3  . Years of education: 11  . Highest education level: Not on file  Occupational History  . Occupation: Arts administrator: [employer]  . Financial resource strain: Not on file  . Food insecurity    Worry: Not on file    Inability: Not on file  . Transportation needs    Medical: Not on file    Non-medical: Not on file  Tobacco Use  . Smoking status: Current Some Day Smoker    Types: Cigarettes  . Smokeless tobacco: Never Used  . Tobacco comment: 1-2 cigarettes once in a while  Substance and Sexual Activity  . Alcohol use: Yes  . Drug use: No  . Sexual activity: Not on file  Lifestyle  . Physical activity    Days per week: Not on file    Minutes per session: Not on file  . Stress: Not on file  Relationships  . Social Herbalist on phone: Not on file    Gets together: Not on file    Attends religious service: Not on file    Active member of club or organization: Not on file    Attends meetings of clubs or organizations: Not on file    Relationship status: Not on file  . Intimate partner violence    Fear of current or ex partner: Not on file    Emotionally abused: Not on file    Physically abused: Not on file    Forced sexual activity: Not on file  Other Topics Concern  . Not on file  Social History Narrative   Regular exercise-no   Caffeine Use-yes   No Known Allergies Family History  Problem Relation Age of Onset  . Cancer Mother        Breast Cancer  . Diabetes Mother   . Heart disease Mother   . Hypertension Mother   . Breast cancer Mother   . Cancer Maternal Grandmother        Breast Cancer  . Breast cancer Maternal Grandmother    Current Outpatient Medications  Medication Sig Dispense Refill  . BABY ASPIRIN PO Take daily by mouth.    . chlorthalidone (HYGROTON) 25 MG tablet   0  . losartan (COZAAR) 50 MG tablet Take 50 mg by mouth daily.    . methimazole (TAPAZOLE) 5 MG tablet Take 0.5 tablets (2.5 mg total) by mouth daily. Take 2.5 mg once a day. 45 tablet 1   No current facility-administered medications for this visit.    PE: BP 118/70    Pulse 88   Ht 5\' 7"  (1.702 m)   Wt 176 lb (79.8 kg)   SpO2 97%   BMI 27.57 kg/m  Wt Readings from Last 3 Encounters:  10/31/19 176 lb (  79.8 kg)  10/25/18 170 lb (77.1 kg)  04/23/18 167 lb (75.8 kg)   Constitutional: overweight, in NAD Eyes: PERRLA, EOMI, no exophthalmos ENT: moist mucous membranes, no thyromegaly, no cervical lymphadenopathy Cardiovascular: RRR, No MRG Respiratory: CTA B Gastrointestinal: abdomen soft, NT, ND, BS+ Musculoskeletal: no deformities, strength intact in all 4 Skin: moist, warm, no rashes Neurological: no tremor with outstretched hands, DTR normal in all 4  ASSESSMENT: 1.  Graves' disease  PLAN:  1. Patient with history of Graves' disease, with initial thyrotoxic symptoms, now resolved.  We initially started methimazole 10 mg in the morning and 5 mg in the afternoon, we were able to decrease the dose, however, she could not stay on the lower dose of methimazole and we had to increase the dose afterwards.  Before last visit, we were able to decrease the dose to only 2.5 mg daily (1.25 mg twice a day).  Since then, we increased the dose back to 5 mg daily in 07/2019.  Subsequent TFTs were normal 08/2019. -Her TSI antibodies checked a year ago were still elevated, but improved -No side effects from methimazole, however, continues to have of weight gain -She is compliant with all doses of the medication -We again discussed about definitive treatment for Graves' disease, including continuing methimazole (she has been on this for 10 years), RAI ablation, surgery and we decided to continue with methimazole, since she is requiring a low dose medication.  We discussed that this is safe and effective in preventing Graves' disease recurrences -We will recheck her TFTs now -I will see her back in clinic in 6 months, but afterwards, we may space the visits out to yearly  - time spent with the patient: 15 minutes, of which >50% was spent in obtaining information about  her symptoms, reviewing her previous labs, evaluations, and treatments, counseling her about her condition (please see the discussed topics above), and developing a plan to further investigate and treat it; she had a number of questions which I addressed.  Needs refills.   Component     Latest Ref Rng & Units 10/31/2019  Triiodothyronine,Free,Serum     2.3 - 4.2 pg/mL 4.6 (H)  T4,Free(Direct)     0.60 - 1.60 ng/dL 0.78  TSH     0.35 - 4.50 uIU/mL 3.50   Free T3 slightly high.  For now, I would suggest to continue the current dose of methimazole and recheck her tests in 1.5 months.  At that time, we may be able to decrease the dose.  Philemon Kingdom, MD PhD [facility] End[facility]

## 2019-10-31 NOTE — Patient Instructions (Signed)
Please continue methimazole 2.5 mg 2x a day.  Please stop at the lab.  Please come back for a follow-up appointment in 6 months.

## 2019-11-01 ENCOUNTER — Telehealth: Payer: Self-pay | Admitting: Internal Medicine

## 2019-11-01 MED ORDER — METHIMAZOLE 5 MG PO TABS: 2.5000 mg | ORAL_TABLET | Freq: Two times a day (BID) | ORAL | 3 refills | Status: DC

## 2019-11-01 MED ORDER — METHIMAZOLE 5 MG PO TABS: ORAL_TABLET | ORAL | 3 refills | Status: DC

## 2019-11-01 NOTE — Telephone Encounter (Signed)
New RX with correct dose sent.

## 2019-11-01 NOTE — Telephone Encounter (Signed)
Samantha Peck with [name] requests to be called at ph# [phone] to get clarification of dosage instructions for methimazole

## 2019-11-13 MED FILL — CHLORTHALIDONE 25 MG TABS: 25 | 90 days supply | Qty: 45 | Fill #2

## 2019-11-13 MED FILL — methIMAzole 5 MG TABS: 5 | 90 days supply | Qty: 90 | Fill #0

## 2019-11-13 MED FILL — LOSARTAN POTASSIUM 50 MG TA: 50 | 30 days supply | Qty: 30 | Fill #7

## 2019-11-29 IMAGING — MG DIGITAL SCREENING BILATERAL MAMMOGRAM WITH TOMO AND CAD
6 of 10 series · 6 of 30 positions shown · non-contrast
Comparison: Previous exam(s).

CLINICAL DATA: Screening.

EXAM:
DIGITAL SCREENING BILATERAL MAMMOGRAM WITH TOMO AND CAD

[L CC synth-2D (1 of 2)]
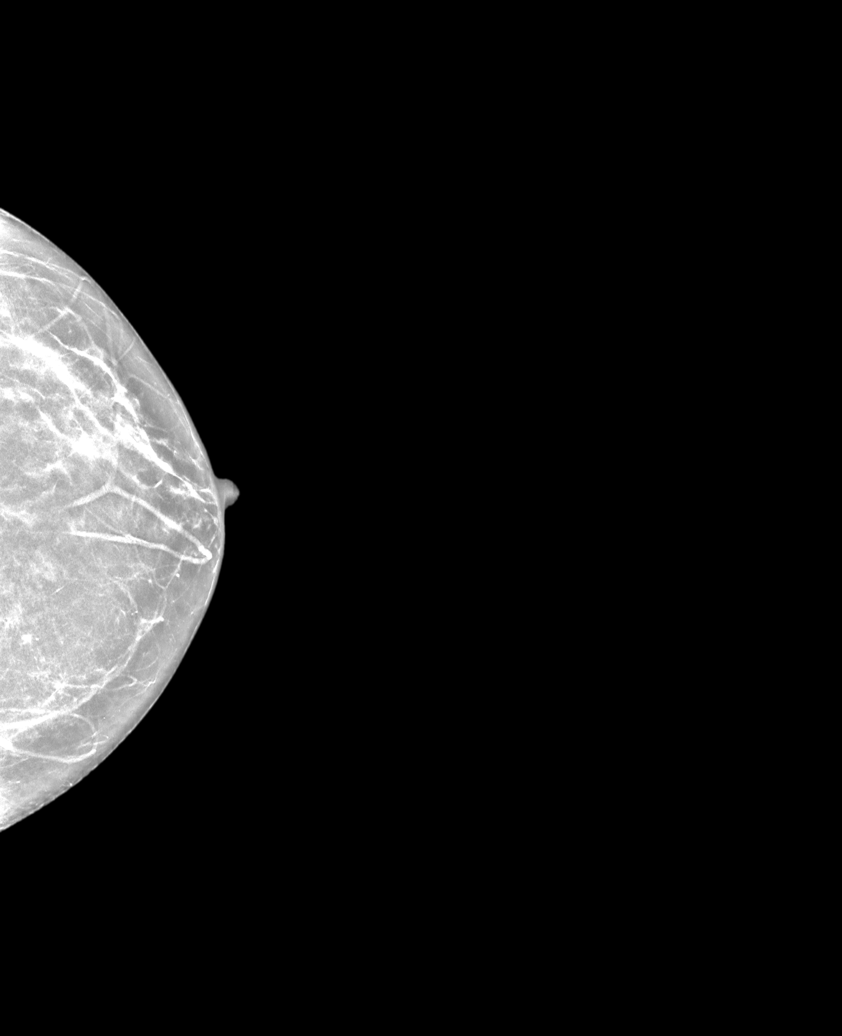

[R CC synth-2D]
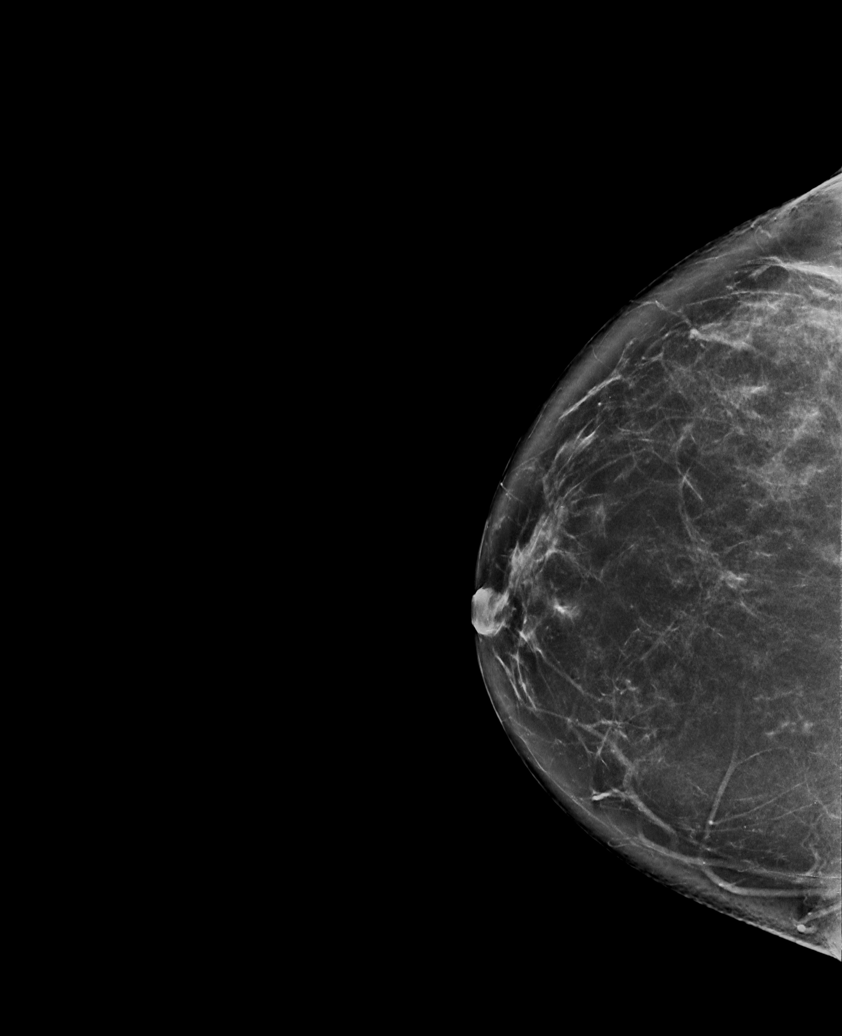

[L MLO synth-2D]
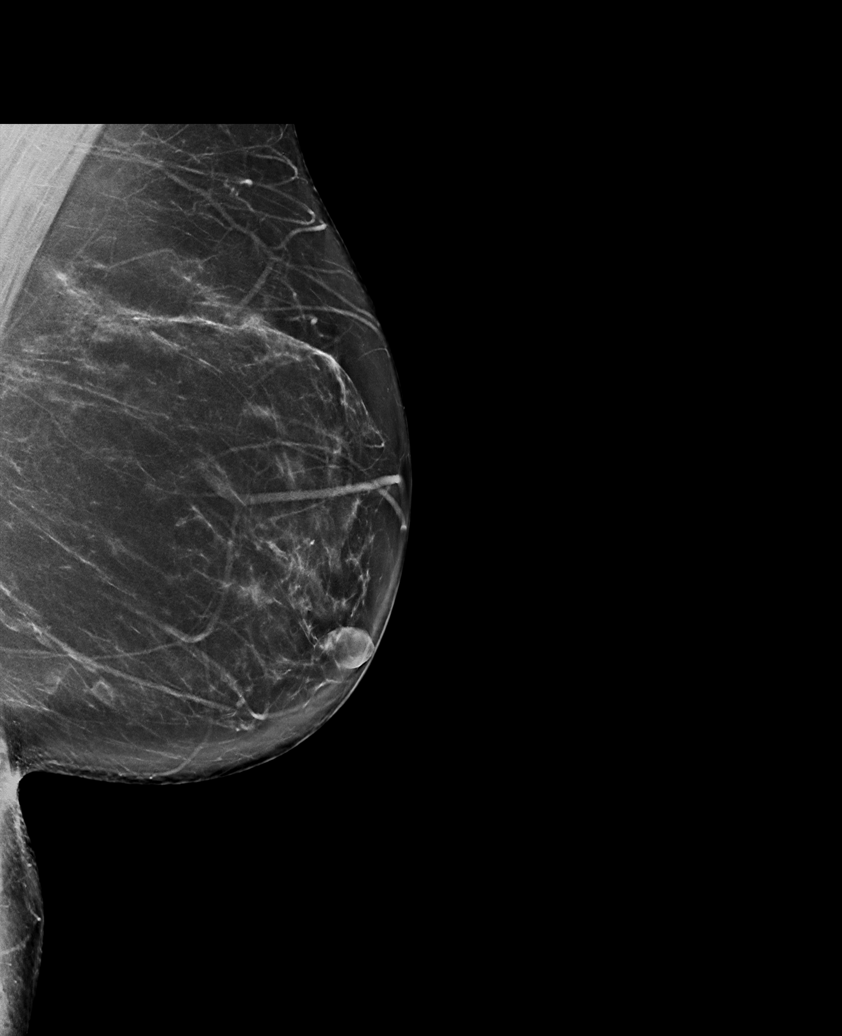

[L CC synth-2D (2 of 2)]
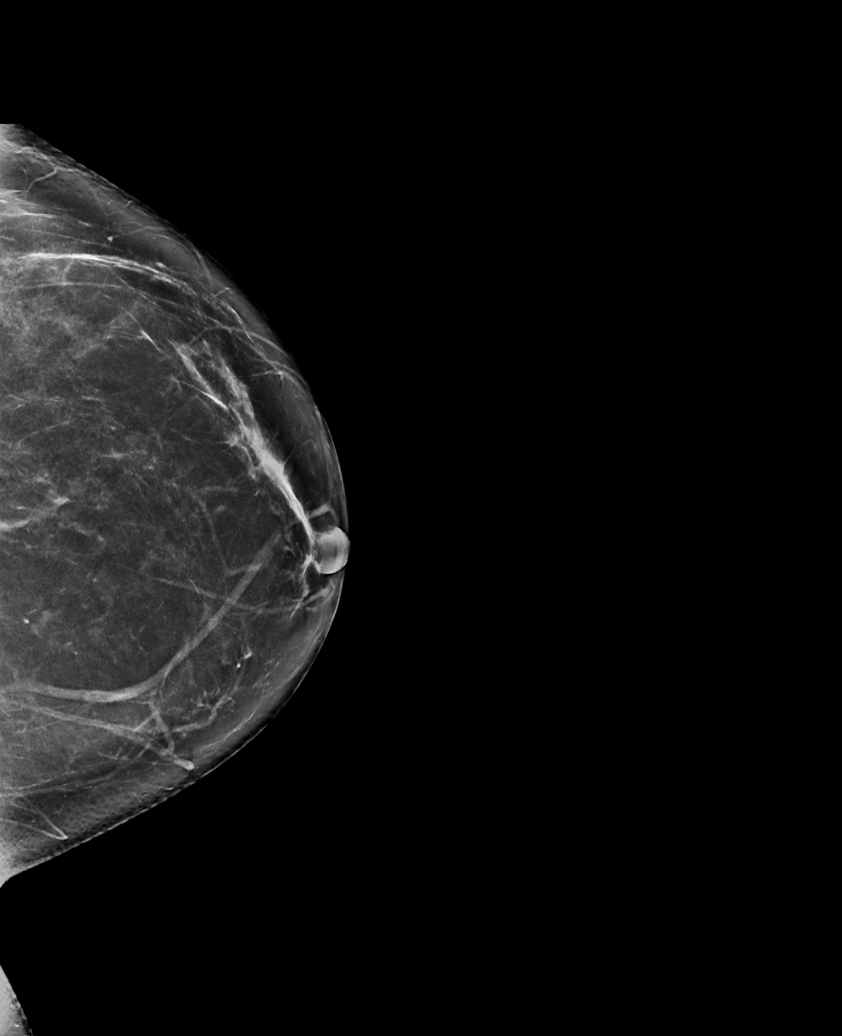

[R MLO synth-2D]
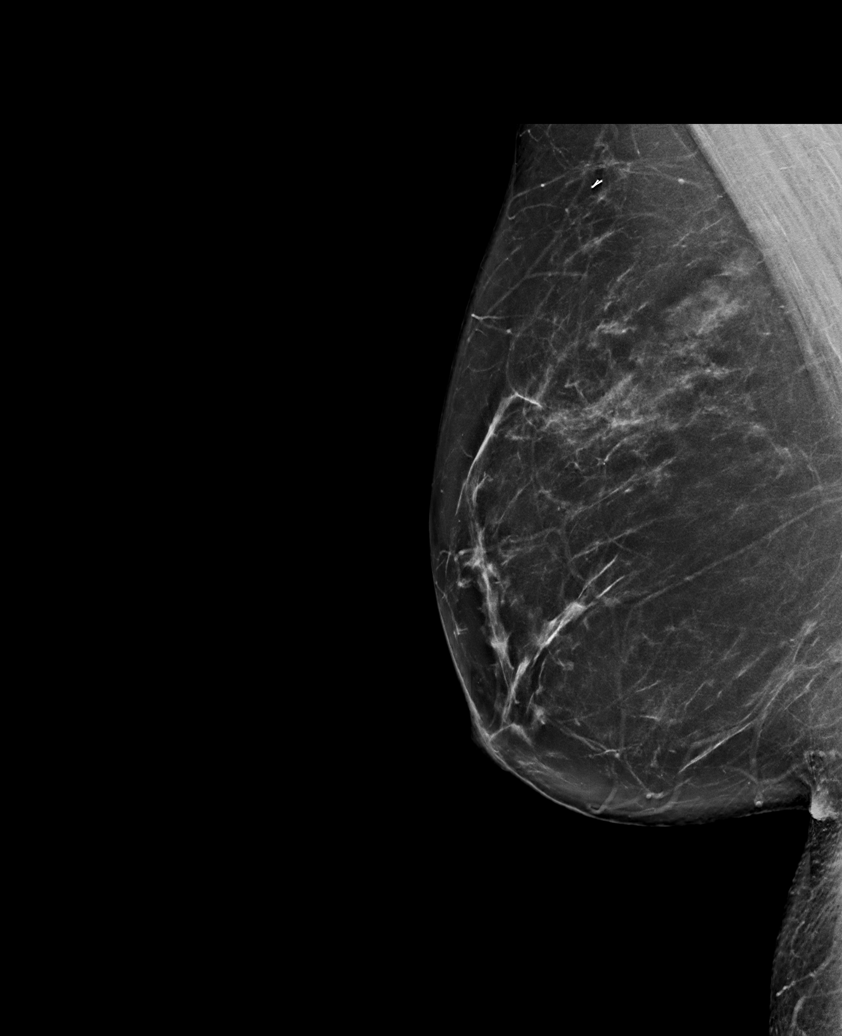

[R MLO tomo · tomo slice 43/86.0]
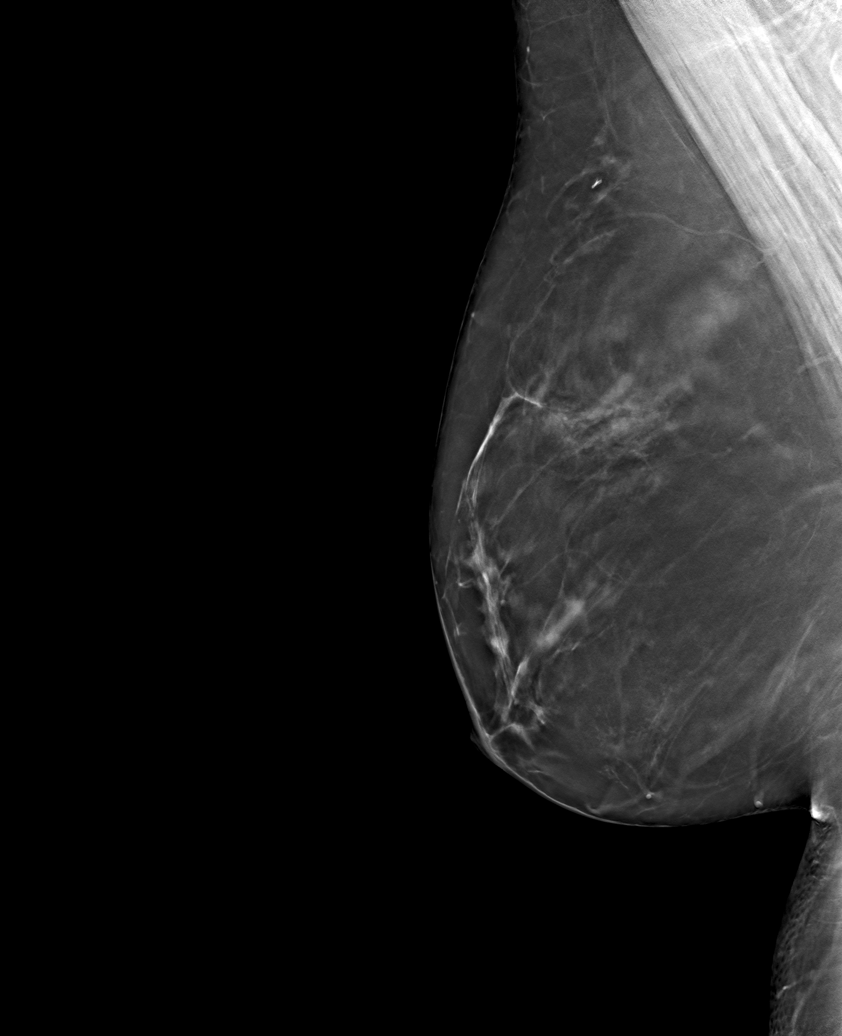

[6 of 30 positions shown; findings below may reference images not displayed]

ACR Breast Density Category b: There are scattered areas of
fibroglandular density.
FINDINGS: There are no findings suspicious for malignancy. Images were
processed with CAD.
IMPRESSION: No mammographic evidence of malignancy. A result letter of this
screening mammogram will be mailed directly to the patient.

RECOMMENDATION:
Screening mammogram in one year. (Code:[id number])

BI-RADS CATEGORY  1: Negative.

## 2019-12-11 ENCOUNTER — Other Ambulatory Visit (INDEPENDENT_AMBULATORY_CARE_PROVIDER_SITE_OTHER): Payer: 59

## 2019-12-11 ENCOUNTER — Other Ambulatory Visit: Payer: 59

## 2019-12-11 ENCOUNTER — Other Ambulatory Visit: Payer: Self-pay | Admitting: Internal Medicine

## 2019-12-11 DIAGNOSIS — E0500 Thyrotoxicosis with diffuse goiter without thyrotoxic crisis or storm: Secondary | ICD-10-CM

## 2019-12-11 LAB — T4, FREE: Free T4: 0.69 ng/dL (ref 0.60–1.60)

## 2019-12-11 LAB — T3, FREE: T3, Free: 3.9 pg/mL (ref 2.3–4.2)

## 2019-12-11 LAB — TSH: TSH: 2.75 u[IU]/mL (ref 0.35–4.50)

## 2019-12-16 MED FILL — LOSARTAN POTASSIUM 50 MG TA: 50 | 30 days supply | Qty: 30 | Fill #8

## 2019-12-18 MED FILL — PREVIDENT 5000 ORTHO DEFENS: 1.1 | 30 days supply | Qty: 200 | Fill #0

## 2020-01-02 ENCOUNTER — Encounter: Payer: 59 | Admitting: Obstetrics and Gynecology

## 2020-01-03 ENCOUNTER — Other Ambulatory Visit: Payer: Self-pay | Admitting: Family Medicine

## 2020-01-03 DIAGNOSIS — Z1231 Encounter for screening mammogram for malignant neoplasm of breast: Secondary | ICD-10-CM

## 2020-01-06 ENCOUNTER — Other Ambulatory Visit: Payer: Self-pay

## 2020-01-07 ENCOUNTER — Encounter: Payer: Self-pay | Admitting: Obstetrics and Gynecology

## 2020-01-07 ENCOUNTER — Ambulatory Visit: Payer: 59 | Admitting: Obstetrics and Gynecology

## 2020-01-07 ENCOUNTER — Other Ambulatory Visit (HOSPITAL_COMMUNITY)
Admission: RE | Admit: 2020-01-07 | Discharge: 2020-01-07 | Disposition: A | Discharge status: Home / Self Care | Payer: 59 | Source: Ambulatory Visit | Attending: Obstetrics and Gynecology | Admitting: Obstetrics and Gynecology

## 2020-01-07 VITALS — BP 114/72 | HR 68 | Temp 96.7°F | Resp 14 | Ht 67.0 in | Wt 181.8 lb

## 2020-01-07 DIAGNOSIS — Z8742 Personal history of other diseases of the female genital tract: Secondary | ICD-10-CM | POA: Diagnosis not present

## 2020-01-07 DIAGNOSIS — Z124 Encounter for screening for malignant neoplasm of cervix: Secondary | ICD-10-CM

## 2020-01-07 DIAGNOSIS — Z803 Family history of malignant neoplasm of breast: Secondary | ICD-10-CM

## 2020-01-07 DIAGNOSIS — R350 Frequency of micturition: Secondary | ICD-10-CM

## 2020-01-07 DIAGNOSIS — R635 Abnormal weight gain: Secondary | ICD-10-CM | POA: Diagnosis not present

## 2020-01-07 DIAGNOSIS — N816 Rectocele: Secondary | ICD-10-CM

## 2020-01-07 NOTE — Patient Instructions (Signed)
Human Papillomavirus Human papillomavirus (HPV) is the most common sexually transmitted infection (STI). It spreads easily from person to person (ishighly contagious). There are many types of HPV. It usually does not cause symptoms. However, sometimes it may cause wart-like lesions in the throat or warts in the genital area. It is possible to be infected for a long time and pass HPV to others without knowing it. Certain types of HPV may cause cancers, including cancer of the lower part of the uterus (cervix), vagina, outer female genital area (vulva), penis, anus, and rectum. HPV may also cause cancers of the oral cavity, such as the throat, tongue, and tonsils. What are the causes? HPV is caused by a virus that spreads from person to person through oral, vaginal, or anal sex. What increases the risk? You may be more likely to develop this condition if you have or have had:  Unprotected oral, vaginal, or anal sex.  Several sex partners.  A sex partner who has other sex partners.  Another STI.  A weak disease-fighting system (immune system).  Damaged skin in the genital, oral, or anal area. What are the signs or symptoms? Most people who have HPV do not have any symptoms. If symptoms are present, they may include:  Wart-like lesions in the throat (from having oral sex).  Warts on the infected skin or mucous membranes.  Genital warts that may itch, burn, bleed, or be painful during sex. How is this diagnosed? If you have wart-like lumps in the anal area or throat, or if genital warts are present, your health care provider can usually diagnose HPV with a physical exam. Genital warts are easily seen. In females, tests may be used to diagnose HPV, including:  A Pap test. A Pap test takes a sample of cells from the cervix to check for cancer and HPV infection.  An HPV test. This is similar to a Pap test and involves taking a sample of cells from the cervix.  Using a scope to view the  cervix (colposcopy). This may be done if a pelvic exam or Pap test is abnormal. A sample of tissue may be removed for testing (biopsy) during the colposcopy. Currently, there is no test to detect HPV in males. How is this treated? There is no treatment for the virus itself. However, there are treatments for the health problems and symptoms HPV can cause. Treatment for HPV may include:  Medicines in a cream, lotion, liquid, or gel form. These medicines may be injected into or applied to genital or anal warts.  Use of a probe to apply extreme cold (cryotherapy) to the genital or anal warts.  Application of an intense beam of light (laser treatment) on the genital or anal warts.  Use of a probe to apply extreme heat (electrocautery) on the genital or anal warts.  Surgery to remove the genital or anal warts. Your health care provider will monitor you closely after you are treated. HPV can come back and you may need treatment again. Follow these instructions at home: Medicines  Take over-the-counter and prescription medicines only as told by your health care provider. This include creams for itching or irritation.  Do not treat genital or anal warts with medicines used for treating hand warts. General instructions  Do not touch or scratch the warts.  Do not have sex while you are being treated.  Do not douche or use tampons during treatment (for women).  Tell your sex partner about your infection. He or she  may also need to be treated.  If you become pregnant, tell your health care provider that you have HPV. Your health care provider will monitor you closely during pregnancy to make sure you and your baby are safe.  Keep all follow-up visits as told by your health care provider. This is important. How is this prevented?  Talk with your health care provider about getting an HPV vaccine, which can prevent some HPV infections and related cancers. It will not work if you already have HPV  and is not recommended for pregnant women. You may need 2-3 doses of the vaccine, depending on your age.  After treatment, use condoms during sex to prevent future infections.  Have only one sex partner.  Have a sex partner who does not have other sex partners.  Get regular Pap tests as directed by your health care provider. Contact a health care provider if:  The treated skin becomes red, swollen, or painful.  You have a fever.  You feel generally ill.  You feel lumps or pimples in and around your genital or anal area.  You develop bleeding of the vagina or the treatment area.  You have painful sex. Summary  Human papillomavirus (HPV) is the most common sexually transmitted infection (STI) and is highly contagious.  Most people carrying HPV do not have any symptoms.  Many forms of HPV can be prevented with vaccination.  There is no treatment for the virus itself. However, there are treatments for the health problems and symptoms HPV can cause. This information is not intended to replace advice given to you by your health care provider. Make sure you discuss any questions you have with your health care provider. Document Revised: 08/06/2019 Document Reviewed: 07/26/2018 Elsevier Patient Education  [place].

## 2020-01-07 NOTE — Progress Notes (Signed)
GYNECOLOGY  VISIT   HPI: 54 y.o.   Married  Caucasian  female   G3P3 with Patient's last menstrual period was 09/12/2019 (approximate).   here for abnormal pap from 02-11-19(ASCUS pap) with PCP. She was advised to have repeat in 6 months.  She had an abnormal pap in her 32s but the follow up was normal.  No prior colposcopy or biopsy.  She brings in documentation of an ASCUS pap also on 11/30/12.  She is struggling with weight gain.  Reports a healthy diet.   She has nocturia and it up several times a night.   She works in Engineer, technical sales for [employer].  Urine dip - negative.   GYNECOLOGIC HISTORY: Patient's last menstrual period was 09/12/2019 (approximate). Contraception: Abstinence Menopausal hormone therapy:  n/a Last mammogram: 02-11-19 Neg/density B/BiRads1 Last pap smear:02-11-19 ASCUS,  11-12-11 Neg          OB History    Gravida  3   Para  3   Term      Preterm      AB      Living  3     SAB      TAB      Ectopic      Multiple      Live Births                 Patient Active Problem List   Diagnosis Date Noted  . Weight gain 04/23/2018  . Graves disease 07/13/2012    Past Medical History:  Diagnosis Date  . Cancer ([place])    skin  . Graves disease   . Hypertension   . Thyroid disease     Past Surgical History:  Procedure Laterality Date  . BREAST BIOPSY Right 2009   benign  . BUNIONECTOMY  1996   bilateral    Current Outpatient Medications  Medication Sig Dispense Refill  . chlorthalidone (HYGROTON) 25 MG tablet   0  . losartan (COZAAR) 50 MG tablet Take 50 mg by mouth daily.    . methimazole (TAPAZOLE) 5 MG tablet Take 2.5 mg two times a day. 90 tablet 3  . PREVIDENT 5000 ORTHO DEFENSE 1.1 % PSTE      No current facility-administered medications for this visit.     ALLERGIES: Patient has no known allergies.  Family History  Problem Relation Age of Onset  . Cancer Mother        Breast Cancer  . Diabetes Mother   . Heart disease  Mother   . Hypertension Mother   . Breast cancer Mother   . Cancer Maternal Grandmother        Breast Cancer  . Breast cancer Maternal Grandmother   . Hypertension Father     Social History   Socioeconomic History  . Marital status: Married    Spouse name: Not on file  . Number of children: 3  . Years of education: 34  . Highest education level: Not on file  Occupational History  . Occupation: Buyer, retail: [employer]  Tobacco Use  . Smoking status: Former Smoker    Types: Cigarettes    Quit date: 12/13/2007    Years since quitting: 12.0  . Smokeless tobacco: Never Used  . Tobacco comment: 1-2 cigarettes once in a while  Substance and Sexual Activity  . Alcohol use: Not Currently  . Drug use: No  . Sexual activity: Not Currently  Other Topics Concern  . Not on file  Social  History Narrative   Regular exercise-no   Caffeine Use-yes   Social Determinants of Health   Financial Resource Strain:   . Difficulty of Paying Living Expenses: Not on file  Food Insecurity:   . Worried About Charity fundraiser in the Last Year: Not on file  . Ran Out of Food in the Last Year: Not on file  Transportation Needs:   . Lack of Transportation (Medical): Not on file  . Lack of Transportation (Non-Medical): Not on file  Physical Activity:   . Days of Exercise per Week: Not on file  . Minutes of Exercise per Session: Not on file  Stress:   . Feeling of Stress : Not on file  Social Connections:   . Frequency of Communication with Friends and Family: Not on file  . Frequency of Social Gatherings with Friends and Family: Not on file  . Attends Religious Services: Not on file  . Active Member of Clubs or Organizations: Not on file  . Attends Archivist Meetings: Not on file  . Marital Status: Not on file  Intimate Partner Violence:   . Fear of Current or Ex-Partner: Not on file  . Emotionally Abused: Not on file  . Physically Abused: Not on file  . Sexually Abused:  Not on file    Review of Systems  Constitutional: Positive for unexpected weight change (weight gain).  All other systems reviewed and are negative.   PHYSICAL EXAMINATION:    BP 114/72 (Cuff Size: Large)   Pulse 68   Temp (!) 96.7 F (35.9 C) (Temporal)   Resp 14   Ht 5\' 7"  (1.702 m)   Wt 181 lb 12.8 oz (82.5 kg)   LMP 09/12/2019 (Approximate)   BMI 28.47 kg/m     General appearance: alert, cooperative and appears stated age Head: Normocephalic, without obvious abnormality, atraumatic Neck: no adenopathy, supple, symmetrical, trachea midline and thyroid globally enlarged. Lungs: clear to auscultation bilaterally Heart: regular rate and rhythm Abdomen: soft, non-tender, no masses,  no organomegaly Extremities: extremities normal, atraumatic, no cyanosis or edema Skin: Skin color, texture, turgor normal. No rashes or lesions No abnormal inguinal nodes palpated Neurologic: Grossly normal  Pelvic: External genitalia:  no lesions              Urethra:  normal appearing urethra with no masses, tenderness or lesions              Bartholins and Skenes: normal                 Vagina: normal appearing vagina with normal color and discharge, no lesions.  Second degree rectocele noted.               Cervix: no lesions                Bimanual Exam:  Uterus:  normal size, contour, position, consistency, mobility, non-tender              Adnexa: no mass, fullness, tenderness           Chaperone was present for exam.  ASSESSMENT  Cervical cancer screening.  Hx ASCUS paps. FH breast cancer - mother and maternal grandmother.  Rectocele.  Urinary frequency.  Weight gain.  PLAN  We discussed abnormal paps, HPV testing, colposcopy, and treatment.  Gardasil vaccine reviewed.  Pap and HR HPV collected today.  We discussed potential genetic counseling and testing.  She will consider.  She does yearly 3D mammograms.  We discussed her rectocele, risk factors and treatment options  for physical therapy and surgery.   We discussed bladder irritants and she will work to reduce this.  I recommend a vigorous exercise program to assist with weight loss.  Annual exam is due in March.    An After Visit Summary was printed and given to the patient.  __30____ minutes face to face time of which over 50% was spent in counseling.

## 2020-01-09 LAB — CYTOLOGY - PAP
Comment: NEGATIVE
Diagnosis: NEGATIVE
High risk HPV: NEGATIVE

## 2020-01-22 MED FILL — LOSARTAN POTASSIUM 50 MG TA: 50 | 90 days supply | Qty: 90 | Fill #0

## 2020-01-29 ENCOUNTER — Other Ambulatory Visit: Payer: Self-pay | Admitting: Internal Medicine

## 2020-01-29 ENCOUNTER — Other Ambulatory Visit: Payer: 59

## 2020-01-29 ENCOUNTER — Other Ambulatory Visit (INDEPENDENT_AMBULATORY_CARE_PROVIDER_SITE_OTHER): Payer: 59

## 2020-01-29 DIAGNOSIS — E0500 Thyrotoxicosis with diffuse goiter without thyrotoxic crisis or storm: Secondary | ICD-10-CM | POA: Diagnosis not present

## 2020-01-29 LAB — T3, FREE: T3, Free: 3.9 pg/mL (ref 2.3–4.2)

## 2020-01-29 LAB — T4, FREE: Free T4: 0.79 ng/dL (ref 0.60–1.60)

## 2020-01-29 LAB — TSH: TSH: 2.72 u[IU]/mL (ref 0.35–4.50)

## 2020-01-29 MED ORDER — METHIMAZOLE 5 MG PO TABS: ORAL_TABLET | ORAL | 3 refills | Status: DC

## 2020-02-01 MED FILL — CHLORTHALIDONE 25 MG TABS: 25 | 90 days supply | Qty: 45 | Fill #3

## 2020-02-12 ENCOUNTER — Ambulatory Visit
Admission: RE | Admit: 2020-02-12 | Discharge: 2020-02-12 | Disposition: A | Discharge status: Home / Self Care | Payer: 59 | Source: Ambulatory Visit | Attending: Family Medicine | Admitting: Family Medicine

## 2020-02-12 ENCOUNTER — Other Ambulatory Visit: Payer: Self-pay

## 2020-02-12 DIAGNOSIS — Z1231 Encounter for screening mammogram for malignant neoplasm of breast: Secondary | ICD-10-CM | POA: Diagnosis not present

## 2020-03-11 ENCOUNTER — Other Ambulatory Visit: Payer: 59

## 2020-03-11 ENCOUNTER — Other Ambulatory Visit (INDEPENDENT_AMBULATORY_CARE_PROVIDER_SITE_OTHER): Payer: 59

## 2020-03-11 DIAGNOSIS — E0500 Thyrotoxicosis with diffuse goiter without thyrotoxic crisis or storm: Secondary | ICD-10-CM

## 2020-03-11 LAB — T4, FREE: Free T4: 0.8 ng/dL (ref 0.60–1.60)

## 2020-03-11 LAB — T3, FREE: T3, Free: 4 pg/mL (ref 2.3–4.2)

## 2020-03-11 LAB — TSH: TSH: 1.09 u[IU]/mL (ref 0.35–4.50)

## 2020-03-16 MED FILL — CLINDAMYCIN HCL 300 MG CAPS: 300 | 9 days supply | Qty: 21 | Fill #0

## 2020-03-16 MED FILL — HYDROCODON-APAP 5-325: 5-325 | 4 days supply | Qty: 15 | Fill #0

## 2020-03-23 MED FILL — methIMAzole 5 MG TABS: 5 | 90 days supply | Qty: 90 | Fill #1

## 2020-03-30 MED FILL — NYSTATIN 500,000 UNIT ORAL: 500000 | 10 days supply | Qty: 30 | Fill #0

## 2020-04-24 ENCOUNTER — Other Ambulatory Visit (HOSPITAL_COMMUNITY): Payer: Self-pay | Admitting: Nurse Practitioner

## 2020-04-24 DIAGNOSIS — I10 Essential (primary) hypertension: Secondary | ICD-10-CM | POA: Diagnosis not present

## 2020-04-24 DIAGNOSIS — R945 Abnormal results of liver function studies: Secondary | ICD-10-CM | POA: Diagnosis not present

## 2020-04-24 DIAGNOSIS — Z0000 Encounter for general adult medical examination without abnormal findings: Secondary | ICD-10-CM | POA: Diagnosis not present

## 2020-04-29 ENCOUNTER — Encounter: Payer: Self-pay | Admitting: Internal Medicine

## 2020-04-29 ENCOUNTER — Ambulatory Visit: Payer: 59 | Admitting: Internal Medicine

## 2020-04-29 ENCOUNTER — Other Ambulatory Visit: Payer: Self-pay

## 2020-04-29 VITALS — BP 110/62 | HR 77 | Ht 67.0 in | Wt 173.0 lb

## 2020-04-29 DIAGNOSIS — E0500 Thyrotoxicosis with diffuse goiter without thyrotoxic crisis or storm: Secondary | ICD-10-CM

## 2020-04-29 LAB — T4, FREE: Free T4: 0.81 ng/dL (ref 0.60–1.60)

## 2020-04-29 LAB — TSH: TSH: 1.72 u[IU]/mL (ref 0.35–4.50)

## 2020-04-29 LAB — T3, FREE: T3, Free: 3.6 pg/mL (ref 2.3–4.2)

## 2020-04-29 NOTE — Progress Notes (Addendum)
ThyroidPatient ID: Samantha Peck, female   DOB: 02-17-1966, 54 y.o.   MRN: [id number]   This visit occurred during the SARS-CoV-2 public health emergency.  Safety protocols were in place, including screening questions prior to the visit, additional usage of staff PPE, and extensive cleaning of exam room while observing appropriate contact time as indicated for disinfecting solutions.   HPI  Samantha Peck is a 54 y.o.-year-old female, presenting for follow-up for thyrotoxicosis, likely secondary to Graves' disease.  Last visit 6 months ago.  Reviewed and addended history: She was dx with Graves ds. In 2010 (lost 15 lbs then)  >> started MMI >> but not compliant 2/2 lack of insurance >>  off the med completely since 11/2015. She started to have insurance again last year >> we restarted MMI.  We changed to methimazole 10 mg in am and 5 mg in pm in 01/2017 >> TFTs improved afterwards.  In 10/2017, we increase the dose of methimazole to 7.5 mg in a.m. and 5 mg in p.m.  However, next TSH was elevated, so we decreased the dose to 5 mg twice a day in 12/2017, and then 5 mg once a day and 02/2018.    In 04/2018, we increased her methimazole dose to 7.5 mg daily.  TFTs normalized on this dose.  We had to increase the dose to 5 mg twice a day in 10/2018 due to a suppressed TSH.  Afterwards, we decreased the dose of methimazole in 03/2019 to 2.5 mg daily.  However, we had to increase the dose to 5 mg daily in 07/2019.  Subsequent TFTs were normal.  In 01/2020, we decreased the dose further to 2.5 mg daily.  Reviewed her TFTs: Lab Results  Component Value Date   TSH 1.09 03/11/2020   TSH 2.72 01/29/2020   TSH 2.75 12/11/2019   TSH 3.50 10/31/2019   TSH 0.97 09/06/2019   TSH 0.14 (L) 07/26/2019   TSH 1.26 04/23/2019   TSH 4.02 03/13/2019   TSH 5.18 (H) 01/29/2019   TSH 2.11 12/17/2018   FREET4 0.80 03/11/2020   FREET4 0.79 01/29/2020   FREET4 0.69 12/11/2019   FREET4 0.78 10/31/2019    FREET4 0.83 09/06/2019   FREET4 1.03 07/26/2019   FREET4 0.72 04/23/2019   FREET4 0.68 03/13/2019   FREET4 0.58 (L) 01/29/2019   FREET4 0.75 12/17/2018    Lab Results  Component Value Date   T3FREE 4.0 03/11/2020   T3FREE 3.9 01/29/2020   T3FREE 3.9 12/11/2019   T3FREE 4.6 (H) 10/31/2019   T3FREE 3.9 09/06/2019   T3FREE 3.6 07/26/2019   T3FREE 3.6 04/23/2019   T3FREE 3.8 03/13/2019   T3FREE 4.2 01/29/2019   T3FREE 4.2 12/17/2018   Her TSI's continue to decrease: Lab Results  Component Value Date   TSI 172 (H) 10/25/2018   TSI 424 (H) 10/23/2017   TSI 258 (H) 08/24/2016   Pt denies: - feeling nodules in neck - hoarseness - dysphagia - choking - SOB with lying down  Pt does not have FH of thyroid ds. No FH of thyroid cancer. No h/o radiation tx to head or neck.  No seaweed or kelp. No recent contrast studies. No herbal supplements. No Biotin use. No recent steroids use.   She has longstanding double vision-intermittently, when tired. Dr. Keane Police.   ROS: Constitutional: no weight gain/no weight loss, no fatigue, no subjective hyperthermia, no subjective hypothermia Eyes: no blurry vision, no xerophthalmia ENT: no sore throat, + see HPI Cardiovascular: no  CP/no SOB/+ palpitations - seldom/no leg swelling Respiratory: no cough/no SOB/no wheezing Gastrointestinal: no N/no V/no D/no C/no acid reflux Musculoskeletal: no muscle aches/no joint aches Skin: no rashes, no hair loss Neurological: no tremors/no numbness/no tingling/no dizziness  I reviewed pt's medications, allergies, PMH, social hx, family hx, and changes were documented in the history of present illness. Otherwise, unchanged from my initial visit note.  Past Medical History:  Diagnosis Date  . Cancer ([place])    skin  . Graves disease   . Hypertension   . Thyroid disease    Past Surgical History:  Procedure Laterality Date  . BREAST BIOPSY Right 2009   fibroadenoma  . BUNIONECTOMY  1996    bilateral   Social History   Socioeconomic History  . Marital status: Married    Spouse name: Not on file  . Number of children: 3  . Years of education: 87  . Highest education level: Not on file  Occupational History  . Occupation: Buyer, retail: [employer]  Tobacco Use  . Smoking status: Former Smoker    Types: Cigarettes    Quit date: 12/13/2007    Years since quitting: 12.3  . Smokeless tobacco: Never Used  . Tobacco comment: 1-2 cigarettes once in a while  Substance and Sexual Activity  . Alcohol use: Not Currently  . Drug use: No  . Sexual activity: Not Currently  Other Topics Concern  . Not on file  Social History Narrative   Regular exercise-no   Caffeine Use-yes   Social Determinants of Health   Financial Resource Strain:   . Difficulty of Paying Living Expenses:   Food Insecurity:   . Worried About Charity fundraiser in the Last Year:   . Arboriculturist in the Last Year:   Transportation Needs:   . Film/video editor (Medical):   Marland Kitchen Lack of Transportation (Non-Medical):   Physical Activity:   . Days of Exercise per Week:   . Minutes of Exercise per Session:   Stress:   . Feeling of Stress :   Social Connections:   . Frequency of Communication with Friends and Family:   . Frequency of Social Gatherings with Friends and Family:   . Attends Religious Services:   . Active Member of Clubs or Organizations:   . Attends Archivist Meetings:   Marland Kitchen Marital Status:   Intimate Partner Violence:   . Fear of Current or Ex-Partner:   . Emotionally Abused:   Marland Kitchen Physically Abused:   . Sexually Abused:    No Known Allergies Family History  Problem Relation Age of Onset  . Cancer Mother        Breast Cancer  . Diabetes Mother   . Heart disease Mother   . Hypertension Mother   . Breast cancer Mother   . Cancer Maternal Grandmother        Breast Cancer  . Breast cancer Maternal Grandmother   . Hypertension Father    Current Outpatient  Medications  Medication Sig Dispense Refill  . chlorthalidone (HYGROTON) 25 MG tablet   0  . losartan (COZAAR) 50 MG tablet Take 50 mg by mouth daily.    . methimazole (TAPAZOLE) 5 MG tablet Take 2.5 mg once a day. 45 tablet 3  . PREVIDENT 5000 ORTHO DEFENSE 1.1 % PSTE      No current facility-administered medications for this visit.   PE: BP 110/62   Pulse 77   Ht 5\' 7"  (1.702  m)   Wt 173 lb (78.5 kg)   SpO2 92%   BMI 27.10 kg/m  Wt Readings from Last 3 Encounters:  04/29/20 173 lb (78.5 kg)  01/07/20 181 lb 12.8 oz (82.5 kg)  10/31/19 176 lb (79.8 kg)   Constitutional: slightly overweight, in NAD Eyes: PERRLA, EOMI, no exophthalmos ENT: moist mucous membranes, no thyromegaly, no cervical lymphadenopathy Cardiovascular: RRR, No MRG Respiratory: CTA B Gastrointestinal: abdomen soft, NT, ND, BS+ Musculoskeletal: no deformities, strength intact in all 4 Skin: moist, warm, no rashes Neurological: no tremor with outstretched hands, DTR normal in all 4  ASSESSMENT: 1.  Graves' disease  PLAN:  1. Patient with history of Graves' disease, with initial thyrotoxic symptoms, now resolved.  We initially started methimazole 10 mg in the morning and 5 mg in the afternoon we were able to gradually decrease the dose, however, we could not decrease the dose.  Lower than 2.5 mg daily in the past.  In 07/2019, we increase the dose back to 5 mg daily and tests normalized afterwards.  We then decrease the dose again to 2.5 mg daily in 01/2020.  Subsequent TFTs were normal on 03/11/2020. -Her thyroid antibodies were still elevated but significantly decreased at last check in 2019.  We will repeat these today. -She is very compliant with the medication, not missing doses -I do not feel that she requires RAI treatment or thyroidectomy for now. -At today's visit, we will check her TFTs and her TSI's -I will see her back in a year but with labs in 6 months.  Office Visit on 04/29/2020  Component  Date Value Ref Range Status  . TSH 04/29/2020 1.72  0.35 - 4.50 uIU/mL Final  . Free T4 04/29/2020 0.81  0.60 - 1.60 ng/dL Final   Comment: Specimens from patients who are undergoing biotin therapy and /or ingesting biotin supplements may contain high levels of biotin.  The higher biotin concentration in these specimens interferes with this Free T4 assay.  Specimens that contain high levels  of biotin may cause false high results for this Free T4 assay.  Please interpret results in light of the total clinical presentation of the patient.    . T3, Free 04/29/2020 3.6  2.3 - 4.2 pg/mL Final   Thyroid tests are normal. We will continue the current dose of methimazole.  Component     Latest Ref Rng & Units 04/29/2020  TSI     <140 % baseline 132  TSI normal.  Philemon Kingdom, MD PhD [facility] Endocrinology

## 2020-04-29 NOTE — Patient Instructions (Signed)
Please continue methimazole 2.5 mg daily.  Please stop at the lab.  Please come back for a follow-up appointment in 1 year, but with labs at 6 months.

## 2020-05-03 LAB — THYROID STIMULATING IMMUNOGLOBULIN: TSI: 132 % baseline (ref <140)

## 2020-05-13 ENCOUNTER — Encounter: Payer: Self-pay | Admitting: Internal Medicine

## 2020-05-13 NOTE — Progress Notes (Signed)
Received labs drawn on 04/24/2020: -CMP normal with the exception of glucose of 102 and alkaline phosphatase of 129 (39-170).  BUN/creatinine 14/0.79, GFR 86

## 2020-07-26 MED FILL — LOSARTAN POTASSIUM 50 MG TA: 50 | 90 days supply | Qty: 90 | Fill #1

## 2020-08-12 MED FILL — CHLORTHALIDONE 25 MG TABS: 25 | 90 days supply | Qty: 45 | Fill #1

## 2020-09-24 MED FILL — methIMAzole 5 MG TABS: 5 | 90 days supply | Qty: 90 | Fill #2

## 2020-10-15 ENCOUNTER — Other Ambulatory Visit (HOSPITAL_COMMUNITY): Payer: Self-pay | Admitting: Physician Assistant

## 2020-10-15 ENCOUNTER — Other Ambulatory Visit: Payer: Self-pay | Admitting: Physician Assistant

## 2020-10-15 DIAGNOSIS — R1011 Right upper quadrant pain: Secondary | ICD-10-CM | POA: Diagnosis not present

## 2020-10-15 DIAGNOSIS — R1310 Dysphagia, unspecified: Secondary | ICD-10-CM | POA: Diagnosis not present

## 2020-10-15 DIAGNOSIS — K5900 Constipation, unspecified: Secondary | ICD-10-CM | POA: Diagnosis not present

## 2020-10-15 MED FILL — PANTOPRAZOLE SOD DR 40 MG T: 40 | 90 days supply | Qty: 90 | Fill #0

## 2020-10-23 MED FILL — LOSARTAN POTASSIUM 50 MG TA: 50 | 90 days supply | Qty: 90 | Fill #2

## 2020-10-28 ENCOUNTER — Ambulatory Visit
Admission: RE | Admit: 2020-10-28 | Discharge: 2020-10-28 | Disposition: A | Discharge status: Home / Self Care | Payer: 59 | Source: Ambulatory Visit | Attending: Physician Assistant | Admitting: Physician Assistant

## 2020-10-28 DIAGNOSIS — R1011 Right upper quadrant pain: Secondary | ICD-10-CM

## 2020-10-30 ENCOUNTER — Other Ambulatory Visit: Payer: 59

## 2020-10-30 ENCOUNTER — Other Ambulatory Visit: Payer: Self-pay | Admitting: Internal Medicine

## 2020-10-30 ENCOUNTER — Other Ambulatory Visit (INDEPENDENT_AMBULATORY_CARE_PROVIDER_SITE_OTHER): Payer: 59

## 2020-10-30 DIAGNOSIS — E0500 Thyrotoxicosis with diffuse goiter without thyrotoxic crisis or storm: Secondary | ICD-10-CM | POA: Diagnosis not present

## 2020-10-30 LAB — TSH: TSH: 0.94 u[IU]/mL (ref 0.35–4.50)

## 2020-10-30 LAB — T3, FREE: T3, Free: 4 pg/mL (ref 2.3–4.2)

## 2020-10-30 LAB — T4, FREE: Free T4: 0.9 ng/dL (ref 0.60–1.60)

## 2020-11-06 MED FILL — CHLORTHALIDONE 25 MG TABS: 25 | 90 days supply | Qty: 45 | Fill #2

## 2020-11-10 DIAGNOSIS — R1011 Right upper quadrant pain: Secondary | ICD-10-CM | POA: Diagnosis not present

## 2020-11-10 DIAGNOSIS — K219 Gastro-esophageal reflux disease without esophagitis: Secondary | ICD-10-CM | POA: Diagnosis not present

## 2020-11-10 DIAGNOSIS — K760 Fatty (change of) liver, not elsewhere classified: Secondary | ICD-10-CM | POA: Diagnosis not present

## 2020-12-29 ENCOUNTER — Other Ambulatory Visit: Payer: Self-pay | Admitting: Family Medicine

## 2020-12-29 DIAGNOSIS — Z1231 Encounter for screening mammogram for malignant neoplasm of breast: Secondary | ICD-10-CM

## 2021-01-18 MED FILL — LOSARTAN POTASSIUM 50 MG TA: 50 | 90 days supply | Qty: 90 | Fill #3

## 2021-01-18 MED FILL — CHLORTHALIDONE 25 MG TABS: 25 | 90 days supply | Qty: 45 | Fill #3

## 2021-02-08 DIAGNOSIS — R5383 Other fatigue: Secondary | ICD-10-CM | POA: Diagnosis not present

## 2021-02-08 DIAGNOSIS — I10 Essential (primary) hypertension: Secondary | ICD-10-CM | POA: Diagnosis not present

## 2021-02-08 DIAGNOSIS — N390 Urinary tract infection, site not specified: Secondary | ICD-10-CM | POA: Diagnosis not present

## 2021-02-12 ENCOUNTER — Other Ambulatory Visit: Payer: Self-pay

## 2021-02-12 ENCOUNTER — Ambulatory Visit
Admission: RE | Admit: 2021-02-12 | Discharge: 2021-02-12 | Disposition: A | Discharge status: Home / Self Care | Payer: 59 | Source: Ambulatory Visit | Attending: Family Medicine | Admitting: Family Medicine

## 2021-02-12 DIAGNOSIS — Z1231 Encounter for screening mammogram for malignant neoplasm of breast: Secondary | ICD-10-CM

## 2021-03-02 ENCOUNTER — Other Ambulatory Visit (HOSPITAL_BASED_OUTPATIENT_CLINIC_OR_DEPARTMENT_OTHER): Payer: Self-pay

## 2021-03-31 ENCOUNTER — Telehealth: Payer: Self-pay | Admitting: Internal Medicine

## 2021-03-31 ENCOUNTER — Other Ambulatory Visit: Payer: Self-pay | Admitting: *Deleted

## 2021-03-31 ENCOUNTER — Other Ambulatory Visit (HOSPITAL_COMMUNITY): Payer: Self-pay

## 2021-03-31 MED ORDER — METHIMAZOLE 5 MG PO TABS
ORAL_TABLET | ORAL | 0 refills | Status: DC
  Filled 2021-03-31: qty 15, 30d supply, fill #0

## 2021-03-31 NOTE — Telephone Encounter (Signed)
MEDICATION: Methimazole  PHARMACY:  [facility]  HAS THE PATIENT CONTACTED THEIR PHARMACY?  Yes  IS THIS A 90 DAY SUPPLY : yes  IS PATIENT OUT OF MEDICATION: yes  IF NOT; HOW MUCH IS LEFT:   LAST APPOINTMENT DATE: @11 /19/2021  NEXT APPOINTMENT DATE:@5 /19/2022  DO WE HAVE YOUR PERMISSION TO LEAVE A DETAILED MESSAGE?:  OTHER COMMENTS:    **Let patient know to contact pharmacy at the end of the day to make sure medication is ready. **  ** Please notify patient to allow 48-72 hours to process**  **Encourage patient to contact the pharmacy for refills or they can request refills through [facility]**

## 2021-04-01 ENCOUNTER — Other Ambulatory Visit (HOSPITAL_COMMUNITY): Payer: Self-pay

## 2021-04-01 ENCOUNTER — Other Ambulatory Visit: Payer: Self-pay | Admitting: Internal Medicine

## 2021-04-01 MED ORDER — METHIMAZOLE 5 MG PO TABS
ORAL_TABLET | ORAL | 0 refills | Status: DC
  Filled 2021-04-01: qty 15, 30d supply, fill #0
  Filled 2021-04-01: qty 15, fill #0

## 2021-04-02 ENCOUNTER — Other Ambulatory Visit (HOSPITAL_COMMUNITY): Payer: Self-pay

## 2021-04-19 ENCOUNTER — Other Ambulatory Visit (HOSPITAL_COMMUNITY): Payer: Self-pay

## 2021-04-19 MED ORDER — LOSARTAN POTASSIUM 50 MG PO TABS
1.0000 | ORAL_TABLET | Freq: Every day | ORAL | 3 refills | Status: DC
  Filled 2021-04-19: qty 90, 90d supply, fill #0
  Filled 2021-07-13: qty 90, 90d supply, fill #1
  Filled 2021-10-17: qty 90, 90d supply, fill #2
  Filled 2022-01-14: qty 90, 90d supply, fill #3

## 2021-04-29 ENCOUNTER — Ambulatory Visit: Payer: 59 | Admitting: Internal Medicine

## 2021-04-29 ENCOUNTER — Other Ambulatory Visit: Payer: Self-pay

## 2021-04-29 ENCOUNTER — Encounter: Payer: Self-pay | Admitting: Internal Medicine

## 2021-04-29 VITALS — BP 110/68 | HR 73 | Ht 67.0 in | Wt 155.4 lb

## 2021-04-29 DIAGNOSIS — E0500 Thyrotoxicosis with diffuse goiter without thyrotoxic crisis or storm: Secondary | ICD-10-CM

## 2021-04-29 DIAGNOSIS — H538 Other visual disturbances: Secondary | ICD-10-CM

## 2021-04-29 NOTE — Progress Notes (Signed)
ThyroidPatient ID: Samantha Peck, female   DOB: 1966-06-22, 55 y.o.   MRN: [id number]   This visit occurred during the SARS-CoV-2 public health emergency.  Safety protocols were in place, including screening questions prior to the visit, additional usage of staff PPE, and extensive cleaning of exam room while observing appropriate contact time as indicated for disinfecting solutions.   HPI  Samantha Peck is a 55 y.o.-year-old female, presenting for follow-up for thyrotoxicosis, likely secondary to Graves' disease.  Last visit 1 year ago.  Interim history: She lost weight (~30 lbs) on Weight Watchers.   Reviewed history: She was dx with Graves ds. In 2010 (lost 15 lbs then)  >> started MMI >> but not compliant 2/2 lack of insurance >>  off the med completely since 11/2015.  We restarted MMI when she restarted to have insurance.  We changed to methimazole 10 mg in am and 5 mg in pm in 01/2017 >> TFTs improved afterwards.  In 10/2017, we increase the dose of methimazole to 7.5 mg in a.m. and 5 mg in p.m.  However, next TSH was elevated, so we decreased the dose to 5 mg twice a day in 12/2017, and then 5 mg once a day and 02/2018.    In 04/2018, we increased her methimazole dose to 7.5 mg daily.  TFTs normalized on this dose.  We had to increase the dose to 5 mg twice a day in 10/2018 due to a suppressed TSH.  Afterwards, we decreased the dose of methimazole in 03/2019 to 2.5 mg daily.  However, we had to increase the dose to 5 mg daily in 07/2019.  Subsequent TFTs were normal.  In 01/2020, we decreased the dose further to 2.5 mg daily.  Reviewed her TFTs: 02/08/2021: TSH 1.43, White blood cell count 7.3, normal Lab Results  Component Value Date   TSH 0.94 10/30/2020   TSH 1.72 04/29/2020   TSH 1.09 03/11/2020   TSH 2.72 01/29/2020   TSH 2.75 12/11/2019   TSH 3.50 10/31/2019   TSH 0.97 09/06/2019   TSH 0.14 (L) 07/26/2019   TSH 1.26 04/23/2019   TSH 4.02 03/13/2019    FREET4 0.90 10/30/2020   FREET4 0.81 04/29/2020   FREET4 0.80 03/11/2020   FREET4 0.79 01/29/2020   FREET4 0.69 12/11/2019   FREET4 0.78 10/31/2019   FREET4 0.83 09/06/2019   FREET4 1.03 07/26/2019   FREET4 0.72 04/23/2019   FREET4 0.68 03/13/2019    Lab Results  Component Value Date   T3FREE 4.0 10/30/2020   T3FREE 3.6 04/29/2020   T3FREE 4.0 03/11/2020   T3FREE 3.9 01/29/2020   T3FREE 3.9 12/11/2019   T3FREE 4.6 (H) 10/31/2019   T3FREE 3.9 09/06/2019   T3FREE 3.6 07/26/2019   T3FREE 3.6 04/23/2019   T3FREE 3.8 03/13/2019   Her TSI's continue to decrease: Lab Results  Component Value Date   TSI 132 04/29/2020   TSI 172 (H) 10/25/2018   TSI 424 (H) 10/23/2017   TSI 258 (H) 08/24/2016   Pt denies: - feeling nodules in neck - hoarseness - dysphagia - choking - SOB with lying down  Pt does not have FH of thyroid ds. No FH of thyroid cancer. No h/o radiation tx to head or neck.  No seaweed or kelp. No recent contrast studies. No herbal supplements. No Biotin use. No recent steroids use.   She has longstanding double vision-intermittently, when tired. Dr. Keane Police.   ROS: Constitutional: no weight gain/+ weight loss, no fatigue, no  subjective hyperthermia, no subjective hypothermia Eyes: + blurry vision, no xerophthalmia ENT: no sore throat, + see HPI Cardiovascular: no CP/no SOB/no palpitations/no leg swelling Respiratory: no cough/no SOB/no wheezing Gastrointestinal: no N/no V/no D/no C/no acid reflux Musculoskeletal: no muscle aches/no joint aches Skin: no rashes, no hair loss Neurological: no tremors/no numbness/no tingling/no dizziness  I reviewed pt's medications, allergies, PMH, social hx, family hx, and changes were documented in the history of present illness. Otherwise, unchanged from my initial visit note.  Past Medical History:  Diagnosis Date  . Cancer ([place])    skin  . Graves disease   . Hypertension   . Thyroid disease    Past Surgical  History:  Procedure Laterality Date  . BREAST BIOPSY Right 2009   fibroadenoma  . BUNIONECTOMY  1996   bilateral   Social History   Socioeconomic History  . Marital status: Married    Spouse name: Not on file  . Number of children: 3  . Years of education: 48  . Highest education level: Not on file  Occupational History  . Occupation: Buyer, retail: [employer]  Tobacco Use  . Smoking status: Former Smoker    Types: Cigarettes    Quit date: 12/13/2007    Years since quitting: 13.3  . Smokeless tobacco: Never Used  . Tobacco comment: 1-2 cigarettes once in a while  Vaping Use  . Vaping Use: Never used  Substance and Sexual Activity  . Alcohol use: Not Currently  . Drug use: No  . Sexual activity: Not Currently  Other Topics Concern  . Not on file  Social History Narrative   Regular exercise-no   Caffeine Use-yes   Social Determinants of Health   Financial Resource Strain: Not on file  Food Insecurity: Not on file  Transportation Needs: Not on file  Physical Activity: Not on file  Stress: Not on file  Social Connections: Not on file  Intimate Partner Violence: Not on file   No Known Allergies Family History  Problem Relation Age of Onset  . Cancer Mother        Breast Cancer  . Diabetes Mother   . Heart disease Mother   . Hypertension Mother   . Breast cancer Mother   . Cancer Maternal Grandmother        Breast Cancer  . Breast cancer Maternal Grandmother   . Hypertension Father    Current Outpatient Medications  Medication Sig Dispense Refill  . chlorthalidone (HYGROTON) 25 MG tablet   0  . chlorthalidone (HYGROTON) 25 MG tablet TAKE 1/2 TABLET BY MOUTH ONCE DAILY FOR FLUID/HYPERTENSION 45 tablet 3  . losartan (COZAAR) 50 MG tablet Take 50 mg by mouth daily.    Marland Kitchen losartan (COZAAR) 50 MG tablet TAKE 1 TABLET BY MOUTH ONCE DAILY 90 tablet 3  . losartan (COZAAR) 50 MG tablet Take 1 tablet by mouth daily. 90 tablet 3  . methimazole (TAPAZOLE) 5 MG  tablet Take 2.5 mg (1/2) tablet once a day. 30 day refill only until patient is seen in the office 15 tablet 0  . pantoprazole (PROTONIX) 40 MG tablet TAKE 1 TABLET BY MOUTH ONCE A DAY 90 tablet 0  . PREVIDENT 5000 ORTHO DEFENSE 1.1 % PSTE      No current facility-administered medications for this visit.   PE: BP 110/68 (BP Location: Right Arm, Patient Position: Sitting, Cuff Size: Normal)   Pulse 73   Ht 5\' 7"  (1.702 m)   Wt 155 lb 6.4  oz (70.5 kg)   SpO2 96%   BMI 24.34 kg/m  Wt Readings from Last 3 Encounters:  04/29/21 155 lb 6.4 oz (70.5 kg)  04/29/20 173 lb (78.5 kg)  01/07/20 181 lb 12.8 oz (82.5 kg)   Constitutional: normal weight, in NAD Eyes: PERRLA, EOMI, no exophthalmos ENT: moist mucous membranes, no thyromegaly, no cervical lymphadenopathy Cardiovascular: RRR, No MRG Respiratory: CTA B Gastrointestinal: abdomen soft, NT, ND, BS+ Musculoskeletal: no deformities, strength intact in all 4 Skin: moist, warm, no rashes Neurological: no tremor with outstretched hands, DTR normal in all 4  ASSESSMENT: 1.  Graves' disease  2.  Blurry vision  PLAN:  1. Patient with history of Graves' disease, with initial thyrotoxic symptoms, now resolved. -We initially started her on methimazole 10 mg in a.m. and 5 mg in p.m. and we were able to gradually decrease the dose, we did not decrease it lower than 2.5 mg daily in the past.  In 07/2019, we had to increase the dose back to 5 mg daily with subsequent reduction of the dose in 01/2020 to 2.5 mg daily. Subsequent TFTs were normal, including at last visit and also at last check, in 01/2021. -Her thyroid antibodies (TSI's) were still elevated but significantly decreased over the last years and they returned normal at last check in 04/2020, at 132 -She is very compliant with methimazole, not missing doses -I do not feel that she requires RAI treatment or thyroidectomy for now -At this visit, she does not have any thyrotoxic symptoms.   She lost a significant amount of weight since last visit, but this was intentional, doing weight watchers. -At today's visit we will recheck her TFTs  -I will see her back in a year but with labs in 1 year, but for labs in 6 months  2.  Blurry vision -At this visit, she complains of blurred vision and she is wondering whether this is related to her Graves' disease.  We discussed about Graves' ophthalmopathy: Components, timeline, but she does not have double vision, eye pain, exophthalmos.  Also, she describes gritty sensation in the eyes, so I suspect that this is most likely related to allergies. -At today's visit, we will check her TSI's to see if they increase since last visit -She did not have a recent appointment with her ophthalmologist and I advised her to schedule this.  Needs refills.  Component     Latest Ref Rng & Units 04/29/2021  Triiodothyronine,Free,Serum     2.3 - 4.2 pg/mL 3.1  T4,Free(Direct)     0.8 - 1.8 ng/dL 1.2  TSH     mIU/L 1.55  TSI     <140 % baseline <89  Thyroid tests are excellent.  We will continue the current low-dose methimazole.  Philemon Kingdom, MD PhD [facility] End[facility]

## 2021-04-29 NOTE — Patient Instructions (Signed)
Please continue methimazole 2.5 mg daily.  Please stop at the lab.  Please come back for a follow-up appointment in 1 year, but with labs at 6 months. 

## 2021-05-03 ENCOUNTER — Other Ambulatory Visit: Payer: Self-pay | Admitting: Internal Medicine

## 2021-05-03 ENCOUNTER — Other Ambulatory Visit: Payer: Self-pay

## 2021-05-03 DIAGNOSIS — E0500 Thyrotoxicosis with diffuse goiter without thyrotoxic crisis or storm: Secondary | ICD-10-CM

## 2021-05-04 ENCOUNTER — Other Ambulatory Visit: Payer: Self-pay

## 2021-05-05 ENCOUNTER — Other Ambulatory Visit (HOSPITAL_COMMUNITY): Payer: Self-pay

## 2021-05-05 ENCOUNTER — Other Ambulatory Visit: Payer: Self-pay

## 2021-05-05 LAB — TEST AUTHORIZATION

## 2021-05-05 LAB — T4, FREE: Free T4: 1.2 ng/dL (ref 0.8–1.8)

## 2021-05-05 LAB — T3, FREE: T3, Free: 3.1 pg/mL (ref 2.3–4.2)

## 2021-05-05 LAB — THYROID STIMULATING IMMUNOGLOBULIN: TSI: <89 % baseline (ref <140)

## 2021-05-05 LAB — TSH: TSH: 1.55 mIU/L

## 2021-05-05 MED ORDER — METHIMAZOLE 5 MG PO TABS
ORAL_TABLET | ORAL | 3 refills | Status: DC
  Filled 2021-05-05: qty 45, 90d supply, fill #0
  Filled 2021-07-13: qty 45, 90d supply, fill #1

## 2021-05-12 ENCOUNTER — Other Ambulatory Visit (HOSPITAL_COMMUNITY): Payer: Self-pay

## 2021-05-22 ENCOUNTER — Other Ambulatory Visit (HOSPITAL_COMMUNITY): Payer: Self-pay

## 2021-05-24 ENCOUNTER — Other Ambulatory Visit (HOSPITAL_COMMUNITY): Payer: Self-pay

## 2021-06-15 ENCOUNTER — Other Ambulatory Visit (HOSPITAL_COMMUNITY): Payer: Self-pay

## 2021-07-13 ENCOUNTER — Other Ambulatory Visit (HOSPITAL_COMMUNITY): Payer: Self-pay

## 2021-07-16 ENCOUNTER — Other Ambulatory Visit (HOSPITAL_COMMUNITY): Payer: Self-pay

## 2021-10-18 ENCOUNTER — Other Ambulatory Visit (HOSPITAL_COMMUNITY): Payer: Self-pay

## 2021-11-02 ENCOUNTER — Other Ambulatory Visit: Payer: Self-pay

## 2021-11-02 ENCOUNTER — Other Ambulatory Visit: Payer: Self-pay | Admitting: Internal Medicine

## 2021-11-02 ENCOUNTER — Other Ambulatory Visit (HOSPITAL_COMMUNITY): Payer: Self-pay

## 2021-11-02 ENCOUNTER — Other Ambulatory Visit (INDEPENDENT_AMBULATORY_CARE_PROVIDER_SITE_OTHER): Payer: 59

## 2021-11-02 ENCOUNTER — Telehealth: Payer: Self-pay | Admitting: Internal Medicine

## 2021-11-02 DIAGNOSIS — E0500 Thyrotoxicosis with diffuse goiter without thyrotoxic crisis or storm: Secondary | ICD-10-CM | POA: Diagnosis not present

## 2021-11-02 LAB — T3, FREE: T3, Free: 7.1 pg/mL — ABNORMAL HIGH (ref 2.3–4.2)

## 2021-11-02 LAB — TSH: TSH: 0 u[IU]/mL — ABNORMAL LOW (ref 0.35–5.50)

## 2021-11-02 LAB — T4, FREE: Free T4: 1.74 ng/dL — ABNORMAL HIGH (ref 0.60–1.60)

## 2021-11-02 MED ORDER — METHIMAZOLE 5 MG PO TABS
ORAL_TABLET | ORAL | 3 refills | Status: DC
  Filled 2021-11-02: qty 45, 90d supply, fill #0

## 2021-11-02 MED ORDER — METHIMAZOLE 5 MG PO TABS
5.0000 mg | ORAL_TABLET | Freq: Every day | ORAL | 3 refills | Status: DC
  Filled 2021-11-02: qty 90, 90d supply, fill #0

## 2021-11-02 NOTE — Telephone Encounter (Signed)
Methimazole has now been sent to [facility]

## 2021-11-02 NOTE — Telephone Encounter (Signed)
MEDICATION: methimazole (TAPAZOLE) 5 MG tablet  PHARMACY:   [place] Phone:  [phone]  Fax:  [phone]      HAS THE PATIENT CONTACTED THEIR PHARMACY?  No  IS THIS A 90 DAY SUPPLY : Yes  IS PATIENT OUT OF MEDICATION: Yes  IF NOT; HOW MUCH IS LEFT: 0  LAST APPOINTMENT DATE: @5 /19/2022  NEXT APPOINTMENT DATE:@5 /24/2023  DO WE HAVE YOUR PERMISSION TO LEAVE A DETAILED MESSAGE?: Yes  OTHER COMMENTS:    **Let patient know to contact pharmacy at the end of the day to make sure medication is ready. **  ** Please notify patient to allow 48-72 hours to process**  **Encourage patient to contact the pharmacy for refills or they can request refills through [facility]**

## 2021-12-07 ENCOUNTER — Other Ambulatory Visit (INDEPENDENT_AMBULATORY_CARE_PROVIDER_SITE_OTHER): Payer: 59

## 2021-12-07 ENCOUNTER — Other Ambulatory Visit: Payer: Self-pay | Admitting: Internal Medicine

## 2021-12-07 ENCOUNTER — Other Ambulatory Visit (HOSPITAL_COMMUNITY): Payer: Self-pay

## 2021-12-07 DIAGNOSIS — E0500 Thyrotoxicosis with diffuse goiter without thyrotoxic crisis or storm: Secondary | ICD-10-CM | POA: Diagnosis not present

## 2021-12-07 LAB — TSH: TSH: <0.01 u[IU]/mL — ABNORMAL LOW (ref 0.35–5.50)

## 2021-12-07 LAB — T3, FREE: T3, Free: 9.8 pg/mL — ABNORMAL HIGH (ref 2.3–4.2)

## 2021-12-07 LAB — T4, FREE: Free T4: 2.54 ng/dL — ABNORMAL HIGH (ref 0.60–1.60)

## 2021-12-07 MED ORDER — METHIMAZOLE 5 MG PO TABS
5.0000 mg | ORAL_TABLET | Freq: Two times a day (BID) | ORAL | 3 refills | Status: DC
Start: 2021-12-07 — End: 2022-02-16

## 2021-12-08 ENCOUNTER — Other Ambulatory Visit (HOSPITAL_COMMUNITY): Payer: Self-pay

## 2021-12-08 MED ORDER — METHIMAZOLE 5 MG PO TABS
5.0000 mg | ORAL_TABLET | Freq: Two times a day (BID) | ORAL | 2 refills | Status: DC
  Filled 2021-12-08 (×2): qty 60, 30d supply, fill #0
  Filled 2022-01-04: qty 60, 30d supply, fill #1
  Filled 2022-02-03: qty 60, 30d supply, fill #2

## 2021-12-30 ENCOUNTER — Other Ambulatory Visit: Payer: Self-pay | Admitting: Family Medicine

## 2021-12-30 DIAGNOSIS — Z1231 Encounter for screening mammogram for malignant neoplasm of breast: Secondary | ICD-10-CM

## 2022-01-04 ENCOUNTER — Other Ambulatory Visit (HOSPITAL_COMMUNITY): Payer: Self-pay

## 2022-01-06 ENCOUNTER — Other Ambulatory Visit: Payer: 59

## 2022-01-06 DIAGNOSIS — E0500 Thyrotoxicosis with diffuse goiter without thyrotoxic crisis or storm: Secondary | ICD-10-CM

## 2022-01-06 LAB — T3, FREE: T3, Free: 6.7 pg/mL — ABNORMAL HIGH (ref 2.3–4.2)

## 2022-01-06 LAB — TSH: TSH: <0.01 mIU/L — ABNORMAL LOW

## 2022-01-06 LAB — T4, FREE: Free T4: 1.7 ng/dL (ref 0.8–1.8)

## 2022-01-10 ENCOUNTER — Other Ambulatory Visit (HOSPITAL_COMMUNITY): Payer: Self-pay

## 2022-01-10 DIAGNOSIS — R300 Dysuria: Secondary | ICD-10-CM | POA: Diagnosis not present

## 2022-01-10 DIAGNOSIS — K760 Fatty (change of) liver, not elsewhere classified: Secondary | ICD-10-CM | POA: Diagnosis not present

## 2022-01-10 DIAGNOSIS — I10 Essential (primary) hypertension: Secondary | ICD-10-CM | POA: Diagnosis not present

## 2022-01-10 MED ORDER — SULFAMETHOXAZOLE-TRIMETHOPRIM 800-160 MG PO TABS
ORAL_TABLET | ORAL | 0 refills | Status: DC
  Filled 2022-01-10: qty 10, 5d supply, fill #0

## 2022-01-14 ENCOUNTER — Other Ambulatory Visit (HOSPITAL_COMMUNITY): Payer: Self-pay

## 2022-02-03 ENCOUNTER — Other Ambulatory Visit (HOSPITAL_COMMUNITY): Payer: Self-pay

## 2022-02-14 ENCOUNTER — Ambulatory Visit: Payer: 59

## 2022-02-15 NOTE — Progress Notes (Signed)
56 y.o. G3P3 Married Caucasian female here for annual exam.   ? ?She is noticing some progression of her rectocele. ?No difficulty having BMs.  ? ?Occasional pain in right lower quadrant.  ?Nothing associated with this.  ?No bleeding.  ?No dysuria or blood in the urine.  ?Has back issues.  ? ?Sometimes has urinary incontinence with coughing.  ?No leak for no reason at all.  ?Does have urinary frequency.  ? ?Dealing with hyperthyroidism.  ? ?Has fatty liver.  ?She is working on weight loss.  ? ?PCP:  Claris Gower, MD ? ?Patient's last menstrual period was 09/12/2019 (approximate).     ?  ?    ?Sexually active: No.  ?The current method of family planning is post menopausal status.    ?Exercising: No.   none ?Smoker:  Former ? ?Health Maintenance: ?Pap:  01-07-20 Neg:Neg HR HPV, 02-11-19 ASCUS ?History of abnormal Pap:  02-11-19 ASCUS. Hx of abnormal pap in her 30's but no colposcopy and no treatment. ?MMG:  02-12-21 Neg/BiRads1.  [facility] app[facility] next [facility] at [facility].  ?Colonoscopy:  2019;next 2024 ?BMD:   n/a  Result  n/a ?TDaP:  PCP ?Gardasil:   n/a ?HIV:no ?Hep C:no ?Screening Labs: PCP ? ? reports that she quit smoking about 14 years ago. Her smoking use included cigarettes. She has never used smokeless tobacco. She reports that she does not currently use alcohol. She reports that she does not use drugs. ? ?Past Medical History:  ?Diagnosis Date  ? Cancer [facility])   ? skin  ? Graves disease   ? Hypertension   ? Thyroid disease   ? ? ?Past Surgical History:  ?Procedure Laterality Date  ? BREAST BIOPSY Right 2009  ? fibroadenoma  ? BUNIONECTOMY  1996  ? bilateral  ? ? ?Current Outpatient Medications  ?Medication Sig Dispense Refill  ? losartan (COZAAR) 50 MG tablet Take 1 tablet by mouth daily. 90 tablet 3  ? methimazole (TAPAZOLE) 5 MG tablet Take 1 tablet by mouth 2 times daily. 60 tablet 2  ? ?No current facility-administered medications for this visit.  ? ? ?Family History  ?Problem Relation Age of Onset  ? Cancer Mother    ?     Breast Cancer  ? Diabetes Mother   ? Heart disease Mother   ? Hypertension Mother   ? Breast cancer Mother   ? Cancer Maternal Grandmother   ?     Breast Cancer  ? Breast cancer Maternal Grandmother   ? Hypertension Father   ? ? ?Review of Systems  ?All other systems reviewed and are negative. ? ?Exam:   ?BP (!) 142/78   Pulse 88   Ht 5' 6.5" (1.689 m)   Wt 156 lb (70.8 kg)   LMP 09/12/2019 (Approximate)   SpO2 97%   BMI 24.80 kg/m?     ?General appearance: alert, cooperative and appears stated age ?Head: normocephalic, without obvious abnormality, atraumatic ?Neck: no adenopathy, supple, symmetrical, trachea midline and thyroid normal to inspection and palpation ?Lungs: clear to auscultation bilaterally ?Breasts: normal appearance, no masses or tenderness, No nipple retraction or dimpling, No nipple discharge or bleeding, No axillary adenopathy ?Heart: regular rate and rhythm ?Abdomen: soft, non-tender; no masses, no organomegaly ?Extremities: extremities normal, atraumatic, no cyanosis or edema ?Skin: skin color, texture, turgor normal. No rashes or lesions ?Lymph nodes: cervical, supraclavicular, and axillary nodes normal. ?Neurologic: grossly normal ? ?Pelvic: External genitalia:  no lesions ?  No abnormal inguinal nodes palpated. ?             Urethra:  normal appearing urethra with no masses, tenderness or lesions ?             Bartholins and Skenes: normal    ?             Vagina: normal appearing vagina with normal color and discharge, no lesions.  Third degree rectocele.  Good uterine support and good anterior vaginal wall support.  ?             Cervix: no lesions ?             Pap taken: yes. ?Bimanual Exam:  Uterus:  normal size, contour, position, consistency, mobility, non-tender ?             Adnexa: no mass, fullness, tenderness ?             Rectal exam: yes..  Confirms. ?             Anus:  normal sphincter tone, no lesions ? ?Chaperone was present for exam:   yes. ? ?Assessment:   ?Well woman visit with gynecologic exam. ?Cervical cancer screening.  ?Hx ASCUS paps. ?FH breast cancer - mother age 63 and maternal grandmother.  ?Rectocele.   Has progressed.  ? ?Plan: ?Mammogram screening discussed. ?Self breast awareness reviewed. ?Pap and HR HPV collected. ?Guidelines for Calcium, Vitamin D, regular exercise program including cardiovascular and weight bearing exercise. ?Labs with PCP.  ?I discussed genetic counseling and testing.  She will consider this.  ?We reviewed her rectocele and treatment options of pelvic floor therapy, rectocele repair, and pessary use. She will consider her options.  ?Follow up annually and prn.  ? ?After visit summary provided.  ? ? ? ?

## 2022-02-16 ENCOUNTER — Other Ambulatory Visit (HOSPITAL_COMMUNITY)
Admission: RE | Admit: 2022-02-16 | Discharge: 2022-02-16 | Disposition: A | Discharge status: Home / Self Care | Payer: 59 | Source: Ambulatory Visit | Attending: Obstetrics and Gynecology | Admitting: Obstetrics and Gynecology

## 2022-02-16 ENCOUNTER — Encounter: Payer: Self-pay | Admitting: Obstetrics and Gynecology

## 2022-02-16 ENCOUNTER — Other Ambulatory Visit: Payer: Self-pay

## 2022-02-16 ENCOUNTER — Ambulatory Visit (INDEPENDENT_AMBULATORY_CARE_PROVIDER_SITE_OTHER): Payer: 59 | Admitting: Obstetrics and Gynecology

## 2022-02-16 VITALS — BP 142/78 | HR 88 | Ht 66.5 in | Wt 156.0 lb

## 2022-02-16 DIAGNOSIS — Z803 Family history of malignant neoplasm of breast: Secondary | ICD-10-CM

## 2022-02-16 DIAGNOSIS — Z01419 Encounter for gynecological examination (general) (routine) without abnormal findings: Secondary | ICD-10-CM | POA: Diagnosis not present

## 2022-02-16 DIAGNOSIS — Z124 Encounter for screening for malignant neoplasm of cervix: Secondary | ICD-10-CM | POA: Insufficient documentation

## 2022-02-16 DIAGNOSIS — N816 Rectocele: Secondary | ICD-10-CM

## 2022-02-16 NOTE — Patient Instructions (Signed)
EXERCISE AND DIET:  We recommended that you start or continue a regular exercise program for good health. Regular exercise means any activity that makes your heart beat faster and makes you sweat.  We recommend exercising at least 30 minutes per day at least 3 days a week, preferably 4 or 5.  We also recommend a diet low in fat and sugar.  Inactivity, poor dietary choices and obesity can cause diabetes, heart attack, stroke, and kidney damage, among others.   ? ?ALCOHOL AND SMOKING:  Women should limit their alcohol intake to no more than 7 drinks/beers/glasses of wine (combined, not each!) per week. Moderation of alcohol intake to this level decreases your risk of breast cancer and liver damage. And of course, no recreational drugs are part of a healthy lifestyle.  And absolutely no smoking or even second hand smoke. Most people know smoking can cause heart and lung diseases, but did you know it also contributes to weakening of your bones? Aging of your skin?  Yellowing of your teeth and nails? ? ?CALCIUM AND VITAMIN D:  Adequate intake of calcium and Vitamin D are recommended.  The recommendations for exact amounts of these supplements seem to change often, but generally speaking 600 mg of calcium (either carbonate or citrate) and 800 units of Vitamin D per day seems prudent. Certain women may benefit from higher intake of Vitamin D.  If you are among these women, your doctor will have told you during your visit.   ? ?PAP SMEARS:  Pap smears, to check for cervical cancer or precancers,  have traditionally been done yearly, although recent scientific advances have shown that most women can have pap smears less often.  However, every woman still should have a physical exam from her gynecologist every year. It will include a breast check, inspection of the vulva and vagina to check for abnormal growths or skin changes, a visual exam of the cervix, and then an exam to evaluate the size and shape of the uterus and  ovaries.  And after 55 years of age, a rectal exam is indicated to check for rectal cancers. We will also provide age appropriate advice regarding health maintenance, like when you should have certain vaccines, screening for sexually transmitted diseases, bone density testing, colonoscopy, mammograms, etc.  ? ?MAMMOGRAMS:  All women over 55 years old should have a yearly mammogram. Many facilities now offer a "3D" mammogram, which may cost around $50 extra out of pocket. If possible,  we recommend you accept the option to have the 3D mammogram performed.  It both reduces the number of women who will be called back for extra views which then turn out to be normal, and it is better than the routine mammogram at detecting truly abnormal areas.   ? ?COLONOSCOPY:  Colonoscopy to screen for colon cancer is recommended for all women at age 18.  We know, you hate the idea of the prep.  We agree, BUT, having colon cancer and not knowing it is worse!!  Colon cancer so often starts as a polyp that can be seen and removed at colonscopy, which can quite literally save your life!  And if your first colonoscopy is normal and you have no family history of colon cancer, most women don't have to have it again for 10 years.  Once every ten years, you can do something that may end up saving your life, right?  We will be happy to help you get it scheduled when you are ready.  Be sure to check your insurance coverage so you understand how much it will cost.  It may be covered as a preventative service at no cost, but you should check your particular policy.   ? ?About Rectocele ? ?Overview ? ?A rectocele is a type of hernia which causes different degrees of bulging of the rectal tissues into the vaginal wall.  You may even notice that it presses against the vaginal wall so much that some vaginal tissues droop outside of the opening of your vagina. ? ?Causes of Rectocele ? ?The most common cause is childbirth.  The muscles and ligaments  in the pelvis that hold up and support the female organs and vagina become stretched and weakened during labor and delivery.  The more babies you have, the more the support tissues are stretched and weakened.  Not everyone who has a baby will develop a rectocele.  Some women have stronger supporting tissue in the pelvis and may not have as much of a problem as others.  Women who have a Cesarean section usually do not get rectocele's unless they pushed a long time prior to the cesarean delivery. ? ?Other conditions that can cause a rectocele include chronic constipation, a chronic cough, a lot of heavy lifting, and obesity.  Older women may have this problem because the loss of female hormones causes the vaginal tissue to become weaker. ? ?Symptoms ? ?There may not be any symptoms.  If you do have symptoms, they may include: ?Pelvic pressure in the rectal area ?Protrusion of the lower part of the vagina through the opening of the vagina ?Constipation and trapping of the stool, making it difficult to have a bowel movement.  In severe cases, you may have to press on the lower part of your vagina to help push the stool out of you rectum.  This is called splinting to empty. ? ?Diagnosing Rectocele ? ?Your health care provider will ask about your symptoms and perform a pelvic exam.  S/he will ask you to bear down, pushing like you are having a bowel movement so as to see how far the lower part of the vagina protrudes into the vagina and possible outside of the vagina.  Your provider will also ask you to contract the muscles of your pelvis (like you are stopping the stream in the middle of urinating) to determine the strength of your pelvic muscles.  Your provider may also do a rectal exam. ? ?Treatment Options ? ?If you do not have any symptoms, no treatment may be necessary.  Other treatment options include: ?Pelvic floor exercises: Contracting the muscles in your genital area may help strengthen your muscles and support  the organs.  Be sure to get proper exercise instruction from you physical therapist. ?A pessary (removealbe pelvic support device) sometimes helps rectocele symptoms. ?Surgery: Surgical repair may be necessary. In some cases the uterus may need to be taken out ( a hysterectomy) as well.  There are many types of surgery for pelvic support problems.  Look for physicians who specialize in repair procedures. ? ?You can take care of yourself by: ?Treating and preventing constipation ?Avoiding heavy lifting, and lifting correctly (with your legs, not with you waist or back) ?Treating a chronic cough or bronchitis ?Not smoking ?avoiding too much weight gain ?Doing pelvic floor exercises ? ?? 2007, [employer] Doc.33 ?

## 2022-02-21 LAB — CYTOLOGY - PAP
Comment: NEGATIVE
Diagnosis: NEGATIVE
High risk HPV: NEGATIVE

## 2022-02-22 ENCOUNTER — Ambulatory Visit
Admission: RE | Admit: 2022-02-22 | Discharge: 2022-02-22 | Disposition: A | Discharge status: Home / Self Care | Payer: 59 | Source: Ambulatory Visit | Attending: Family Medicine | Admitting: Family Medicine

## 2022-02-22 ENCOUNTER — Ambulatory Visit: Payer: 59

## 2022-02-22 DIAGNOSIS — Z1231 Encounter for screening mammogram for malignant neoplasm of breast: Secondary | ICD-10-CM

## 2022-03-07 ENCOUNTER — Other Ambulatory Visit (INDEPENDENT_AMBULATORY_CARE_PROVIDER_SITE_OTHER): Payer: 59

## 2022-03-07 ENCOUNTER — Other Ambulatory Visit: Payer: 59

## 2022-03-07 DIAGNOSIS — E0500 Thyrotoxicosis with diffuse goiter without thyrotoxic crisis or storm: Secondary | ICD-10-CM

## 2022-03-08 ENCOUNTER — Other Ambulatory Visit: Payer: Self-pay | Admitting: Internal Medicine

## 2022-03-08 DIAGNOSIS — E0500 Thyrotoxicosis with diffuse goiter without thyrotoxic crisis or storm: Secondary | ICD-10-CM

## 2022-03-08 LAB — T3, FREE: T3, Free: 4.7 pg/mL — ABNORMAL HIGH (ref 2.3–4.2)

## 2022-03-08 LAB — TSH: TSH: <0.01 u[IU]/mL — ABNORMAL LOW (ref 0.35–5.50)

## 2022-03-08 LAB — T4, FREE: Free T4: 1.05 ng/dL (ref 0.60–1.60)

## 2022-03-09 ENCOUNTER — Other Ambulatory Visit (HOSPITAL_COMMUNITY): Payer: Self-pay

## 2022-03-09 ENCOUNTER — Other Ambulatory Visit: Payer: Self-pay | Admitting: Internal Medicine

## 2022-03-09 DIAGNOSIS — E0500 Thyrotoxicosis with diffuse goiter without thyrotoxic crisis or storm: Secondary | ICD-10-CM

## 2022-03-10 ENCOUNTER — Other Ambulatory Visit (HOSPITAL_COMMUNITY): Payer: Self-pay

## 2022-03-10 MED ORDER — METHIMAZOLE 5 MG PO TABS
5.0000 mg | ORAL_TABLET | Freq: Two times a day (BID) | ORAL | 0 refills | Status: DC
  Filled 2022-03-10: qty 120, 60d supply, fill #0

## 2022-04-11 ENCOUNTER — Other Ambulatory Visit (HOSPITAL_COMMUNITY): Payer: Self-pay

## 2022-04-12 ENCOUNTER — Other Ambulatory Visit (HOSPITAL_COMMUNITY): Payer: Self-pay

## 2022-04-12 MED ORDER — LOSARTAN POTASSIUM 50 MG PO TABS
50.0000 mg | ORAL_TABLET | Freq: Every day | ORAL | 2 refills | Status: DC
  Filled 2022-04-12: qty 90, 90d supply, fill #0
  Filled 2022-07-19: qty 90, 90d supply, fill #1
  Filled 2022-10-12: qty 90, 90d supply, fill #2

## 2022-05-04 ENCOUNTER — Ambulatory Visit: Payer: 59 | Admitting: Internal Medicine

## 2022-05-04 ENCOUNTER — Encounter: Payer: Self-pay | Admitting: Internal Medicine

## 2022-05-04 ENCOUNTER — Other Ambulatory Visit (HOSPITAL_COMMUNITY): Payer: Self-pay

## 2022-05-04 VITALS — BP 128/88 | HR 92 | Ht 66.5 in | Wt 169.0 lb

## 2022-05-04 DIAGNOSIS — E0500 Thyrotoxicosis with diffuse goiter without thyrotoxic crisis or storm: Secondary | ICD-10-CM

## 2022-05-04 LAB — TSH: TSH: 0.01 u[IU]/mL — ABNORMAL LOW (ref 0.35–5.50)

## 2022-05-04 LAB — T3, FREE: T3, Free: 4 pg/mL (ref 2.3–4.2)

## 2022-05-04 LAB — T4, FREE: Free T4: 0.8 ng/dL (ref 0.60–1.60)

## 2022-05-04 MED ORDER — METHIMAZOLE 5 MG PO TABS
5.0000 mg | ORAL_TABLET | Freq: Two times a day (BID) | ORAL | 5 refills | Status: DC
  Filled 2022-05-04: qty 120, 60d supply, fill #0
  Filled 2022-07-19: qty 120, 60d supply, fill #1
  Filled 2022-09-26: qty 120, 60d supply, fill #2
  Filled 2022-11-28: qty 120, 60d supply, fill #3
  Filled 2023-02-07: qty 120, 60d supply, fill #4

## 2022-05-04 NOTE — Progress Notes (Signed)
ThyroidPatient ID: Samantha Peck, female   DOB: 1966-11-09, 56 y.o.   MRN: [id number]   This visit occurred during the SARS-CoV-2 public health emergency.  Safety protocols were in place, including screening questions prior to the visit, additional usage of staff PPE, and extensive cleaning of exam room while observing appropriate contact time as indicated for disinfecting solutions.   HPI  Samantha Peck is a 56 y.o.-year-old female, presenting for follow-up for thyrotoxicosis, likely secondary to Graves' disease.  Last visit 1 year ago.  Interim history: She lost weight (~30 lbs) on Weight Watchers before last visit, but after our last visit, she relaxed her diet and stopped exercising.  She gained 14 pounds. Since then, her TFTs became abnormal and we had to increase methimazole dose.  Reviewed history: She was dx with Graves ds. In 2010 (lost 15 lbs then)  >> started MMI >> but not compliant 2/2 lack of insurance >>  off the med completely since 11/2015.  We restarted MMI when she restarted to have insurance.  We changed to methimazole 10 mg in am and 5 mg in pm in 01/2017 >> TFTs improved afterwards.  In 10/2017, we increase the dose of methimazole to 7.5 mg in a.m. and 5 mg in p.m.  However, next TSH was elevated, so we decreased the dose to 5 mg twice a day in 12/2017, and then 5 mg once a day and 02/2018.    In 04/2018, we increased her methimazole dose to 7.5 mg daily.  TFTs normalized on this dose.  We had to increase the dose to 5 mg twice a day in 10/2018 due to a suppressed TSH.  Afterwards, we decreased the dose of methimazole in 03/2019 to 2.5 mg daily.  However, we had to increase the dose to 5 mg daily in 07/2019.  Subsequent TFTs were normal.  In 01/2020, we decreased the dose further to 2.5 mg daily.  In 10/2021, we increased the dose to 5 mg daily.  In 11/2021, we increased the dose to 5 mg twice a day.  Reviewed her TFTs: Lab Results  Component Value Date    TSH <0.01 (L) 03/07/2022   TSH <0.01 (L) 01/06/2022   TSH <0.01 (L) 12/07/2021   TSH 0.00 Repeated and verified X2. (L) 11/02/2021   TSH 1.55 04/29/2021   TSH 0.94 10/30/2020   TSH 1.72 04/29/2020   TSH 1.09 03/11/2020   TSH 2.72 01/29/2020   TSH 2.75 12/11/2019   FREET4 1.05 03/07/2022   FREET4 1.7 01/06/2022   FREET4 2.54 (H) 12/07/2021   FREET4 1.74 (H) 11/02/2021   FREET4 1.2 04/29/2021   FREET4 0.90 10/30/2020   FREET4 0.81 04/29/2020   FREET4 0.80 03/11/2020   FREET4 0.79 01/29/2020   FREET4 0.69 12/11/2019  02/08/2021: TSH 1.43, White blood cell count 7.3, normal  Lab Results  Component Value Date   T3FREE 4.7 (H) 03/07/2022   T3FREE 6.7 (H) 01/06/2022   T3FREE 9.8 (H) 12/07/2021   T3FREE 7.1 (H) 11/02/2021   T3FREE 3.1 04/29/2021   T3FREE 4.0 10/30/2020   T3FREE 3.6 04/29/2020   T3FREE 4.0 03/11/2020   T3FREE 3.9 01/29/2020   T3FREE 3.9 12/11/2019   Her TSI's continue to decrease: Lab Results  Component Value Date   TSI <89 04/29/2021   TSI 132 04/29/2020   TSI 172 (H) 10/25/2018   TSI 424 (H) 10/23/2017   TSI 258 (H) 08/24/2016   Pt denies: - feeling nodules in neck - hoarseness - dysphagia -  choking - SOB with lying down  Pt does not have FH of thyroid ds. No FH of thyroid cancer. No h/o radiation tx to head or neck. No herbal supplements. No Biotin use. No recent steroids use.   She has longstanding double vision-intermittently, when tired. Dr. Keane Police.   ROS: + hot flushes  I reviewed pt's medications, allergies, PMH, social hx, family hx, and changes were documented in the history of present illness. Otherwise, unchanged from my initial visit note.  Past Medical History:  Diagnosis Date   Cancer ([place])    skin   Graves disease    Hypertension    Thyroid disease    Past Surgical History:  Procedure Laterality Date   BREAST BIOPSY Right 2009   fibroadenoma   BUNIONECTOMY  1996   bilateral   Social History   Socioeconomic  History   Marital status: Married    Spouse name: Not on file   Number of children: 3   Years of education: 14   Highest education level: Not on file  Occupational History   Occupation: Buyer, retail: [employer]  Tobacco Use   Smoking status: Former    Types: Cigarettes    Quit date: 12/13/2007    Years since quitting: 14.4   Smokeless tobacco: Never   Tobacco comments:    1-2 cigarettes once in a while  Vaping Use   Vaping Use: Never used  Substance and Sexual Activity   Alcohol use: Not Currently   Drug use: No   Sexual activity: Not Currently  Other Topics Concern   Not on file  Social History Narrative   Regular exercise-no   Caffeine Use-yes   Social Determinants of Health   Financial Resource Strain: Not on file  Food Insecurity: Not on file  Transportation Needs: Not on file  Physical Activity: Not on file  Stress: Not on file  Social Connections: Not on file  Intimate Partner Violence: Not on file   No Known Allergies Family History  Problem Relation Age of Onset   Cancer Mother        Breast Cancer   Diabetes Mother    Heart disease Mother    Hypertension Mother    Breast cancer Mother 86   Hypertension Father    Cancer Maternal Grandmother        Breast Cancer   Breast cancer Maternal Grandmother    Current Outpatient Medications  Medication Sig Dispense Refill   losartan (COZAAR) 50 MG tablet Take 1 tablet by mouth daily. 90 tablet 2   methimazole (TAPAZOLE) 5 MG tablet Take 1 tablet by mouth 2 times daily. 120 tablet 0   No current facility-administered medications for this visit.   PE: BP 128/88 (BP Location: Right Arm, Patient Position: Sitting, Cuff Size: Normal)   Pulse 92   Ht 5' 6.5" (1.689 m)   Wt 169 lb (76.7 kg)   LMP 09/12/2019 (Approximate)   SpO2 99%   BMI 26.87 kg/m  Wt Readings from Last 3 Encounters:  05/04/22 169 lb (76.7 kg)  02/16/22 156 lb (70.8 kg)  04/29/21 155 lb 6.4 oz (70.5 kg)   Constitutional: normal  weight, in NAD Eyes: PERRLA, EOMI, no exophthalmos ENT: moist mucous membranes, no thyromegaly, no cervical lymphadenopathy Cardiovascular: RR, No MRG Respiratory: CTA B Musculoskeletal: no deformities, strength intact in all 4 Skin: moist, warm, no rashes Neurological: no tremor with outstretched hands, DTR normal in all 4  ASSESSMENT: 1.  Graves' disease  PLAN:  1. Patient with history of Graves' disease, with initial thyrotoxic symptoms, then resolved. -We initially started her on methimazole 10 mg in a.m. and 5 mg in p.m. and we were able to gradually decrease the dose, we did not decrease it lower than 2.5 mg daily in the past.  In 07/2019, we had to increase the dose back to 5 mg daily with subsequent reduction of the dose in 01/2020 to 2.5 mg daily. Her thyroid antibodies (TSI's) were still elevated but significantly decreased over the last years and they returned normal at last check in 04/2020, at 132. Subsequent TFTs were normal, however, since last visit, her TSH became undetectable again.   -At last visit she was on 2.5 mg of methimazole daily but since then, we increased the dose gradually to 5 mg twice a day (last dose increased 02/2022) -She is compliant with methimazole, not missing doses -At today's visit, we discussed about continuing methimazole, but we also discussed about definitive treatment with RAI ablation.  We discussed about the advantages and disadvantages of RAI treatment versus thyroidectomy.  She would be more prone to get thyroidectomy, as she does not like the idea of RAI treatment.  She would like to think about it and discuss with her husband.  We did discuss that if we go ahead with RAI treatment, will need a thyroid uptake and scan first.  We also discussed about the details of thyroidectomy, including location, hospitalization time and the absolute need for levothyroxine afterwards.  We also discussed about hypothyroidism as a more manageable and more benign  condition as uncontrolled hyperthyroidism. -We will recheck her TFTs today and adjust the methimazole dose accordingly for now, until she decides about definitive treatment -I will see her back in a year but with labs in 6 months, but sooner for labs  Needs refills if we continue with methimazole.  - Total time spent for the visit: 25 min, in precharting, obtaining medical information from the patient and from the chart, reviewing her  previous labs, imaging evaluations, and treatments, reviewing her symptoms, counseling her about her condition (please see the discussed topics above), and developing a plan to further investigate and treat it; she had a number of questions which I addressed.   Component     Latest Ref Rng 05/04/2022  TSH     0.35 - 5.50 uIU/mL 0.01 (L)   T4,Free(Direct)     0.60 - 1.60 ng/dL 0.80   Triiodothyronine,Free,Serum     2.3 - 4.2 pg/mL 4.0     TSH is now detectable, while the rest of the thyroid hormones are at goal.  For now I would suggest to continue the same dose of methimazole and recheck the tests in 1.5 months.  Philemon Kingdom, MD PhD [facility] Endocrinology

## 2022-05-04 NOTE — Patient Instructions (Signed)
Please continue methimazole 5 mg 2x a day.  Please stop at the lab.  Please come back for a follow-up appointment in 6 months. 

## 2022-05-05 ENCOUNTER — Other Ambulatory Visit (HOSPITAL_COMMUNITY): Payer: Self-pay

## 2022-07-19 ENCOUNTER — Other Ambulatory Visit (HOSPITAL_COMMUNITY): Payer: Self-pay

## 2022-07-21 ENCOUNTER — Other Ambulatory Visit (HOSPITAL_COMMUNITY): Payer: Self-pay

## 2022-08-17 ENCOUNTER — Other Ambulatory Visit (HOSPITAL_COMMUNITY): Payer: Self-pay

## 2022-08-17 MED ORDER — CHLORTHALIDONE 25 MG PO TABS
12.5000 mg | ORAL_TABLET | Freq: Every day | ORAL | 3 refills | Status: DC
  Filled 2022-08-17: qty 45, 90d supply, fill #0
  Filled 2022-11-07: qty 45, 90d supply, fill #1
  Filled 2023-02-07: qty 45, 90d supply, fill #2
  Filled 2023-04-25 – 2023-04-26 (×2): qty 45, 90d supply, fill #3

## 2022-08-18 ENCOUNTER — Other Ambulatory Visit (HOSPITAL_COMMUNITY): Payer: Self-pay

## 2022-09-19 ENCOUNTER — Ambulatory Visit (INDEPENDENT_AMBULATORY_CARE_PROVIDER_SITE_OTHER): Payer: 59

## 2022-09-19 ENCOUNTER — Other Ambulatory Visit (HOSPITAL_COMMUNITY): Payer: Self-pay

## 2022-09-19 ENCOUNTER — Ambulatory Visit (INDEPENDENT_AMBULATORY_CARE_PROVIDER_SITE_OTHER): Payer: 59 | Admitting: Physician Assistant

## 2022-09-19 DIAGNOSIS — M79671 Pain in right foot: Secondary | ICD-10-CM

## 2022-09-19 DIAGNOSIS — M25562 Pain in left knee: Secondary | ICD-10-CM

## 2022-09-19 DIAGNOSIS — S93401A Sprain of unspecified ligament of right ankle, initial encounter: Secondary | ICD-10-CM | POA: Diagnosis not present

## 2022-09-19 DIAGNOSIS — S8002XA Contusion of left knee, initial encounter: Secondary | ICD-10-CM | POA: Diagnosis not present

## 2022-09-19 MED ORDER — MELOXICAM 15 MG PO TABS
15.0000 mg | ORAL_TABLET | Freq: Every day | ORAL | 0 refills | Status: DC
  Filled 2022-09-19: qty 30, 30d supply, fill #0

## 2022-09-19 NOTE — Progress Notes (Signed)
Office Visit Note   Patient: Samantha Peck           Date of Birth: 06/28/1966           MRN: [id number] Visit Date: 09/19/2022              Requested by: Leonard Downing, MD [address] [place],  [place] [address] PCP: Leonard Downing, MD  Chief Complaint  Patient presents with   Left Knee - Pain   Right Foot - Pain      HPI: Patient is a pleasant 56 year old woman with a chief complaint of left knee pain and right foot and ankle pain.  This began over the weekend when she was getting into a truck and fell.  She thought there was a running board present and there was not.  She thinks she may have turned her ankle and landed directly on her knee.  Her knee hurts more today than her foot or ankle that the swelling is worse in the right foot and ankle.  She has not really done any specific treatment to this.  She is concerned because she has a trip planned in a couple weeks and she will have to do quite a bit of walking.  She feels right now it is difficult for her to walk long distances.  Assessment & Plan: Visit Diagnoses:  1. Left knee pain, unspecified chronicity   2. Pain in right foot   3. Sprain of unspecified ligament of right ankle, initial encounter   4. Contusion of left knee, initial encounter     Plan: X-rays of her knee foot and ankle reassuring.  On exam her left knee does not have any effusion and she is distally neurovascularly intact.  She does have some pain and an ecchymotic area over the tibial plateau but it is not particularly tender.  More than likely this is a contusion.  I told her to get an over the knee support.  With regards to her right ankle I do think she sustained an ankle sprain discussed the importance of being compliant with wearing a brace so she does not be sprained this.  We will give her an ankle ASO brace.  She does have some traveling up and she is concerned about walking long distances.  I did give her a 1 month handicap  placard at her request.  This will not be renewed.  Also called in some meloxicam.  Gave her information on topical Voltaren gel.  Encouraged ice elevation as needed she was given a note to work from home for this week  Follow-Up Instructions: As needed  Ortho Exam  Patient is alert, oriented, no adenopathy, well-dressed, normal affect, normal respiratory effort. Examination of her left knee she has no redness no effusion she does have a small ecchymotic spot over the proximal tibial plateau she has minimal tenderness.  She has good stability with varus and valgus testing good endpoint on anterior draw.  She is able to sustain a straight leg raise.  No pain with range of motion of her knee. Right foot she does have moderate soft tissue swelling that is focused to the lateral side of the foot and ankle.  Pulses are intact she has brisk capillary refill.  Well-healed previous surgical scars.  She has good dorsiflexion plantarflexion eversion inversion.  She is not very tender over the distal fibula but is tender over the ATFL.  No tenderness over the medial joint line no  tenderness proximally compartments are soft nontender  Imaging: XR KNEE 3 VIEW LEFT  Result Date: 09/19/2022 Radiographs of her left knee were reviewed in 3 projections well-maintained alignment.  Well-preserved joint spacing overall no evidence of any fracture of the tibia patella or femur or fibula  XR Foot Complete Right  Result Date: 09/19/2022 Radiographs of her right foot were reviewed in 3 projections today.  Well-maintained alignment through the Lisfranc joint.  She does have hardware present in the first TMT joint and the fifth metatarsal hardware is intact and in place.  No evidence of any acute fractures  XR Ankle Complete Right  Result Date: 09/19/2022 Three-view radiographs of her right ankle demonstrate well-maintained alignment no widening of the mortise.  No evidence of fracture she does have some lateral soft  tissue swelling  No images are attached to the encounter.  Labs: No results found for: "HGBA1C", "ESRSEDRATE", "CRP", "LABURIC", "REPTSTATUS", "GRAMSTAIN", "CULT", "LABORGA"   No results found for: "ALBUMIN", "PREALBUMIN", "CBC"  No results found for: "MG" No results found for: "VD25OH"  No results found for: "PREALBUMIN"     No data to display           There is no height or weight on file to calculate BMI.  Orders:  Orders Placed This Encounter  Procedures   XR Ankle Complete Right   XR KNEE 3 VIEW LEFT   XR Foot Complete Right   Meds ordered this encounter  Medications   meloxicam (MOBIC) 15 MG tablet    Sig: Take 1 tablet (15 mg total) by mouth daily.    Dispense:  30 tablet    Refill:  0     Procedures: No procedures performed  Clinical Data: No additional findings.  ROS:  All other systems negative, except as noted in the HPI. Review of Systems  Objective: Vital Signs: LMP 09/12/2019 (Approximate)   Specialty Comments:  No specialty comments available.  PMFS History: Patient Active Problem List   Diagnosis Date Noted   Sprain of unspecified ligament of right ankle, initial encounter 09/19/2022   Contusion of left knee 09/19/2022   Weight gain 04/23/2018   Graves disease 07/13/2012   Past Medical History:  Diagnosis Date   Cancer ([place])    skin   Graves disease    Hypertension    Thyroid disease     Family History  Problem Relation Age of Onset   Cancer Mother        Breast Cancer   Diabetes Mother    Heart disease Mother    Hypertension Mother    Breast cancer Mother 28   Hypertension Father    Cancer Maternal Grandmother        Breast Cancer   Breast cancer Maternal Grandmother     Past Surgical History:  Procedure Laterality Date   BREAST BIOPSY Right 2009   fibroadenoma   BUNIONECTOMY  1996   bilateral   Social History   Occupational History   Occupation: Buyer, retail: [employer]  Tobacco Use   Smoking  status: Former    Types: Cigarettes    Quit date: 12/13/2007    Years since quitting: 14.7   Smokeless tobacco: Never   Tobacco comments:    1-2 cigarettes once in a while  Vaping Use   Vaping Use: Never used  Substance and Sexual Activity   Alcohol use: Not Currently   Drug use: No   Sexual activity: Not Currently

## 2022-09-26 ENCOUNTER — Other Ambulatory Visit (HOSPITAL_COMMUNITY): Payer: Self-pay

## 2022-09-27 ENCOUNTER — Other Ambulatory Visit (HOSPITAL_COMMUNITY): Payer: Self-pay

## 2022-10-12 ENCOUNTER — Other Ambulatory Visit (HOSPITAL_COMMUNITY): Payer: Self-pay

## 2022-11-07 ENCOUNTER — Other Ambulatory Visit (HOSPITAL_COMMUNITY): Payer: Self-pay

## 2022-11-08 ENCOUNTER — Encounter: Payer: Self-pay | Admitting: Internal Medicine

## 2022-11-08 ENCOUNTER — Ambulatory Visit: Payer: 59 | Admitting: Internal Medicine

## 2022-11-08 VITALS — BP 120/74 | HR 109 | Ht 66.0 in | Wt 178.4 lb

## 2022-11-08 DIAGNOSIS — E0500 Thyrotoxicosis with diffuse goiter without thyrotoxic crisis or storm: Secondary | ICD-10-CM

## 2022-11-08 LAB — T3, FREE: T3, Free: 5.2 pg/mL — ABNORMAL HIGH (ref 2.3–4.2)

## 2022-11-08 LAB — TSH: TSH: 3.29 u[IU]/mL (ref 0.35–5.50)

## 2022-11-08 LAB — T4, FREE: Free T4: 0.74 ng/dL (ref 0.60–1.60)

## 2022-11-08 NOTE — Progress Notes (Signed)
Patient ID: Samantha Peck, female   DOB: 02-05-1966, 56 y.o.   MRN: [id number]   HPI  Samantha Peck is a 56 y.o.-year-old female, presenting for follow-up for thyrotoxicosis, likely secondary to Graves' disease.  Last visit 6 mo ago.  Interim history: She initially lost approximately 30 pounds on weight watchers, but before last visit she gained 14 pounds back after she relaxed her diet. She gained 9 more lbs since last OV. Also, before last visit her TFTs became abnormal again so we had to increase methimazole. At today's visit, she does not have tremors, palpitations, unintentional weight loss, anxiety. She has hot flushes - chronic.  Reviewed history: She was dx with Graves ds. In 2010 (lost 15 lbs then)  >> started MMI >> but not compliant 2/2 lack of insurance >>  off the med completely since 11/2015.  We restarted MMI when she restarted to have insurance.  We changed to methimazole 10 mg in am and 5 mg in pm in 01/2017 >> TFTs improved afterwards.  In 10/2017, we increase the dose of methimazole to 7.5 mg in a.m. and 5 mg in p.m.  However, next TSH was elevated, so we decreased the dose to 5 mg twice a day in 12/2017, and then 5 mg once a day and 02/2018.    In 04/2018, we increased her methimazole dose to 7.5 mg daily.  TFTs normalized on this dose.  We had to increase the dose to 5 mg twice a day in 10/2018 due to a suppressed TSH.  Afterwards, we decreased the dose of methimazole in 03/2019 to 2.5 mg daily.  However, we had to increase the dose to 5 mg daily in 07/2019.  Subsequent TFTs were normal.  In 01/2020, we decreased the dose further to 2.5 mg daily.  In 10/2021, we increased the dose to 5 mg daily.  In 11/2021, we increased the dose to 5 mg twice a day.  Reviewed her TFTs: Lab Results  Component Value Date   TSH 0.01 (L) 05/04/2022   TSH <0.01 (L) 03/07/2022   TSH <0.01 (L) 01/06/2022   TSH <0.01 (L) 12/07/2021   TSH 0.00 Repeated and verified X2. (L)  11/02/2021   TSH 1.55 04/29/2021   TSH 0.94 10/30/2020   TSH 1.72 04/29/2020   TSH 1.09 03/11/2020   TSH 2.72 01/29/2020   FREET4 0.80 05/04/2022   FREET4 1.05 03/07/2022   FREET4 1.7 01/06/2022   FREET4 2.54 (H) 12/07/2021   FREET4 1.74 (H) 11/02/2021   FREET4 1.2 04/29/2021   FREET4 0.90 10/30/2020   FREET4 0.81 04/29/2020   FREET4 0.80 03/11/2020   FREET4 0.79 01/29/2020  02/08/2021: TSH 1.43, White blood cell count 7.3, normal  Lab Results  Component Value Date   T3FREE 4.0 05/04/2022   T3FREE 4.7 (H) 03/07/2022   T3FREE 6.7 (H) 01/06/2022   T3FREE 9.8 (H) 12/07/2021   T3FREE 7.1 (H) 11/02/2021   T3FREE 3.1 04/29/2021   T3FREE 4.0 10/30/2020   T3FREE 3.6 04/29/2020   T3FREE 4.0 03/11/2020   T3FREE 3.9 01/29/2020   Her TSI's continue to decrease: Lab Results  Component Value Date   TSI <89 04/29/2021   TSI 132 04/29/2020   TSI 172 (H) 10/25/2018   TSI 424 (H) 10/23/2017   TSI 258 (H) 08/24/2016   Pt denies: - feeling nodules in neck - hoarseness - dysphagia - choking  Pt does not have FH of thyroid ds. No FH of thyroid cancer. No h/o radiation tx  to head or neck. No herbal supplements. No Biotin use. No recent steroids use.   She has longstanding double vision-intermittently, when tired. Dr. Keane Police.   ROS: + hot flushes  I reviewed pt's medications, allergies, PMH, social hx, family hx, and changes were documented in the history of present illness. Otherwise, unchanged from my initial visit note.  Past Medical History:  Diagnosis Date   Cancer ([place])    skin   Graves disease    Hypertension    Thyroid disease    Past Surgical History:  Procedure Laterality Date   BREAST BIOPSY Right 2009   fibroadenoma   BUNIONECTOMY  1996   bilateral   Social History   Socioeconomic History   Marital status: Married    Spouse name: Not on file   Number of children: 3   Years of education: 14   Highest education level: Not on file  Occupational  History   Occupation: Buyer, retail: [employer]  Tobacco Use   Smoking status: Former    Types: Cigarettes    Quit date: 12/13/2007    Years since quitting: 14.9   Smokeless tobacco: Never   Tobacco comments:    1-2 cigarettes once in a while  Vaping Use   Vaping Use: Never used  Substance and Sexual Activity   Alcohol use: Not Currently   Drug use: No   Sexual activity: Not Currently  Other Topics Concern   Not on file  Social History Narrative   Regular exercise-no   Caffeine Use-yes   Social Determinants of Health   Financial Resource Strain: Not on file  Food Insecurity: Not on file  Transportation Needs: Not on file  Physical Activity: Not on file  Stress: Not on file  Social Connections: Not on file  Intimate Partner Violence: Not on file   No Known Allergies Family History  Problem Relation Age of Onset   Cancer Mother        Breast Cancer   Diabetes Mother    Heart disease Mother    Hypertension Mother    Breast cancer Mother 78   Hypertension Father    Cancer Maternal Grandmother        Breast Cancer   Breast cancer Maternal Grandmother    Current Outpatient Medications  Medication Sig Dispense Refill   chlorthalidone (HYGROTON) 25 MG tablet Take 1/2 tablet by mouth daily for fluid / hypertension 45 tablet 3   losartan (COZAAR) 50 MG tablet Take 1 tablet by mouth daily. 90 tablet 2   meloxicam (MOBIC) 15 MG tablet Take 1 tablet (15 mg total) by mouth daily. 30 tablet 0   methimazole (TAPAZOLE) 5 MG tablet Take 1 tablet by mouth 2 times daily. 120 tablet 5   No current facility-administered medications for this visit.   PE: BP 120/74 (BP Location: Right Arm, Patient Position: Sitting, Cuff Size: Normal)   Pulse (!) 109   Ht '5\' 6"'$  (1.676 m)   Wt 178 lb 6.4 oz (80.9 kg)   LMP 09/12/2019 (Approximate)   SpO2 99%   BMI 28.79 kg/m  Wt Readings from Last 3 Encounters:  11/08/22 178 lb 6.4 oz (80.9 kg)  05/04/22 169 lb (76.7 kg)  02/16/22 156 lb  (70.8 kg)   Constitutional: normal weight, in NAD Eyes:  no exophthalmos ENT: no neck masses, no cervical lymphadenopathy Cardiovascular: RRR, No MRG Respiratory: CTA B Musculoskeletal: no deformities Skin:no rashes Neurological: no tremor with outstretched hands  ASSESSMENT: 1.  Graves'  disease  PLAN:  1. Patient with recurrent Graves' disease, methimazole -We initially started her on 10 mg in a.m. and 5 mg p.m. and we were able to gradually decrease the dose down to 2.5 mg daily.  However, in 07/2019, we had to increase the dose back to 5 mg daily, with subsequent reduction in the dose back to 2.5 mg daily in 01/2020.  Before last visit, TFTs became abnormal again so we had to increase the dose to 5 mg twice a day in 02/2022.  In 04/2022, we did not change the regimen as her TFTs appears to be improved. -Reviewing her TSIs, they were initially elevated, but they were normal at last check in 04/2020, at 132, and undetectable at last check in 04/2021 -At last visit, we discussed about definitive treatment of Graves' disease: At that visit advantages of RAI treatment versus thyroidectomy.  She was more problem to get thyroidectomy if absolutely needed.  We discussed about details of thyroidectomy, including location, hospitalization time, and the absolute need for levothyroxine afterwards.  Also, we discussed that hypothyroidism is a more manageable and more benign condition compared to uncontrolled hyperthyroidism. -However, at last visit, TFTs were normal so we continued to same dose of methimazole while she was contemplating the above options. -At today's visit, she is asymptomatic and continues on methimazole 5 mg twice a day -We will recheck her TFTs today and change the dose accordingly -She will let me know if she decides about definitive treatment before next OV --I will see her back in 6 months  Component     Latest Ref Rng 11/08/2022  TSH     0.35 - 5.50 uIU/mL 3.29    T4,Free(Direct)     0.60 - 1.60 ng/dL 0.74   Triiodothyronine,Free,Serum     2.3 - 4.2 pg/mL 5.2 (H)   Thyroid tests are normal, with a slightly high free T3.  I will suggest to continue the same dose for now but recheck in 4-5 weeks.  Philemon Kingdom, MD PhD [facility] Endocrinology

## 2022-11-08 NOTE — Patient Instructions (Signed)
Please continue methimazole 5 mg 2x a day.  Please stop at the lab.  Please come back for a follow-up appointment in 6 months.

## 2022-11-15 ENCOUNTER — Telehealth: Payer: Self-pay | Admitting: Gastroenterology

## 2022-11-15 NOTE — Telephone Encounter (Signed)
Hi Dr. Tarri Glenn,  This patient called requesting a transfer of care over to you specifically. She said her husband is one our patients and she would like to continue her care with here as well. She has GI history with [facility] and her records were obtained for you to review and advise on scheduling.   Thanks

## 2022-11-28 ENCOUNTER — Other Ambulatory Visit (HOSPITAL_COMMUNITY): Payer: Self-pay

## 2022-12-09 ENCOUNTER — Other Ambulatory Visit: Payer: 59

## 2022-12-09 ENCOUNTER — Other Ambulatory Visit (INDEPENDENT_AMBULATORY_CARE_PROVIDER_SITE_OTHER): Payer: 59

## 2022-12-09 DIAGNOSIS — E0500 Thyrotoxicosis with diffuse goiter without thyrotoxic crisis or storm: Secondary | ICD-10-CM

## 2022-12-09 LAB — T3, FREE: T3, Free: 4.4 pg/mL — ABNORMAL HIGH (ref 2.3–4.2)

## 2022-12-09 LAB — TSH: TSH: 3.33 u[IU]/mL (ref 0.35–5.50)

## 2022-12-09 LAB — T4, FREE: Free T4: 0.53 ng/dL — ABNORMAL LOW (ref 0.60–1.60)

## 2023-01-09 ENCOUNTER — Other Ambulatory Visit: Payer: Self-pay | Admitting: Family Medicine

## 2023-01-09 DIAGNOSIS — Z1231 Encounter for screening mammogram for malignant neoplasm of breast: Secondary | ICD-10-CM

## 2023-01-11 ENCOUNTER — Other Ambulatory Visit (HOSPITAL_COMMUNITY): Payer: Self-pay

## 2023-01-12 ENCOUNTER — Other Ambulatory Visit: Payer: Self-pay

## 2023-01-12 ENCOUNTER — Other Ambulatory Visit (HOSPITAL_COMMUNITY): Payer: Self-pay

## 2023-01-12 MED ORDER — LOSARTAN POTASSIUM 50 MG PO TABS
50.0000 mg | ORAL_TABLET | Freq: Every day | ORAL | 0 refills | Status: DC
  Filled 2023-01-12: qty 10, 10d supply, fill #0

## 2023-01-12 MED ORDER — LOSARTAN POTASSIUM 50 MG PO TABS
50.0000 mg | ORAL_TABLET | Freq: Every day | ORAL | 0 refills | Status: DC
  Filled 2023-01-12 – 2023-01-20 (×2): qty 90, 90d supply, fill #0

## 2023-01-17 ENCOUNTER — Other Ambulatory Visit (HOSPITAL_COMMUNITY): Payer: Self-pay

## 2023-01-20 ENCOUNTER — Other Ambulatory Visit (HOSPITAL_COMMUNITY): Payer: Self-pay

## 2023-01-26 DIAGNOSIS — I10 Essential (primary) hypertension: Secondary | ICD-10-CM | POA: Diagnosis not present

## 2023-01-26 DIAGNOSIS — Z13220 Encounter for screening for lipoid disorders: Secondary | ICD-10-CM | POA: Diagnosis not present

## 2023-02-07 ENCOUNTER — Other Ambulatory Visit (HOSPITAL_COMMUNITY): Payer: Self-pay

## 2023-02-09 ENCOUNTER — Other Ambulatory Visit (INDEPENDENT_AMBULATORY_CARE_PROVIDER_SITE_OTHER): Payer: Commercial Managed Care - PPO

## 2023-02-09 DIAGNOSIS — E0500 Thyrotoxicosis with diffuse goiter without thyrotoxic crisis or storm: Secondary | ICD-10-CM | POA: Diagnosis not present

## 2023-02-09 LAB — T3, FREE: T3, Free: 3.8 pg/mL (ref 2.3–4.2)

## 2023-02-09 LAB — TSH: TSH: 4.49 u[IU]/mL (ref 0.35–5.50)

## 2023-02-09 LAB — T4, FREE: Free T4: 0.47 ng/dL — ABNORMAL LOW (ref 0.60–1.60)

## 2023-02-09 MED ORDER — METHIMAZOLE 5 MG PO TABS: ORAL_TABLET | ORAL | 5 refills | Status: DC

## 2023-02-27 ENCOUNTER — Ambulatory Visit
Admission: RE | Admit: 2023-02-27 | Discharge: 2023-02-27 | Disposition: A | Discharge status: Home / Self Care | Payer: Commercial Managed Care - PPO | Source: Ambulatory Visit | Attending: Family Medicine | Admitting: Family Medicine

## 2023-02-27 ENCOUNTER — Encounter: Payer: Self-pay | Admitting: Gastroenterology

## 2023-02-27 DIAGNOSIS — Z1231 Encounter for screening mammogram for malignant neoplasm of breast: Secondary | ICD-10-CM

## 2023-03-23 ENCOUNTER — Other Ambulatory Visit (INDEPENDENT_AMBULATORY_CARE_PROVIDER_SITE_OTHER): Payer: Commercial Managed Care - PPO

## 2023-03-23 DIAGNOSIS — E0500 Thyrotoxicosis with diffuse goiter without thyrotoxic crisis or storm: Secondary | ICD-10-CM | POA: Diagnosis not present

## 2023-03-23 LAB — T4, FREE: Free T4: 0.69 ng/dL (ref 0.60–1.60)

## 2023-03-23 LAB — TSH: TSH: 2.16 u[IU]/mL (ref 0.35–5.50)

## 2023-03-23 LAB — T3, FREE: T3, Free: 4.1 pg/mL (ref 2.3–4.2)

## 2023-04-19 ENCOUNTER — Other Ambulatory Visit (HOSPITAL_COMMUNITY): Payer: Self-pay

## 2023-04-25 ENCOUNTER — Other Ambulatory Visit (HOSPITAL_COMMUNITY): Payer: Self-pay

## 2023-04-25 ENCOUNTER — Other Ambulatory Visit: Payer: Self-pay

## 2023-04-25 MED ORDER — LOSARTAN POTASSIUM 50 MG PO TABS
50.0000 mg | ORAL_TABLET | Freq: Every day | ORAL | 0 refills | Status: DC
  Filled 2023-04-25: qty 90, 90d supply, fill #0

## 2023-04-26 ENCOUNTER — Other Ambulatory Visit (HOSPITAL_COMMUNITY): Payer: Self-pay

## 2023-04-26 ENCOUNTER — Other Ambulatory Visit: Payer: Self-pay

## 2023-04-27 DIAGNOSIS — L820 Inflamed seborrheic keratosis: Secondary | ICD-10-CM | POA: Diagnosis not present

## 2023-04-27 DIAGNOSIS — Z85828 Personal history of other malignant neoplasm of skin: Secondary | ICD-10-CM | POA: Diagnosis not present

## 2023-05-03 ENCOUNTER — Other Ambulatory Visit (INDEPENDENT_AMBULATORY_CARE_PROVIDER_SITE_OTHER): Payer: Commercial Managed Care - PPO

## 2023-05-03 ENCOUNTER — Other Ambulatory Visit (HOSPITAL_COMMUNITY): Payer: Self-pay

## 2023-05-03 ENCOUNTER — Encounter: Payer: Self-pay | Admitting: Internal Medicine

## 2023-05-03 ENCOUNTER — Ambulatory Visit: Payer: Commercial Managed Care - PPO | Admitting: Internal Medicine

## 2023-05-03 VITALS — BP 120/88 | HR 84 | Ht 66.0 in | Wt 185.0 lb

## 2023-05-03 DIAGNOSIS — E0500 Thyrotoxicosis with diffuse goiter without thyrotoxic crisis or storm: Secondary | ICD-10-CM

## 2023-05-03 LAB — T3, FREE: T3, Free: 3.8 pg/mL (ref 2.3–4.2)

## 2023-05-03 LAB — T4, FREE: Free T4: 0.88 ng/dL (ref 0.60–1.60)

## 2023-05-03 LAB — TSH: TSH: 1.16 u[IU]/mL (ref 0.35–5.50)

## 2023-05-03 MED ORDER — METHIMAZOLE 5 MG PO TABS
7.5000 mg | ORAL_TABLET | Freq: Every day | ORAL | 3 refills | Status: DC
Start: 2023-05-03 — End: 2024-04-30
  Filled 2023-05-03 (×2): qty 135, 90d supply, fill #0
  Filled 2023-07-27: qty 135, 90d supply, fill #1
  Filled 2023-10-20: qty 135, 90d supply, fill #2
  Filled 2024-01-24: qty 135, 90d supply, fill #3

## 2023-05-03 NOTE — Progress Notes (Signed)
Patient ID: Samantha Peck, female   DOB: January 30, 1966, 57 y.o.   MRN: [id number]   HPI  Samantha Peck is a 57 y.o.-year-old female, presenting for follow-up for thyrotoxicosis, likely secondary to Graves' disease.  Last visit 6 mo ago.  Interim history: She initially lost approximately 30 pounds on weight watchers, but then she gained 23 pounds back -at last visit.  Then, she gained another 7 pounds.  She relaxed her diet-includes chips and candy bars.  She is planning to go back to the plant-based diet, which worked well for her in the past. She denies tremors, palpitations, anxiety. She has hot flushes - chronic, intermittent.  Reviewed history: She was dx with Graves ds. In 2010 (lost 15 lbs then)  >> started MMI >> but not compliant 2/2 lack of insurance >>  off the med completely since 11/2015.  We restarted MMI when she restarted to have insurance.  We changed to methimazole 10 mg in am and 5 mg in pm in 01/2017 >> TFTs improved afterwards.  In 10/2017, we increase the dose of methimazole to 7.5 mg in a.m. and 5 mg in p.m.  However, next TSH was elevated, so we decreased the dose to 5 mg twice a day in 12/2017, and then 5 mg once a day and 02/2018.    In 04/2018, we increased her methimazole dose to 7.5 mg daily.  TFTs normalized on this dose.  We had to increase the dose to 5 mg twice a day in 10/2018 due to a suppressed TSH.  Afterwards, we decreased the dose of methimazole in 03/2019 to 2.5 mg daily.  However, we had to increase the dose to 5 mg daily in 07/2019.  Subsequent TFTs were normal.  In 01/2020, we decreased the dose further to 2.5 mg daily.  In 10/2021, we increased the dose to 5 mg daily.  In 11/2021, we increased the dose to 5 mg twice a day.  In 01/2023, we decreased the dose to 7.5 mg daily.  Reviewed her TFTs: Lab Results  Component Value Date   TSH 2.16 03/23/2023   TSH 4.49 02/09/2023   TSH 3.33 12/09/2022   TSH 3.29 11/08/2022   TSH 0.01 (L)  05/04/2022   TSH <0.01 (L) 03/07/2022   TSH <0.01 (L) 01/06/2022   TSH <0.01 (L) 12/07/2021   TSH 0.00 Repeated and verified X2. (L) 11/02/2021   TSH 1.55 04/29/2021   FREET4 0.69 03/23/2023   FREET4 0.47 (L) 02/09/2023   FREET4 0.53 (L) 12/09/2022   FREET4 0.74 11/08/2022   FREET4 0.80 05/04/2022   FREET4 1.05 03/07/2022   FREET4 1.7 01/06/2022   FREET4 2.54 (H) 12/07/2021   FREET4 1.74 (H) 11/02/2021   FREET4 1.2 04/29/2021  02/08/2021: TSH 1.43, White blood cell count 7.3, normal  Lab Results  Component Value Date   T3FREE 4.1 03/23/2023   T3FREE 3.8 02/09/2023   T3FREE 4.4 (H) 12/09/2022   T3FREE 5.2 (H) 11/08/2022   T3FREE 4.0 05/04/2022   T3FREE 4.7 (H) 03/07/2022   T3FREE 6.7 (H) 01/06/2022   T3FREE 9.8 (H) 12/07/2021   T3FREE 7.1 (H) 11/02/2021   T3FREE 3.1 04/29/2021   Her TSI's continue to decrease: Lab Results  Component Value Date   TSI <89 04/29/2021   TSI 132 04/29/2020   TSI 172 (H) 10/25/2018   TSI 424 (H) 10/23/2017   TSI 258 (H) 08/24/2016   Pt denies: - feeling nodules in neck - hoarseness - dysphagia - choking  Pt  does not have FH of thyroid ds. No FH of thyroid cancer. No h/o radiation tx to head or neck. No herbal supplements. No Biotin use. No recent steroids use.   She has longstanding double vision-intermittently, when tired. Dr. Loni Dolly.   ROS: + hot flushes  I reviewed pt's medications, allergies, PMH, social hx, family hx, and changes were documented in the history of present illness. Otherwise, unchanged from my initial visit note.  Past Medical History:  Diagnosis Date   Cancer (HCC)    skin   Graves disease    Hypertension    Thyroid disease    Past Surgical History:  Procedure Laterality Date   BREAST BIOPSY Right 2009   fibroadenoma   BUNIONECTOMY  1996   bilateral   Social History   Socioeconomic History   Marital status: Married    Spouse name: Not on file   Number of children: 3   Years of education: 14    Highest education level: Not on file  Occupational History   Occupation: Event organiser: [employer]  Tobacco Use   Smoking status: Former    Types: Cigarettes    Quit date: 12/13/2007    Years since quitting: 15.3   Smokeless tobacco: Never   Tobacco comments:    1-2 cigarettes once in a while  Vaping Use   Vaping Use: Never used  Substance and Sexual Activity   Alcohol use: Not Currently   Drug use: No   Sexual activity: Not Currently  Other Topics Concern   Not on file  Social History Narrative   Regular exercise-no   Caffeine Use-yes   Social Determinants of Health   Financial Resource Strain: Not on file  Food Insecurity: Not on file  Transportation Needs: Not on file  Physical Activity: Not on file  Stress: Not on file  Social Connections: Not on file  Intimate Partner Violence: Not on file   No Known Allergies Family History  Problem Relation Age of Onset   Cancer Mother        Breast Cancer   Diabetes Mother    Heart disease Mother    Hypertension Mother    Breast cancer Mother 53   Hypertension Father    Cancer Maternal Grandmother        Breast Cancer   Breast cancer Maternal Grandmother    Current Outpatient Medications  Medication Sig Dispense Refill   chlorthalidone (HYGROTON) 25 MG tablet Take 1/2 tablet by mouth daily for fluid / hypertension 45 tablet 3   losartan (COZAAR) 50 MG tablet Take 1 tablet (50 mg total) by mouth daily. **NEEDS OFFICE VISIT NOW** 10 tablet 0   losartan (COZAAR) 50 MG tablet Take 1 tablet (50 mg total) by mouth daily. 90 tablet 0   methimazole (TAPAZOLE) 5 MG tablet Take 1.5 tablets by mouth daily with a meal. 120 tablet 5   No current facility-administered medications for this visit.   PE: BP 120/88 (BP Location: Right Arm, Patient Position: Sitting, Cuff Size: Normal)   Pulse 84   Ht 5\' 6"  (1.676 m)   Wt 185 lb (83.9 kg)   LMP 09/12/2019 (Approximate)   SpO2 99%   BMI 29.86 kg/m  Wt Readings from Last 3  Encounters:  05/03/23 185 lb (83.9 kg)  11/08/22 178 lb 6.4 oz (80.9 kg)  05/04/22 169 lb (76.7 kg)   Constitutional: overweight, in NAD Eyes:  no exophthalmos ENT: no neck masses, no cervical lymphadenopathy Cardiovascular: RRR, No  MRG Respiratory: CTA B Musculoskeletal: no deformities Skin:no rashes Neurological: no tremor with outstretched hands  ASSESSMENT: 1.  Graves' disease  PLAN:  1. Patient with recurrent Graves' disease, on methimazole.  We initially started her on 10 mg in a.m. and 5 mg every afternoon and we were able to gradually decrease the dose to 2.5 mg daily.  However, afterwards, the dose fluctuated with the latest change being that decreased from 5 mg twice a day to 7.5 mg daily in 01/2023. -Reviewing her TSI's, they were initially elevated but then normal in 04/2020 and then undetectable at last check in 04/2021 -We discussed about definitive treatment of Graves' disease -RAI ablation versus thyroidectomy.  She would be more prone to thyroidectomy if absolutely needed.  We discussed about details of thyroidectomy including location, hospitalization time, and absolute need for levothyroxine afterwards.  We discussed that hypothyroidism is a more manageable and more benign condition compared to uncontrolled hyperthyroidism -At last visit, TFTs were normal so we continued the same dose of methimazole while she was contemplating the above options, however, afterwards, we again had to change the methimazole dose as mentioned above -At today's visit, we will check her TFTs and change the dose accordingly -For now continue 7.5 mg of methimazole daily -She will let me know if she decides about definitive treatment before next OV -I will see her back in 1 year but we will have her back in 6 months for labs  Needs refills.  Component     Latest Ref Rng 05/03/2023  Triiodothyronine,Free,Serum     2.3 - 4.2 pg/mL 3.8   T4,Free(Direct)     0.60 - 1.60 ng/dL 9.14   TSH      7.82 - 5.50 uIU/mL 1.16    Carlus Pavlov, MD PhD [facility]

## 2023-05-03 NOTE — Patient Instructions (Addendum)
Please continue methimazole 7.5 mg daily.  Please stop at [facility] lab.  Please come back for a follow-up appointment in 1 year but with labs in 6 months.

## 2023-05-15 ENCOUNTER — Ambulatory Visit (AMBULATORY_SURGERY_CENTER): Payer: Commercial Managed Care - PPO | Admitting: *Deleted

## 2023-05-15 ENCOUNTER — Other Ambulatory Visit: Payer: Self-pay

## 2023-05-15 VITALS — Ht 66.0 in | Wt 175.0 lb

## 2023-05-15 DIAGNOSIS — Z86010 Personal history of colonic polyps: Secondary | ICD-10-CM

## 2023-05-15 MED ORDER — NA SULFATE-K SULFATE-MG SULF 17.5-3.13-1.6 GM/177ML PO SOLN
1.0000 | Freq: Once | ORAL | 0 refills | Status: AC
Start: 2023-05-15 — End: 2023-05-17
  Filled 2023-05-15: qty 354, 1d supply, fill #0

## 2023-05-15 NOTE — Progress Notes (Signed)
Pt's name and DOB verified at the beginning of the pre-visit.  Pt denies any difficulty with ambulating,sitting, laying down or rolling side to side Gave both [facility] main # and MD on call # prior to instructions.  No egg or soy allergy known to patient  No issues known to pt with past sedation with any surgeries or procedures Patient denies ever being intubated Pt has no issues moving head neck or swallowing No FH of Malignant Hyperthermia Pt is not on diet pills Pt is not on home 02  Pt is not on blood thinners  Pt has frequent issues with constipation RN instructed pt to use Miralax per bottles instructions a week before prep days. Pt states they will Pt is not on dialysis Pt denise any abnormal heart rhythms  Pt denies any upcoming cardiac testing Pt encouraged to use to use Singlecare or Goodrx to reduce cost  Patient's chart reviewed by Cathlyn Parsons CNRA prior to pre-visit and patient appropriate for the [facility].  Pre-visit completed and red dot placed by patient's name on their procedure day (on provider's schedule).  . Visit by phone Pt states weight is 175 lb Instructed pt why it is important to and  to call if they have any changes in health or new medications. Directed them to the # given and on instructions.   Pt states they will.  Instructions reviewed with pt and pt states understanding. Instructed to review again prior to procedure. Pt states they will.  Instructions sent by mail with coupon and by my chart

## 2023-05-16 ENCOUNTER — Other Ambulatory Visit (HOSPITAL_COMMUNITY): Payer: Self-pay

## 2023-05-16 ENCOUNTER — Other Ambulatory Visit: Payer: Self-pay

## 2023-05-30 ENCOUNTER — Encounter: Payer: Self-pay | Admitting: Gastroenterology

## 2023-06-19 ENCOUNTER — Encounter: Payer: Self-pay | Admitting: Gastroenterology

## 2023-06-19 ENCOUNTER — Ambulatory Visit (AMBULATORY_SURGERY_CENTER): Payer: Managed Care, Other (non HMO) | Admitting: Gastroenterology

## 2023-06-19 VITALS — BP 104/73 | HR 68 | Temp 97.6°F | Resp 12 | Ht 66.0 in | Wt 175.0 lb

## 2023-06-19 DIAGNOSIS — Z86010 Personal history of colonic polyps: Secondary | ICD-10-CM

## 2023-06-19 DIAGNOSIS — Z09 Encounter for follow-up examination after completed treatment for conditions other than malignant neoplasm: Secondary | ICD-10-CM

## 2023-06-19 DIAGNOSIS — D120 Benign neoplasm of cecum: Secondary | ICD-10-CM

## 2023-06-19 DIAGNOSIS — D127 Benign neoplasm of rectosigmoid junction: Secondary | ICD-10-CM

## 2023-06-19 DIAGNOSIS — D128 Benign neoplasm of rectum: Secondary | ICD-10-CM

## 2023-06-19 DIAGNOSIS — D125 Benign neoplasm of sigmoid colon: Secondary | ICD-10-CM

## 2023-06-19 DIAGNOSIS — K635 Polyp of colon: Secondary | ICD-10-CM | POA: Diagnosis not present

## 2023-06-19 MED ORDER — SODIUM CHLORIDE 0.9 % IV SOLN
500.00 mL | Freq: Once | INTRAVENOUS | Status: DC
Start: 2023-06-19 — End: 2023-06-19

## 2023-06-19 NOTE — Op Note (Addendum)
[facility] Patient Name: Samantha Peck Procedure Date: 06/19/2023 1:23 PM MRN: [id number] Endoscopist: Napoleon Form , MD, [id number] Age: 57 Referring MD:  Date of Birth: [birth year] Gender: Female Account #: [id number] Procedure:                Colonoscopy Indications:              High risk colon cancer surveillance: Personal                            history of colonic polyps, High risk colon cancer                            surveillance: Personal history of adenoma less than                            10 mm in size Medicines:                Monitored Anesthesia Care Procedure:                Pre-Anesthesia Assessment:                           - Prior to the procedure, a History and Physical                            was performed, and patient medications and                            allergies were reviewed. The patient's tolerance of                            previous anesthesia was also reviewed. The risks                            and benefits of the procedure and the sedation                            options and risks were discussed with the patient.                            All questions were answered, and informed consent                            was obtained. Prior Anticoagulants: The patient has                            taken no anticoagulant or antiplatelet agents. ASA                            Grade Assessment: II - A patient with mild systemic                            disease. After reviewing the risks and benefits,  the patient was deemed in satisfactory condition to                            undergo the procedure.                           After obtaining informed consent, the colonoscope                            was passed under direct vision. Throughout the                            procedure, the patient's blood pressure, pulse, and                            oxygen saturations were monitored  continuously. The                            Olympus PCF-[id number] (#[id number]) Colonoscope was                            introduced through the anus and advanced to the the                            cecum, identified by appendiceal orifice and                            ileocecal valve. The colonoscopy was performed                            without difficulty. The patient tolerated the                            procedure well. The quality of the bowel                            preparation was good. The ileocecal valve,                            appendiceal orifice, and rectum were photographed. Scope In: 1:26:36 PM Scope Out: 1:39:49 PM Scope Withdrawal Time: 0 hours 10 minutes 34 seconds  Total Procedure Duration: 0 hours 13 minutes 13 seconds  Findings:                 The perianal and digital rectal examinations were                            normal.                           Three sessile polyps were found in the rectum,                            sigmoid colon and cecum. The polyps were 4 to 7 mm  in size. These polyps were removed with a cold                            snare. Resection and retrieval were complete.                           A few small-mouthed diverticula were found in the                            sigmoid colon and ascending colon.                           Non-bleeding external and internal hemorrhoids were                            found during retroflexion. The hemorrhoids were                            small. Complications:            No immediate complications. Estimated Blood Loss:     Estimated blood loss was minimal. Impression:               - Three 4 to 7 mm polyps in the rectum, in the                            sigmoid colon and in the cecum, removed with a cold                            snare. Resected and retrieved.                           - Diverticulosis in the sigmoid colon and in the                             ascending colon.                           - Non-bleeding external and internal hemorrhoids. Recommendation:           - Patient has a contact number available for                            emergencies. The signs and symptoms of potential                            delayed complications were discussed with the                            patient. Return to normal activities tomorrow.                            Written discharge instructions were provided to the  patient.                           - Resume previous diet.                           - Continue present medications.                           - Await pathology results.                           - Repeat colonoscopy in 3 - 7 years for                            surveillance based on pathology results. Napoleon Form, MD 06/19/2023 1:55:49 PM This report has been signed electronically.

## 2023-06-19 NOTE — Progress Notes (Signed)
Gastroenterology History and Physical   Primary Care Physician:  Kaleen Mask, MD   Reason for Procedure:  History of adenomatous colon polyps  Plan:    Surveillance colonoscopy with possible interventions as needed     HPI: Samantha Peck is a very pleasant 57 y.o. female here for surveillance colonoscopy. Denies any nausea, vomiting, abdominal pain, melena or bright red blood per rectum  The risks and benefits as well as alternatives of endoscopic procedure(s) have been discussed and reviewed. All questions answered. The patient agrees to proceed.    Past Medical History:  Diagnosis Date   Cancer (HCC)    skin   Graves disease    Hypertension    Thyroid disease     Past Surgical History:  Procedure Laterality Date   BREAST BIOPSY Right 2009   fibroadenoma   BUNIONECTOMY  1996   bilateral    Prior to Admission medications   Medication Sig Start Date End Date Taking? Authorizing Provider  chlorthalidone (HYGROTON) 25 MG tablet Take 1/2 tablet by mouth daily for fluid / hypertension 08/17/22  Yes   losartan (COZAAR) 50 MG tablet 1 tablet Orally Once a day   Yes [provider]  methimazole (TAPAZOLE) 5 MG tablet Take 1.5 tablets (7.5 mg total) by mouth daily with a meal 05/03/23  Yes Carlus Pavlov, MD    Current Outpatient Medications  Medication Sig Dispense Refill   chlorthalidone (HYGROTON) 25 MG tablet Take 1/2 tablet by mouth daily for fluid / hypertension 45 tablet 3   losartan (COZAAR) 50 MG tablet 1 tablet Orally Once a day     methimazole (TAPAZOLE) 5 MG tablet Take 1.5 tablets (7.5 mg total) by mouth daily with a meal 135 tablet 3   Current Facility-Administered Medications  Medication Dose Route Frequency Provider Last Rate Last Admin   0.9 %  sodium chloride infusion  500 mL Intravenous Once Napoleon Form, MD        Allergies as of 06/19/2023   (No Known Allergies)    Family History  Problem Relation Age of  Onset   Cancer Mother        Breast Cancer   Diabetes Mother    Heart disease Mother    Hypertension Mother    Breast cancer Mother 1   Hypertension Father    Cancer Maternal Grandmother        Breast Cancer   Breast cancer Maternal Grandmother    Colon cancer Neg Hx    Colon polyps Neg Hx    Esophageal cancer Neg Hx    Rectal cancer Neg Hx    Stomach cancer Neg Hx     Social History   Socioeconomic History   Marital status: Married    Spouse name: Not on file   Number of children: 3   Years of education: 14   Highest education level: Not on file  Occupational History   Occupation: Event organiser: [employer]  Tobacco Use   Smoking status: Former    Types: Cigarettes    Quit date: 12/13/2007    Years since quitting: 15.5   Smokeless tobacco: Never   Tobacco comments:    1-2 cigarettes once in a while  Vaping Use   Vaping Use: Never used  Substance and Sexual Activity   Alcohol use: Not Currently   Drug use: No   Sexual activity: Not Currently  Other Topics Concern   Not on file  Social History Narrative  Regular exercise-no   Caffeine Use-yes   Social Determinants of Health   Financial Resource Strain: Not on file  Food Insecurity: Not on file  Transportation Needs: Not on file  Physical Activity: Not on file  Stress: Not on file  Social Connections: Not on file  Intimate Partner Violence: Not on file    Review of Systems:  All other review of systems negative except as mentioned in the HPI.  Physical Exam: Vital signs in last 24 hours: Blood Pressure 119/73   Pulse 90   Temperature 97.6 F (36.4 C)   Height 5\' 6"  (1.676 m)   Weight 175 lb (79.4 kg)   Last Menstrual Period 09/12/2019 (Approximate)   Oxygen Saturation 96%   Body Mass Index 28.25 kg/m  General:   Alert, NAD Lungs:  Clear .   Heart:  Regular rate and rhythm Abdomen:  Soft, nontender and nondistended. Neuro/Psych:  Alert and cooperative. Normal mood and affect. A and O x  3  Reviewed labs, radiology imaging, old records and pertinent past GI work up  Patient is appropriate for planned procedure(s) and anesthesia in an ambulatory setting   K. Scherry Ran , MD [phone]

## 2023-06-19 NOTE — Patient Instructions (Addendum)
Handout provided on polyps, diverticulosis and hemorrhoids. Resume previous diet.  Continue present medications.  Await pathology results.  Repeat colonoscopy in 3-7 years for surveillance based on pathology results.   YOU HAD AN ENDOSCOPIC PROCEDURE TODAY AT THE [facility]:   Refer to the procedure report that was given to you for any specific questions about what was found during the examination.  If the procedure report does not answer your questions, please call your gastroenterologist to clarify.  If you requested that your care partner not be given the details of your procedure findings, then the procedure report has been included in a sealed envelope for you to review at your convenience later.  YOU SHOULD EXPECT: Some feelings of bloating in the abdomen. Passage of more gas than usual.  Walking can help get rid of the air that was put into your GI tract during the procedure and reduce the bloating. If you had a lower endoscopy (such as a colonoscopy or flexible sigmoidoscopy) you may notice spotting of blood in your stool or on the toilet paper. If you underwent a bowel prep for your procedure, you may not have a normal bowel movement for a few days.  Please Note:  You might notice some irritation and congestion in your nose or some drainage.  This is from the oxygen used during your procedure.  There is no need for concern and it should clear up in a day or so.  SYMPTOMS TO REPORT IMMEDIATELY:  Following lower endoscopy (colonoscopy or flexible sigmoidoscopy):  Excessive amounts of blood in the stool  Significant tenderness or worsening of abdominal pains  Swelling of the abdomen that is new, acute  Fever of 100F or higher  For urgent or emergent issues, a gastroenterologist can be reached at any hour by calling [phone]. Do not use MyChart messaging for urgent concerns.    DIET:  We do recommend a small meal at first, but then you may proceed to your regular  diet.  Drink plenty of fluids but you should avoid alcoholic beverages for 24 hours.  ACTIVITY:  You should plan to take it easy for the rest of today and you should NOT DRIVE or use heavy machinery until tomorrow (because of the sedation medicines used during the test).    FOLLOW UP: Our staff will call the number listed on your records the next business day following your procedure.  We will call around 7:15- 8:00 am to check on you and address any questions or concerns that you may have regarding the information given to you following your procedure. If we do not reach you, we will leave a message.     If any biopsies were taken you will be contacted by phone or by letter within the next 1-3 weeks.  Please call us at [phone] if you have not heard about the biopsies in 3 weeks.    SIGNATURES/CONFIDENTIALITY: You and/or your care partner have signed paperwork which will be entered into your electronic medical record.  These signatures attest to the fact that that the information above on your After Visit Summary has been reviewed and is understood.  Full responsibility of the confidentiality of this discharge information lies with you and/or your care-partner.

## 2023-06-19 NOTE — Progress Notes (Signed)
Pt's states no medical or surgical changes since previsit or office visit. 

## 2023-06-19 NOTE — Progress Notes (Signed)
Pt resting comfortably. VSS. Airway intact. SBAR complete to RN. All questions answered.   

## 2023-06-20 ENCOUNTER — Telehealth: Payer: Self-pay | Admitting: *Deleted

## 2023-06-20 NOTE — Telephone Encounter (Signed)
  Follow up Call-     06/19/2023   12:40 PM  Call back number  Post procedure Call Back phone  # [phone]  Permission to leave phone message Yes     Patient questions:  Do you have a fever, pain , or abdominal swelling? No. Pain Score  0 *  Have you tolerated food without any problems? Yes.    Have you been able to return to your normal activities? Yes.    Do you have any questions about your discharge instructions: Diet   No. Medications  No. Follow up visit  No.  Do you have questions or concerns about your Care? No.  Actions: * If pain score is 4 or above: No action needed, pain <4.

## 2023-06-22 ENCOUNTER — Encounter: Payer: Self-pay | Admitting: Gastroenterology

## 2023-07-12 ENCOUNTER — Other Ambulatory Visit (HOSPITAL_COMMUNITY): Payer: Self-pay

## 2023-07-27 ENCOUNTER — Other Ambulatory Visit (HOSPITAL_COMMUNITY): Payer: Self-pay

## 2023-07-27 ENCOUNTER — Other Ambulatory Visit: Payer: Self-pay

## 2023-07-27 MED ORDER — LOSARTAN POTASSIUM 50 MG PO TABS
50.0000 mg | ORAL_TABLET | Freq: Every day | ORAL | 0 refills | Status: DC
  Filled 2023-07-27: qty 90, 90d supply, fill #0

## 2023-07-27 MED ORDER — CHLORTHALIDONE 25 MG PO TABS
12.5000 mg | ORAL_TABLET | Freq: Every day | ORAL | 0 refills | Status: DC
  Filled 2023-07-27: qty 45, 90d supply, fill #0

## 2023-07-28 NOTE — Progress Notes (Signed)
GYNECOLOGY  VISIT   HPI: 57 y.o.   Married  Caucasian  female   G3P3 with Patient's last menstrual period was 09/12/2019 (approximate).   here for annual exam and to discuss rectocele. Constipation.  No splinting.  Can have loose stools.  No fecal incontinence.  Some urinary incontinence and not able to get to the bathroom on time.   Voiding every hour during the day.  Having nocturia.  Can leak with cough or sneeze.   Takes a diuretic.   Wants surgery.   GYNECOLOGIC HISTORY: Patient's last menstrual period was 09/12/2019 (approximate). Contraception:  PMP Menopausal hormone therapy:  n/a Last mammogram:  02/27/23 Breast Density Cat A, BI-RADS CAT 1 neg Last pap smear:   02/16/22 neg: HR HPV neg, 01/07/20 neg: HR HPV neg Not currently sexually active No tobacco No Bone density Colonoscopy- 06/19/23 - polyps - due 3 years.         OB History     Gravida  3   Para  3   Term      Preterm      AB      Living  3      SAB      IAB      Ectopic      Multiple      Live Births                 Patient Active Problem List   Diagnosis Date Noted   Sprain of unspecified ligament of right ankle, initial encounter 09/19/2022   Contusion of left knee 09/19/2022   Weight gain 04/23/2018   Graves disease 07/13/2012    Past Medical History:  Diagnosis Date   Cancer (HCC)    skin   Graves disease    Hypertension    Thyroid disease     Past Surgical History:  Procedure Laterality Date   BREAST BIOPSY Right 2009   fibroadenoma   BUNIONECTOMY  1996   bilateral    Current Outpatient Medications  Medication Sig Dispense Refill   chlorthalidone (HYGROTON) 25 MG tablet Take 0.5 tablets (12.5 mg total) by mouth daily for fluid/hypertension 45 tablet 0   losartan (COZAAR) 50 MG tablet 1 tablet Orally Once a day     losartan (COZAAR) 50 MG tablet Take 1 tablet (50 mg total) by mouth daily. 90 tablet 0   methimazole (TAPAZOLE) 5 MG tablet Take 1.5 tablets (7.5 mg  total) by mouth daily with a meal 135 tablet 3   No current facility-administered medications for this visit.     ALLERGIES: Patient has no known allergies.  Family History  Problem Relation Age of Onset   Cancer Mother        Breast Cancer   Diabetes Mother    Heart disease Mother    Hypertension Mother    Breast cancer Mother 42   Hypertension Father    Cancer Maternal Grandmother        Breast Cancer   Breast cancer Maternal Grandmother    Colon cancer Neg Hx    Colon polyps Neg Hx    Esophageal cancer Neg Hx    Rectal cancer Neg Hx    Stomach cancer Neg Hx     Social History   Socioeconomic History   Marital status: Married    Spouse name: Not on file   Number of children: 3   Years of education: 14   Highest education level: Not on file  Occupational History  Occupation: Event organiser: [employer]  Tobacco Use   Smoking status: Former    Current packs/day: 0.00    Types: Cigarettes    Quit date: 12/13/2007    Years since quitting: 15.6   Smokeless tobacco: Never   Tobacco comments:    1-2 cigarettes once in a while  Vaping Use   Vaping status: Never Used  Substance and Sexual Activity   Alcohol use: Not Currently   Drug use: No   Sexual activity: Not Currently  Other Topics Concern   Not on file  Social History Narrative   Regular exercise-no   Caffeine Use-yes   Social Determinants of Health   Financial Resource Strain: Not on file  Food Insecurity: Not on file  Transportation Needs: Not on file  Physical Activity: Not on file  Stress: Not on file  Social Connections: Not on file  Intimate Partner Violence: Not on file    Review of Systems  All other systems reviewed and are negative.   PHYSICAL EXAMINATION:    BP 126/82 (BP Location: Left Arm, Patient Position: Sitting, Cuff Size: Normal)   Pulse 84   Ht 5' 6.5" (1.689 m)   Wt 188 lb (85.3 kg)   LMP 09/12/2019 (Approximate)   SpO2 97%   BMI 29.89 kg/m     General appearance:  alert, cooperative and appears stated age Head: Normocephalic, without obvious abnormality, atraumatic Neck: no adenopathy, supple, symmetrical, trachea midline and thyroid normal to inspection and palpation Lungs: clear to auscultation bilaterally Breasts: normal appearance, no masses or tenderness, No nipple retraction or dimpling, No nipple discharge or bleeding, No axillary or supraclavicular adenopathy Heart: regular rate and rhythm Abdomen: soft, non-tender, no masses,  no organomegaly Extremities: extremities normal, atraumatic, no cyanosis or edema Skin: Skin color, texture, turgor normal. No rashes or lesions Lymph nodes: Cervical, supraclavicular, and axillary nodes normal. No abnormal inguinal nodes palpated Neurologic: Grossly normal  Pelvic: External genitalia:  no lesions              Urethra:  normal appearing urethra with no masses, tenderness or lesions              Bartholins and Skenes: normal                 Vagina: normal appearing vagina with normal color and discharge, no lesions.  Good anterior vaginal wall support. Third degree rectocele.               Cervix: no lesions.  Good uterine support.                 Bimanual Exam:  Uterus:  normal size, contour, position, consistency, mobility, non-tender              Adnexa: no mass, fullness, tenderness              Rectal exam: yes.  Confirms.              Anus:  normal sphincter tone, no lesions  Chaperone was present for exam:  Warren Lacy, CMA  ASSESSMENT  Well woman visit with GYN exam.  FH breast cancer - mother age 48 and maternal grandmother.  Rectocele.    Mixed incontinence.  Hx hyperthyroidism.   PLAN  Mammogram yearly.  Pap and HR HPV in 2028.  Colonoscopy due in 2027.  Labs with PCP.  Referral to Dr. Florian Buff for mixed incontinence and rectocele.  Fu yearly and prn.

## 2023-08-07 ENCOUNTER — Ambulatory Visit (INDEPENDENT_AMBULATORY_CARE_PROVIDER_SITE_OTHER): Payer: Managed Care, Other (non HMO) | Admitting: Obstetrics and Gynecology

## 2023-08-07 ENCOUNTER — Telehealth: Payer: Self-pay | Admitting: Obstetrics and Gynecology

## 2023-08-07 ENCOUNTER — Encounter: Payer: Self-pay | Admitting: Obstetrics and Gynecology

## 2023-08-07 VITALS — BP 126/82 | HR 84 | Ht 66.5 in | Wt 188.0 lb

## 2023-08-07 DIAGNOSIS — N816 Rectocele: Secondary | ICD-10-CM | POA: Diagnosis not present

## 2023-08-07 DIAGNOSIS — N3946 Mixed incontinence: Secondary | ICD-10-CM

## 2023-08-07 DIAGNOSIS — Z01419 Encounter for gynecological examination (general) (routine) without abnormal findings: Secondary | ICD-10-CM | POA: Diagnosis not present

## 2023-08-07 NOTE — Telephone Encounter (Signed)
Please make referral to Dr. Florian Buff for rectocele and mixed urinary incontinence.   Patient is anxious to proceed with surgery.

## 2023-08-07 NOTE — Telephone Encounter (Signed)
Referral placed. BLV to f/u via work queue. Routing to provider for final review and closing.

## 2023-08-07 NOTE — Patient Instructions (Addendum)
EXERCISE AND DIET:  We recommended that you start or continue a regular exercise program for good health. Regular exercise means any activity that makes your heart beat faster and makes you sweat.  We recommend exercising at least 30 minutes per day at least 3 days a week, preferably 4 or 5.  We also recommend a diet low in fat and sugar.  Inactivity, poor dietary choices and obesity can cause diabetes, heart attack, stroke, and kidney damage, among others.    ALCOHOL AND SMOKING:  Women should limit their alcohol intake to no more than 7 drinks/beers/glasses of wine (combined, not each!) per week. Moderation of alcohol intake to this level decreases your risk of breast cancer and liver damage. And of course, no recreational drugs are part of a healthy lifestyle.  And absolutely no smoking or even second hand smoke. Most people know smoking can cause heart and lung diseases, but did you know it also contributes to weakening of your bones? Aging of your skin?  Yellowing of your teeth and nails?  CALCIUM AND VITAMIN D:  Adequate intake of calcium and Vitamin D are recommended.  The recommendations for exact amounts of these supplements seem to change often, but generally speaking 600 mg of calcium (either carbonate or citrate) and 800 units of Vitamin D per day seems prudent. Certain women may benefit from higher intake of Vitamin D.  If you are among these women, your doctor will have told you during your visit.    PAP SMEARS:  Pap smears, to check for cervical cancer or precancers,  have traditionally been done yearly, although recent scientific advances have shown that most women can have pap smears less often.  However, every woman still should have a physical exam from her gynecologist every year. It will include a breast check, inspection of the vulva and vagina to check for abnormal growths or skin changes, a visual exam of the cervix, and then an exam to evaluate the size and shape of the uterus and  ovaries.  And after 56 years of age, a rectal exam is indicated to check for rectal cancers. We will also provide age appropriate advice regarding health maintenance, like when you should have certain vaccines, screening for sexually transmitted diseases, bone density testing, colonoscopy, mammograms, etc.   MAMMOGRAMS:  All women over 33 years old should have a yearly mammogram. Many facilities now offer a "3D" mammogram, which may cost around $50 extra out of pocket. If possible,  we recommend you accept the option to have the 3D mammogram performed.  It both reduces the number of women who will be called back for extra views which then turn out to be normal, and it is better than the routine mammogram at detecting truly abnormal areas.    COLONOSCOPY:  Colonoscopy to screen for colon cancer is recommended for all women at age 40.  We know, you hate the idea of the prep.  We agree, BUT, having colon cancer and not knowing it is worse!!  Colon cancer so often starts as a polyp that can be seen and removed at colonscopy, which can quite literally save your life!  And if your first colonoscopy is normal and you have no family history of colon cancer, most women don't have to have it again for 10 years.  Once every ten years, you can do something that may end up saving your life, right?  We will be happy to help you get it scheduled when you are ready.  Be sure to check your insurance coverage so you understand how much it will cost.  It may be covered as a preventative service at no cost, but you should check your particular policy.      Urodynamic Testing  Urodynamic tests are done to determine how well your lower urinary tract is working. The lower urinary tract includes your bladder and the part of your body that drains urine from the bladder (urethra). When your kidneys filter your blood, urine is stored in your bladder until you feel the urge to urinate. Urination requires coordination between the  nerves and muscles of your bladder and urethra. When your lower urinary tract is working well, you should be able to: Start urinating when your bladder is full. Empty your bladder completely. Control the flow of your urine. Why do I need urodynamic testing? You may need urodynamic testing to help find the cause of any of these problems: Leaking urine (incontinence). Problems starting or stopping your urine flow. Frequent or painful urination. Frequent urinary tract infections. Being unable to empty your bladder completely. Having strong urges to pass urine (urgency). Having a weak flow of urine. What are the risks? Generally, these tests are safe. However, some of the tests have risks, including: Discomfort. Frequent urge to urinate. Bleeding. Infection. Allergic reactions to medicines or dyes (contrast material). What happens before the test? Ask your health care provider about changing or stopping your regular medicines. This is especially important if you are taking diabetes medicines or blood thinners. You may be asked to avoid urinating before coming to the test so that you arrive with a full bladder. Tell a health care provider about: Any allergies you have. All medicines you are taking, including vitamins, herbs, eye drops, creams, and over-the-counter medicines. Whether you are pregnant or may be pregnant. What happens during the test? You may have various urodynamic tests. The tests may be done separately or may all be done during one visit. You may be given an antibiotic medicine before or after testing to help prevent infection. The types of tests that may be done include: Uroflowmetry This test measures how much urine you pass and how long it takes to pass. You will urinate into a certain type of toilet or device (flowmeter). The device will measure the volume and the time of your urine flow. These measurements will be sent to a computer that creates a graph of your  urine flow. Postvoid residual measurement This test measures how much urine is left in your bladder after you urinate. The test may be done with ultrasound. In this method, sound waves and a computer will be used to create an image of your bladder. The test can also be done by inserting a thin, flexible tube (catheter) into your bladder after you urinate. The remaining urine will be removed through the catheter so it can be measured. Remaining urine will be measured in milliliters (mL). If you have more than 100 mL left in your bladder after you urinate, your bladder is not emptying as it should. Cystometric testing This test uses a type of bladder catheter that can measure pressure. You may be given a medicine to numb the area (local anesthetic). The area around the opening of your urethra will be cleaned. A urinary catheter will be passed through your urethra into your bladder and used to empty your bladder completely. A measuring catheter will be placed, and your bladder will be filled with warm, germ-free (sterile) water. Pressure measurements will  be taken: As your bladder fills. When you feel the need to urinate. As your bladder is emptied. You may be asked to cough or bear down to check for leakage. In some cases, your bladder may be filled with a material that shows up on X-rays (contrast material) so that X-ray pictures can be taken during the test. Electromyogram This test measures the electrical activity of the nerves and muscles of your bladder and the opening of your urethra. Sticky patches (electrodes) will be placed near your rectum and urethra to measure electrical activity. The measurements will show how well your nerves are communicating with your muscles. What can I expect after the test? You should be able to go home right away and do your usual activities. You may be told to drink a glass of water every 30 minutes for the first 2 hours after testing. Taking a warm bath  or using warm, wet cloths (warm compresses) may relieve any discomfort near your urethra. What do the results mean? Talk with your health care provider about what your results mean. Some common causes for abnormal results from urodynamic tests include: Enlarged prostate in men. Overactive bladder. Urinary tract infection. Nervous system diseases. Spinal cord damage. Questions to ask your health care provider Ask your health care provider, or the department that is doing the test: When will my results be ready? How will I get my results? What are my treatment options? What other tests do I need? What are my next steps? Contact a health care provider if: You have pain. You have blood in your urine. You have chills. You have a fever. Summary Urodynamic tests are done to determine how well your lower urinary tract is working. The lower urinary tract includes your bladder and urethra. You may need urodynamic testing to help find the cause of various problems with urination, such as leaking urine (incontinence) or problems starting or stopping your urine flow. You may have various urodynamic tests. The tests may be done separately or may all be done during one testing visit. Talk with your health care provider about what your results mean. Contact your health care provider if you have pain, chills, a fever, or blood in your urine. This information is not intended to replace advice given to you by your health care provider. Make sure you discuss any questions you have with your health care provider. Document Revised: 08/11/2021 Document Reviewed: 07/03/2020 Elsevier Patient Education  2024 [employer].

## 2023-08-08 ENCOUNTER — Other Ambulatory Visit (HOSPITAL_COMMUNITY): Payer: Self-pay

## 2023-08-18 ENCOUNTER — Encounter: Payer: Self-pay | Admitting: Obstetrics

## 2023-08-18 ENCOUNTER — Other Ambulatory Visit (HOSPITAL_COMMUNITY): Payer: Self-pay

## 2023-08-18 ENCOUNTER — Other Ambulatory Visit: Payer: Self-pay

## 2023-08-18 ENCOUNTER — Ambulatory Visit: Payer: Managed Care, Other (non HMO) | Admitting: Obstetrics

## 2023-08-18 VITALS — BP 116/79 | HR 79 | Ht 66.14 in | Wt 179.0 lb

## 2023-08-18 DIAGNOSIS — N3946 Mixed incontinence: Secondary | ICD-10-CM

## 2023-08-18 DIAGNOSIS — R350 Frequency of micturition: Secondary | ICD-10-CM | POA: Diagnosis not present

## 2023-08-18 DIAGNOSIS — K5900 Constipation, unspecified: Secondary | ICD-10-CM

## 2023-08-18 DIAGNOSIS — N816 Rectocele: Secondary | ICD-10-CM

## 2023-08-18 LAB — POCT URINALYSIS DIPSTICK
Bilirubin, UA: NEGATIVE
Blood, UA: NEGATIVE
Glucose, UA: NEGATIVE
Ketones, UA: NEGATIVE
Leukocytes, UA: NEGATIVE
Nitrite, UA: NEGATIVE
Protein, UA: NEGATIVE
Spec Grav, UA: 1.025 (ref 1.010–1.025)
Urobilinogen, UA: 0.2 U/dL
pH, UA: 7.5 (ref 5.0–8.0)

## 2023-08-18 MED ORDER — GEMTESA 75 MG PO TABS
75.0000 mg | ORAL_TABLET | Freq: Every day | ORAL | 11 refills | Status: DC
  Filled 2023-08-18: qty 30, 30d supply, fill #0
  Filled 2023-10-20: qty 30, 30d supply, fill #1

## 2023-08-18 MED ORDER — VIBEGRON 75 MG PO TABS: 75.0000 mg | ORAL_TABLET | Freq: Every day | ORAL | Status: DC

## 2023-08-18 NOTE — Patient Instructions (Signed)
You have a stage 2 (out of 4) prolapse.  We discussed the fact that it is not life threatening but there are several treatment options. For treatment of pelvic organ prolapse, we discussed options for management including expectant management, conservative management, and surgical management, such as Kegels, a pessary, pelvic floor physical therapy, and specific surgical procedures.    For constipation, we reviewed the importance of a better bowel regimen.  We also discussed the importance of avoiding chronic straining, as it can exacerbate her pelvic floor symptoms; we discussed treating constipation and straining prior to surgery, as postoperative straining can lead to damage to the repair and recurrence of symptoms. We discussed initiating therapy with increasing fluid intake, fiber supplementation, stool softeners, and laxatives such as miralax.   We discussed the symptoms of overactive bladder (OAB), which include urinary urgency, urinary frequency, night-time urination, with or without urge incontinence.  We discussed management including behavioral therapy (decreasing bladder irritants by following a bladder diet, urge suppression strategies, timed voids, bladder retraining), physical therapy, medication; and for refractory cases posterior tibial nerve stimulation, sacral neuromodulation, and intravesical botulinum toxin injection.   For Beta-3 agonist medication, we discussed the potential side effect of elevated blood pressure which is more likely to occur in individuals with uncontrolled hypertension. You were given Gemtesa samples.  It can take a month to start working so give it time, but if you have bothersome side effects call sooner and we can try a different medication.  Call us if you have trouble filling the prescription or if it's not covered by your insurance.  Follow-up in 4 weeks to see how your symptoms are responding to treatment.  Start Kegel exercises  Switch to decaffeinated  beverages to reassess overactive bladder symptoms.

## 2023-08-18 NOTE — Progress Notes (Signed)
[facility] New Patient Evaluation and Consultation  Referring Provider: Kaleen Mask, * PCP: Kaleen Mask, MD Date of Service: 08/18/2023  SUBJECTIVE Chief Complaint: New Patient (Initial Visit) Samantha Peck is a 56 y.o. female here for a consult for rectocele and mixed incontinence./)  History of Present Illness: Samantha Peck is a 57 y.o. White or Caucasian female seen in consultation at the request of Dr. Ricki Miller for evaluation of stage III rectocele and mixed urinary incontinence.    Review of records significant for: Grave's disease  Urinary Symptoms: Leaks urine with cough/ sneeze, going from sitting to standing, with a full bladder, and while asleep since 3rd delivery  Urgency and frequency most bothersome Leaks with urgency 1-2 time(s) per week. Leaks with activity 1-2 times per month Denies pad use due to small volume leakage, changes underwear 1-2 times per month Patient is bothered by UI symptoms.  Day time voids 11-20 times per day.  Nocturia: 3-7 times per night to void. Voiding dysfunction:  empties bladder well.  Patient does not use a catheter to empty bladder.  When urinating, patient feels the need to urinate multiple times in a row Drinks: 20oz of coffee, 4 cans of soft drinks per day  UTIs:  0  UTI's in the last year.   Denies history of blood in urine, kidney or bladder stones, pyelonephritis, bladder cancer, and kidney cancer  Pelvic Organ Prolapse Symptoms:                  Patient Admits to a feeling of a golf ball size bulge the vaginal area past the opening. It has been present for 28 years since delivery of 3rd child.  Patient Admits to seeing a bulge.  This bulge is bothersome.  Bowel Symptom: Bowel movements: 1-3 time(s) per week Stool consistency: hard, sometimes loose depending on diet Straining: yes.  Splinting: no.  Incomplete evacuation: yes.  Patient Denies accidental bowel leakage / fecal  incontinence Bowel regimen: fiber gummy started this week Last colonoscopy: Date 06/19/23, Results with 3 sessile polyps in sigmoid colon and cecum. Repeat in 3-7 years per Dr. Lavon Paganini  Sexual Function Sexually active: no.  Sexual orientation: Straight Pain with sex: No  Pelvic Pain Denies pelvic pain  Past Medical History:  Past Medical History:  Diagnosis Date   Cancer (HCC)    skin   Graves disease    Hypertension    Thyroid disease      Past Surgical History:   Past Surgical History:  Procedure Laterality Date   BREAST BIOPSY Right 2009   fibroadenoma   BUNIONECTOMY  1996   bilateral     Past OB/GYN History: G3 P 3003 Vaginal deliveries: 3,  Forceps/ Vacuum deliveries: no, Cesarean section: no Largest infant 6lb 9oz, denies repair of vaginal laceration  Menopausal: Yes, at age 36 Last pap smear was 02/16/22.  Any history of abnormal pap smears: no.   Medications: Patient has a current medication list which includes the following prescription(s): chlorthalidone, losartan, losartan, methimazole, gemtesa, and vibegron.   Allergies: Patient has No Known Allergies.   Social History:  Social History   Tobacco Use   Smoking status: Former    Current packs/day: 0.00    Types: Cigarettes    Quit date: 12/13/2007    Years since quitting: 15.6   Smokeless tobacco: Never   Tobacco comments:    1-2 cigarettes once in a while  Vaping Use   Vaping status: Never Used  Substance Use Topics   Alcohol use: Not Currently   Drug use: No    Relationship status: married Patient lives with husband.   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 Regular exercise: No History of abuse: No  Family History:   Family History  Problem Relation Age of Onset   Cancer Mother        Breast Cancer   Diabetes Mother    Heart disease Mother    Hypertension Mother    Breast cancer Mother 28   Hypertension Father    Cancer Maternal Grandmother        Breast  Cancer   Breast cancer Maternal Grandmother    Colon cancer Neg Hx    Colon polyps Neg Hx    Esophageal cancer Neg Hx    Rectal cancer Neg Hx    Stomach cancer Neg Hx      Review of Systems: Review of Systems  Constitutional:  Positive for malaise/fatigue. Negative for fever and weight loss.       Weight gain  Respiratory:  Negative for cough, shortness of breath and wheezing.   Cardiovascular:  Positive for leg swelling. Negative for chest pain and palpitations.  Gastrointestinal:  Negative for abdominal pain and blood in stool.  Genitourinary:  Positive for frequency and urgency. Negative for dysuria and hematuria.       Vaginal discharge  Musculoskeletal:        Leg swelling  Neurological:  Negative for dizziness and weakness.  Endo/Heme/Allergies:  Does not bruise/bleed easily.  Psychiatric/Behavioral:  The patient is not nervous/anxious.      OBJECTIVE Physical Exam: Vitals:   08/18/23 1111  BP: 116/79  Pulse: 79  Weight: 179 lb (81.2 kg)  Height: 5' 6.14" (1.68 m)    Physical Exam Vitals reviewed. Exam conducted with a chaperone present.  Constitutional:      General: She is not in acute distress.    Appearance: Normal appearance.  Cardiovascular:     Rate and Rhythm: Normal rate.  Pulmonary:     Effort: Pulmonary effort is normal. No respiratory distress.  Abdominal:     General: Abdomen is flat. There is no distension.     Palpations: Abdomen is soft.     Tenderness: There is no abdominal tenderness.  Genitourinary:    Labia:        Right: No rash, tenderness, lesion or injury.        Left: No rash, tenderness, lesion or injury.      Urethra: No prolapse, urethral pain, urethral swelling or urethral lesion.     Vagina: No signs of injury and foreign body. Prolapsed vaginal walls present. No vaginal discharge, erythema, tenderness, bleeding or lesions.     Cervix: Normal. No cervical motion tenderness or friability.     Uterus: Normal. Not enlarged, not  fixed, not tender and no uterine prolapse.      Adnexa: Right adnexa normal and left adnexa normal.     Rectum: Normal. No mass or tenderness. Normal anal tone.     Comments: CST: negative  Reflexes: bulbocavernosis present, anocutaneous present bilaterally.  Pelvic floor strength IV/V, puborectalis IV/V external anal sphincter IV/V  Pelvic floor musculature: Right levator non-tender, Right obturator mildly tender, Left levator non-tender, Left obturator mildly tender  Neurological:     Mental Status: She is alert.   GU / Detailed Urogynecologic Evaluation:   POP-Q:   POP-Q  -2  Aa   -2                                           Ba  -5                                              C   4                                            Gh  2                                            Pb  7                                            tvl   2                                            Ap  2                                            Bp  -6                                              D    Rectal Exam:  Normal sphincter tone, moderate distal rectocele, , no rectal masses, no sign of dyssynergia when asking the patient to bear down.  Post-Void Residual (PVR) by Bladder Scan: In order to evaluate bladder emptying, we discussed obtaining a postvoid residual and patient agreed to this procedure.  Procedure: The ultrasound unit was placed on the patient's abdomen in the suprapubic region after the patient had voided. A PVR of 6 ml was obtained by bladder scan.  Laboratory Results: @ENCLABS @   I visualized the urine specimen, noting the specimen to be clear yellow negative for leuk/heme/nit  ASSESSMENT AND PLAN Samantha Peck is a 57 y.o. with:  1. Pelvic organ prolapse quantification stage 3 rectocele   2. Mixed stress and urge urinary incontinence   3. Constipation, unspecified constipation type   4. Urinary frequency    Stage III  pelvic organ prolapse - For treatment of pelvic organ prolapse, we discussed options for management including expectant management, conservative management, and surgical management, such as Kegels, a pessary, pelvic floor physical therapy, and specific surgical procedures. - start kegels with handout provided - pt desires to proceed with surgical intervention prior to the end of the year due to changes in insurance - return for urodynamic testing and discussed need to repeat exam to r/o  apical prolapse  Mixed urinary incontinence - For treatment of stress urinary incontinence,  non-surgical options include expectant management, weight loss, physical therapy, as well as a pessary.  Surgical options include a midurethral sling, Burch urethropexy, and transurethral injection of a bulking agent. - discussed urodynamics to assess SUI and consider possible concomitant anti-incontinence surgery at the time of prolapse repair  Constipation - For constipation, we reviewed the importance of a better bowel regimen.  We also discussed the importance of avoiding chronic straining, as it can exacerbate her pelvic floor symptoms; we discussed treating constipation and straining prior to surgery, as postoperative straining can lead to damage to the repair and recurrence of symptoms. We discussed initiating therapy with increasing fluid intake, fiber supplementation, stool softeners, and laxatives such as miralax.  - continue fiber gummy and slowly titrate up until symptomatic relief - Trial of miralax daily if lack of relief from fiber supplementation - discussed important of optimization of stool consistency prior to surgery to avoid straining - encouraged squatty potty  Urinary frequency - more bothered by OAB - POCT UA negative - We discussed the symptoms of overactive bladder (OAB), which include urinary urgency, urinary frequency, nocturia, with or without urge incontinence.  While we do not know the exact  etiology of OAB, several treatment options exist. We discussed management including behavioral therapy (decreasing bladder irritants, urge suppression strategies, timed voids, bladder retraining), physical therapy, medication; for refractory cases posterior tibial nerve stimulation, sacral neuromodulation, and intravesical botulinum toxin injection.  For anticholinergic medications, we discussed the potential side effects of anticholinergics including dry eyes, dry mouth, constipation, cognitive impairment and urinary retention. For Beta-3 agonist medication, we discussed the potential side effect of elevated blood pressure which is more likely to occur in individuals with uncontrolled hypertension. - start Kegels - switch to decaf sodas to reassess symptoms - Samples and Rx provided for Gemtesa.  Loleta Chance, MD  Time spent: I spent 66 minutes dedicated to the care of this patient on the date of this encounter to include pre-visit review of records, face-to-face time with the patient discussing treatment options for pelvic organ prolapse, mixed urinary incontinence, urodynamic testing, and post visit documentation and ordering medication/ testing.

## 2023-08-29 ENCOUNTER — Other Ambulatory Visit (HOSPITAL_COMMUNITY): Payer: Self-pay

## 2023-08-29 ENCOUNTER — Encounter: Payer: Self-pay | Admitting: Obstetrics and Gynecology

## 2023-08-29 ENCOUNTER — Ambulatory Visit: Payer: Managed Care, Other (non HMO) | Admitting: Obstetrics and Gynecology

## 2023-08-29 VITALS — BP 133/83 | HR 84

## 2023-08-29 DIAGNOSIS — N393 Stress incontinence (female) (male): Secondary | ICD-10-CM

## 2023-08-29 DIAGNOSIS — N3946 Mixed incontinence: Secondary | ICD-10-CM

## 2023-08-29 DIAGNOSIS — N3281 Overactive bladder: Secondary | ICD-10-CM | POA: Diagnosis not present

## 2023-08-29 DIAGNOSIS — R350 Frequency of micturition: Secondary | ICD-10-CM

## 2023-08-29 LAB — POCT URINALYSIS DIPSTICK
Bilirubin, UA: NEGATIVE
Blood, UA: NEGATIVE
Glucose, UA: NEGATIVE
Ketones, UA: NEGATIVE
Nitrite, UA: NEGATIVE
Protein, UA: NEGATIVE
Spec Grav, UA: <=1.005 — AB (ref 1.010–1.025)
Urobilinogen, UA: 0.2 U/dL
pH, UA: 6 (ref 5.0–8.0)

## 2023-08-29 MED ORDER — PHENAZOPYRIDINE HCL 95 MG PO TABS
95.0000 mg | ORAL_TABLET | Freq: Three times a day (TID) | ORAL | 1 refills | Status: DC | PRN
  Filled 2023-08-29 (×2): qty 12, 4d supply, fill #0

## 2023-08-29 NOTE — Progress Notes (Signed)
[facility] Urodynamics Procedure  Referring Physician: Kaleen Mask, * Date of Procedure: 08/29/2023  Samantha Peck is a 57 y.o. female who presents for urodynamic evaluation. Indication(s) for study: occult SUI  Vital Signs: BP 133/83   Pulse 84   LMP 09/12/2019 (Approximate)   Laboratory Results: A catheterized urine specimen revealed:  POC urine: +Trace leukocytes    Voiding Diary: Not Done  Procedure Timeout:  The correct patient was verified and the correct procedure was verified. The patient was in the correct position and safety precautions were reviewed based on at the patient's history.  Urodynamic Procedure A 84F dual lumen urodynamics catheter was placed under sterile conditions into the patient's bladder. A 84F catheter was placed into the rectum in order to measure abdominal pressure. EMG patches were placed in the appropriate position.  All connections were confirmed and calibrations/adjusted made. Saline was instilled into the bladder through the dual lumen catheters.  Cough/valsalva pressures were measured periodically during filling.  Patient was allowed to void.  The bladder was then emptied of its residual.  UROFLOW: Revealed a Qmax of 140 mL/sec.  She voided 12 mL and had a residual of 25 mL.  It was a normal pattern and represented normal habits.   CMG: This was performed with sterile water in the sitting position at a fill rate of 20-30 mL/min.    First sensation of fullness was 91 mLs,  First urge was 114 mLs,  Strong urge was 188 mLs and  Capacity was 436 mLs  Stress incontinence was demonstrated Highest positive Barrier CLPP was 98 cmH20 at 201 ml. Highest positive Barrier VLPP was 55 cmH20 at 201 ml.  Detrusor function was overactive, with phasic contractions seen.  The first occurred at 88 mL to 5 cm of water and was associated with urge.  Compliance:  Normal. End fill detrusor pressure was 8cmH20.  Calculated  compliance was 54.32mL/cmH20  UPP: MUCP with barrier reduction was 70 cm of water.    MICTURITION STUDY: Voiding was performed with reduction using scopettes in the sitting position.  Pdet at Qmax was 100 cm of water.  Qmax was 24 mL/sec.  It was a normal pattern.  She voided 431 mL and had a residual of 5 mL.  It was a volitional void, sustained detrusor contraction was present and abdominal straining was not present  EMG: This was performed with patches.  She had voluntary contractions, recruitment with fill was present and urethral sphincter was relaxed with void.  The details of the procedure with the study tracings have been scanned into EPIC.   Urodynamic Impression:  1. Sensation was normal; capacity was normal 2. Stress Incontinence was demonstrated at normal pressures; 3. 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 4. Emptying was normal with a normal PVR, a sustained detrusor contraction present,  abdominal straining not present, dyssynergic urethral sphincter activity on EMG.  Plan: - The patient will follow up  to discuss the findings and treatment options.

## 2023-08-29 NOTE — Addendum Note (Signed)
Addended by: Thressa Sheller on: 08/29/2023 09:52 AM   Modules accepted: Orders

## 2023-08-30 NOTE — Progress Notes (Unsigned)
[facility] Pre-Operative H&P  Subjective Chief Complaint: Samantha Peck presents for a preoperative encounter.   History of Present Illness: Samantha Peck is a 57 y.o. female who presents for preoperative visit.  She is scheduled to undergo *** on ***.  Her symptoms include ***, and she was was found to have Stage {Roman # I-V:19040} anterior, Stage III posterior, Stage {Roman # I-V:19040} apical prolapse.   Started miralax daily with ***, bowel movement ***x/week Gemtesa and switch to decaffeinated sodas with ***  Urodynamics 08/29/23 showed: Urodynamic Impression:  1. Sensation was normal; capacity was normal at 2. Stress Incontinence was demonstrated at normal pressures; 3. 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 4. Emptying was normal with a normal PVR, a sustained detrusor contraction present,  abdominal straining not present, dyssynergic urethral sphincter activity on EMG.    Past Medical History:  Diagnosis Date   Cancer (HCC)    skin   Graves disease    Hypertension    Thyroid disease      Past Surgical History:  Procedure Laterality Date   BREAST BIOPSY Right 2009   fibroadenoma   BUNIONECTOMY  1996   bilateral    has No Known Allergies.   Family History  Problem Relation Age of Onset   Cancer Mother        Breast Cancer   Diabetes Mother    Heart disease Mother    Hypertension Mother    Breast cancer Mother 97   Hypertension Father    Cancer Maternal Grandmother        Breast Cancer   Breast cancer Maternal Grandmother    Colon cancer Neg Hx    Colon polyps Neg Hx    Esophageal cancer Neg Hx    Rectal cancer Neg Hx    Stomach cancer Neg Hx     Social History   Tobacco Use   Smoking status: Former    Current packs/day: 0.00    Types: Cigarettes    Quit date: 12/13/2007    Years since quitting: 15.7   Smokeless tobacco: Never   Tobacco comments:    1-2 cigarettes once in a while  Vaping Use    Vaping status: Never Used  Substance Use Topics   Alcohol use: Not Currently   Drug use: No     Review of Systems was negative for a full 10 system review except as noted in the History of Present Illness.   Current Outpatient Medications:    chlorthalidone (HYGROTON) 25 MG tablet, Take 0.5 tablets (12.5 mg total) by mouth daily for fluid/hypertension, Disp: 45 tablet, Rfl: 0   losartan (COZAAR) 50 MG tablet, 1 tablet Orally Once a day, Disp: , Rfl:    losartan (COZAAR) 50 MG tablet, Take 1 tablet (50 mg total) by mouth daily., Disp: 90 tablet, Rfl: 0   methimazole (TAPAZOLE) 5 MG tablet, Take 1.5 tablets (7.5 mg total) by mouth daily with a meal, Disp: 135 tablet, Rfl: 3   phenazopyridine (PYRIDIUM) 95 MG tablet, Take 1 tablet (95 mg total) by mouth 3 (three) times daily as needed for pain., Disp: 12 tablet, Rfl: 1   Vibegron (GEMTESA) 75 MG TABS, Take 1 tablet (75 mg total) by mouth daily., Disp: 30 tablet, Rfl: 11   Vibegron 75 MG TABS, Take 1 tablet (75 mg total) by mouth daily., Disp: , Rfl:    Objective There were no vitals filed for this visit.  Gen: NAD CV: S1 S2 RRR Lungs: Clear  to auscultation bilaterally Abd: soft, nontender   Previous Pelvic Exam showed: stage III pelvic organ prolapse Repeat exam standing showed *** *** POP-Q                                               Aa                                               Ba                                                 C                                                Gh                                               Pb                                               tvl                                                Ap                                               Bp                                                 D      Assessment/ Plan  Assessment: The patient is a 57 y.o. year old scheduled to undergo ***. {desc; verbal/written:16408} consent was obtained for these procedures.  Plan: General  Surgical Consent: The patient has previously been counseled on alternative treatments, and the decision by the patient and provider was to proceed with the procedure listed above.  For all procedures, there are risks of bleeding, infection, damage to surrounding organs including but not limited to bowel, bladder, blood vessels, ureters and nerves, and need for further surgery if an injury were to occur. These risks are all low with minimally invasive surgery.   There are risks of numbness and weakness at any body site or buttock/rectal pain.  It is possible that baseline pain can be worsened by surgery, either with or without mesh. If surgery is vaginal, there is also a  low risk of possible conversion to laparoscopy or open abdominal incision where indicated. Very rare risks include blood transfusion, blood clot, heart attack, pneumonia, or death.   There is also a risk of short-term postoperative urinary retention with need to use a catheter. About half of patients need to go home from surgery with a catheter, which is then later removed in the office. The risk of long-term need for a catheter is very low. There is also a risk of worsening of overactive bladder.   ***Sling: The effectiveness of a midurethral vaginal mesh sling is approximately 85%, and thus, there will be times when you may leak urine after surgery, especially if your bladder is full or if you have a strong cough. There is a balance between making the sling tight enough to treat your leakage but not too tight so that you have long-term difficulty emptying your bladder. A mesh sling will not directly treat overactive bladder/urge incontinence and may worsen it.  There is an FDA safety notification on vaginal mesh procedures for prolapse but NOT mesh slings. We have extensive experience and training with mesh placement and we have close postoperative follow up to identify any potential complications from mesh. It is important to realize  that this mesh is a permanent implant that cannot be easily removed. There are rare risks of mesh exposure (2-4%), pain with intercourse (0-7%), and infection (<1%). The risk of mesh exposure if more likely in a woman with risks for poor healing (prior radiation, poorly controlled diabetes, or immunocompromised). The risk of new or worsened chronic pain after mesh implant is more common in women with baseline chronic pain and/or poorly controlled anxiety or depression. Approximately 2-4% of patients will experience longer-term post-operative voiding dysfunction that may require surgical revision of the sling. We also reviewed that postoperatively, her stream may not be as strong as before surgery.   *** Prolapse (with or without mesh): Risk factors for surgical failure  include things that put pressure on your pelvis and the surgical repair, including obesity, chronic cough, and heavy lifting or straining (including lifting children or adults, straining on the toilet, or lifting heavy objects such as furniture or anything weighing >25 lbs. Risks of recurrence is 20-30% with vaginal native tissue repair and a less than 10% with sacrocolpopexy with mesh.    ***Sacrocolpopexy: Mesh implants may provide more prolapse support, but do have some unique risks to consider. It is important to understand that mesh is permanent and cannot be easily removed. Risks of abdominal sacrocolpopexy mesh include mesh exposure (~3-6%), painful intercourse (recent studies show lower rates after surgery compared to before, with ~5-8% risk of new onset), and very rare risks of bowel or bladder injury or infection (<1%). The risk of mesh exposure is more likely in a woman with risks for poor healing (prior radiation, poorly controlled diabetes, or immunocompromised). The risk of new or worsened chronic pain after mesh implant is more common in women with baseline chronic pain and/or poorly controlled anxiety or depression. There is an  FDA safety notification on vaginal mesh procedures for prolapse but NOT abdominal mesh procedures and therefore does not apply to your surgery. We have extensive experience and training with mesh placement and we have close postoperative follow up to identify any potential complications from mesh.    We discussed consent for blood products. Risks for blood transfusion include allergic reactions, other reactions that can affect different body organs and managed accordingly, transmission of infectious diseases such as  HIV or Hepatitis. However, the blood is screened. Patient consents for blood products.***  Pre-operative instructions:  She was instructed to not take Aspirin/NSAIDs x 7days prior to surgery. She may continue her 81mg  ASA.*** Antibiotic prophylaxis was ordered as indicated.  Catheter use: Patient will go home with foley if needed after post-operative voiding trial.  Post-operative instructions:  She was provided with specific post-operative instructions, including precautions and signs/symptoms for which we would recommend contacting us, in addition to daytime and after-hours contact phone numbers. This was provided on a handout.   Post-operative medications: ***Prescriptions for motrin, tylenol, miralax, and oxycodone were sent to her pharmacy. Discussed using ibuprofen and tylenol on a schedule to limit use of narcotics.   Laboratory testing:  We will check labs: ***. Day of surgery UPT***  Preoperative clearance:  She {does/does not:19886} require surgical clearance.    Post-operative follow-up:  A post-operative appointment will be made for 6 weeks from the date of surgery. If she needs a post-operative nurse visit for a voiding trial, that will be set up after she leaves the hospital.    Patient will call the clinic or use MyChart should anything change or any new issues arise.   Loleta Chance, MD   ***OR orders

## 2023-08-31 ENCOUNTER — Encounter: Payer: Self-pay | Admitting: Obstetrics

## 2023-08-31 ENCOUNTER — Ambulatory Visit: Payer: Managed Care, Other (non HMO) | Admitting: Obstetrics

## 2023-08-31 VITALS — BP 132/86 | HR 86

## 2023-08-31 DIAGNOSIS — K5900 Constipation, unspecified: Secondary | ICD-10-CM | POA: Diagnosis not present

## 2023-08-31 DIAGNOSIS — R350 Frequency of micturition: Secondary | ICD-10-CM | POA: Diagnosis not present

## 2023-08-31 DIAGNOSIS — N816 Rectocele: Secondary | ICD-10-CM | POA: Diagnosis not present

## 2023-08-31 DIAGNOSIS — N3946 Mixed incontinence: Secondary | ICD-10-CM | POA: Diagnosis not present

## 2023-08-31 NOTE — Patient Instructions (Addendum)
Continue miralax daily at reduced dose and start fiber supplementation until you have daily easy bowel movements without loose stool.   Continue Gemtesa for overactive bladder.   You will be contacted regarding day for surgery.   Please contact my office if you have any additional questions regarding your surgical options.

## 2023-09-01 ENCOUNTER — Other Ambulatory Visit (HOSPITAL_COMMUNITY): Payer: Self-pay

## 2023-10-20 ENCOUNTER — Other Ambulatory Visit (HOSPITAL_COMMUNITY): Payer: Self-pay

## 2023-10-20 ENCOUNTER — Other Ambulatory Visit: Payer: Self-pay

## 2023-10-20 MED ORDER — LOSARTAN POTASSIUM 50 MG PO TABS
50.0000 mg | ORAL_TABLET | Freq: Every day | ORAL | 0 refills | Status: DC
  Filled 2023-10-20: qty 90, 90d supply, fill #0

## 2023-10-20 MED ORDER — CHLORTHALIDONE 25 MG PO TABS
12.5000 mg | ORAL_TABLET | Freq: Every day | ORAL | 0 refills | Status: DC
  Filled 2023-10-20 (×2): qty 45, 90d supply, fill #0

## 2023-10-23 ENCOUNTER — Other Ambulatory Visit: Payer: Self-pay

## 2023-10-27 ENCOUNTER — Other Ambulatory Visit (HOSPITAL_COMMUNITY): Payer: Self-pay

## 2023-10-27 ENCOUNTER — Encounter: Payer: Self-pay | Admitting: Obstetrics and Gynecology

## 2023-10-27 ENCOUNTER — Other Ambulatory Visit: Payer: Self-pay

## 2023-10-27 ENCOUNTER — Ambulatory Visit (INDEPENDENT_AMBULATORY_CARE_PROVIDER_SITE_OTHER): Payer: Managed Care, Other (non HMO) | Admitting: Obstetrics and Gynecology

## 2023-10-27 VITALS — BP 128/63 | HR 72

## 2023-10-27 DIAGNOSIS — Z01818 Encounter for other preprocedural examination: Secondary | ICD-10-CM

## 2023-10-27 DIAGNOSIS — N3281 Overactive bladder: Secondary | ICD-10-CM

## 2023-10-27 DIAGNOSIS — N393 Stress incontinence (female) (male): Secondary | ICD-10-CM

## 2023-10-27 MED ORDER — OXYCODONE HCL 5 MG PO TABS
5.0000 mg | ORAL_TABLET | ORAL | 0 refills | Status: DC | PRN
Start: 2023-10-27 — End: 2024-05-17
  Filled 2023-10-27: qty 15, 3d supply, fill #0

## 2023-10-27 MED ORDER — POLYETHYLENE GLYCOL 3350 17 GM/SCOOP PO POWD
17.0000 g | Freq: Every day | ORAL | 0 refills | Status: DC
Start: 2023-10-27 — End: 2024-05-17
  Filled 2023-10-27: qty 238, 14d supply, fill #0

## 2023-10-27 MED ORDER — IBUPROFEN 600 MG PO TABS
600.0000 mg | ORAL_TABLET | Freq: Four times a day (QID) | ORAL | 0 refills | Status: DC | PRN
Start: 2023-10-27 — End: 2024-01-25
  Filled 2023-10-27: qty 30, 8d supply, fill #0

## 2023-10-27 MED ORDER — ACETAMINOPHEN 500 MG PO TABS
500.0000 mg | ORAL_TABLET | Freq: Four times a day (QID) | ORAL | 0 refills | Status: DC | PRN
Start: 2023-10-27 — End: 2024-01-25
  Filled 2023-10-27 (×2): qty 30, 8d supply, fill #0

## 2023-10-27 NOTE — Progress Notes (Signed)
[facility] Pre-Operative Exam  Subjective Chief Complaint: Samantha Peck presents for a preoperative encounter.   History of Present Illness: Samantha Peck is a 57 y.o. female who presents for preoperative visit.  She is scheduled to undergo Exam under anesthesia, Robotic assisted laparoscopic hysterectomy, bilateral salpingectomy, sacrocolpopexy, posterior colporrhaphy, perineorrhaphy, midurethral sling and cystourethroscopy  on 11/20/23.  Her symptoms include pelvic organ prolapse, and she was was found to have Stage I anterior, Stage III posterior, Stage I apical prolapse.   Urodynamics showed: 1. Sensation was normal; capacity was normal 2. Stress Incontinence was demonstrated at normal pressures; 3. 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 4. Emptying was normal with a normal PVR, a sustained detrusor contraction present,  abdominal straining not present, dyssynergic urethral sphincter activity on EMG.  Past Medical History:  Diagnosis Date   Cancer (HCC)    skin   Graves disease    Hypertension    Thyroid disease      Past Surgical History:  Procedure Laterality Date   BREAST BIOPSY Right 2009   fibroadenoma   BUNIONECTOMY  1996   bilateral    has No Known Allergies.   Family History  Problem Relation Age of Onset   Cancer Mother        Breast Cancer   Diabetes Mother    Heart disease Mother    Hypertension Mother    Breast cancer Mother 47   Hypertension Father    Cancer Maternal Grandmother        Breast Cancer   Breast cancer Maternal Grandmother    Colon cancer Neg Hx    Colon polyps Neg Hx    Esophageal cancer Neg Hx    Rectal cancer Neg Hx    Stomach cancer Neg Hx     Social History   Tobacco Use   Smoking status: Former    Current packs/day: 0.00    Types: Cigarettes    Quit date: 12/13/2007    Years since quitting: 15.8   Smokeless tobacco: Never   Tobacco comments:    1-2 cigarettes once in a while   Vaping Use   Vaping status: Never Used  Substance Use Topics   Alcohol use: Not Currently   Drug use: No     Review of Systems was negative for a full 10 system review except as noted in the History of Present Illness.   Current Outpatient Medications:    chlorthalidone (HYGROTON) 25 MG tablet, Take 0.5 tablets (12.5 mg total) by mouth daily for fluid/hypertension, Disp: 45 tablet, Rfl: 0   losartan (COZAAR) 50 MG tablet, 1 tablet Orally Once a day, Disp: , Rfl:    losartan (COZAAR) 50 MG tablet, Take 1 tablet (50 mg total) by mouth daily., Disp: 90 tablet, Rfl: 0   methimazole (TAPAZOLE) 5 MG tablet, Take 1.5 tablets (7.5 mg total) by mouth daily with a meal, Disp: 135 tablet, Rfl: 3   phenazopyridine (PYRIDIUM) 95 MG tablet, Take 1 tablet (95 mg total) by mouth 3 (three) times daily as needed for pain., Disp: 12 tablet, Rfl: 1   Vibegron (GEMTESA) 75 MG TABS, Take 1 tablet (75 mg total) by mouth daily., Disp: 30 tablet, Rfl: 11   Vibegron 75 MG TABS, Take 1 tablet (75 mg total) by mouth daily., Disp: , Rfl:    Objective Vitals:   10/27/23 1040  BP: 128/63  Pulse: 72    Gen: NAD CV: S1 S2 RRR Lungs: Clear to auscultation bilaterally Abd: soft,  nontender   Previous Pelvic Exam showed: tage III pelvic organ prolapse Repeat exam standing showed apical prolapse   POP-Q   -2                                            Aa   -2                                           Ba   -4                                              C    5                                            Gh   2                                            Pb   7                                            tvl    3                                            Ap   3                                            Bp   -5                                              D      Assessment/ Plan  Assessment: The patient is a 57 y.o. year old scheduled to undergo Exam under anesthesia, Robotic assisted  laparoscopic hysterectomy, bilateral salpingectomy, sacrocolpopexy, posterior colporrhaphy, perineorrhaphy, midurethral sling and cystourethroscopy. Verbal consent was obtained for these procedures.  Plan: General Surgical Consent: The patient has previously been counseled on alternative treatments, and the decision by the patient and provider was to proceed with the procedure listed above.  For all procedures, there are risks of bleeding, infection, damage to surrounding organs including but not limited to bowel, bladder, blood vessels, ureters and nerves, and need for further surgery if an injury were to occur. These risks are all low with minimally invasive surgery.   There are risks of numbness and weakness at any body site or buttock/rectal pain.  It is possible that baseline pain can be worsened by surgery, either with  or without mesh. If surgery is vaginal, there is also a low risk of possible conversion to laparoscopy or open abdominal incision where indicated. Very rare risks include blood transfusion, blood clot, heart attack, pneumonia, or death.   There is also a risk of short-term postoperative urinary retention with need to use a catheter. About half of patients need to go home from surgery with a catheter, which is then later removed in the office. The risk of long-term need for a catheter is very low. There is also a risk of worsening of overactive bladder.   Sling: The effectiveness of a midurethral vaginal mesh sling is approximately 85%, and thus, there will be times when you may leak urine after surgery, especially if your bladder is full or if you have a strong cough. There is a balance between making the sling tight enough to treat your leakage but not too tight so that you have long-term difficulty emptying your bladder. A mesh sling will not directly treat overactive bladder/urge incontinence and may worsen it.  There is an FDA safety notification on vaginal mesh procedures for  prolapse but NOT mesh slings. We have extensive experience and training with mesh placement and we have close postoperative follow up to identify any potential complications from mesh. It is important to realize that this mesh is a permanent implant that cannot be easily removed. There are rare risks of mesh exposure (2-4%), pain with intercourse (0-7%), and infection (<1%). The risk of mesh exposure if more likely in a woman with risks for poor healing (prior radiation, poorly controlled diabetes, or immunocompromised). The risk of new or worsened chronic pain after mesh implant is more common in women with baseline chronic pain and/or poorly controlled anxiety or depression. Approximately 2-4% of patients will experience longer-term post-operative voiding dysfunction that may require surgical revision of the sling. We also reviewed that postoperatively, her stream may not be as strong as before surgery.   Prolapse (with or without mesh): Risk factors for surgical failure  include things that put pressure on your pelvis and the surgical repair, including obesity, chronic cough, and heavy lifting or straining (including lifting children or adults, straining on the toilet, or lifting heavy objects such as furniture or anything weighing >25 lbs. Risks of recurrence is 20-30% with vaginal native tissue repair and a less than 10% with sacrocolpopexy with mesh.    Sacrocolpopexy: Mesh implants may provide more prolapse support, but do have some unique risks to consider. It is important to understand that mesh is permanent and cannot be easily removed. Risks of abdominal sacrocolpopexy mesh include mesh exposure (~3-6%), painful intercourse (recent studies show lower rates after surgery compared to before, with ~5-8% risk of new onset), and very rare risks of bowel or bladder injury or infection (<1%). The risk of mesh exposure is more likely in a woman with risks for poor healing (prior radiation, poorly controlled  diabetes, or immunocompromised). The risk of new or worsened chronic pain after mesh implant is more common in women with baseline chronic pain and/or poorly controlled anxiety or depression. There is an FDA safety notification on vaginal mesh procedures for prolapse but NOT abdominal mesh procedures and therefore does not apply to your surgery. We have extensive experience and training with mesh placement and we have close postoperative follow up to identify any potential complications from mesh.    We discussed consent for blood products. Risks for blood transfusion include allergic reactions, other reactions that can affect different body  organs and managed accordingly, transmission of infectious diseases such as HIV or Hepatitis. However, the blood is screened. Patient consents for blood products.  Pre-operative instructions:  She was instructed to not take Aspirin/NSAIDs x 7days prior to surgery.  Antibiotic prophylaxis was ordered as indicated.  Catheter use: Patient will go home with foley if needed after post-operative voiding trial.  Post-operative instructions:  She was provided with specific post-operative instructions, including precautions and signs/symptoms for which we would recommend contacting us, in addition to daytime and after-hours contact phone numbers. This was provided on a handout.   Post-operative medications: Prescriptions for motrin, tylenol, miralax, and oxycodone were sent to her pharmacy. Discussed using ibuprofen and tylenol on a schedule to limit use of narcotics.   Laboratory testing:  We will check labs: CBC and Type and Screen ordered  Preoperative clearance:  She does not require surgical clearance.    Post-operative follow-up:  A post-operative appointment will be made for 6 weeks from the date of surgery. If she needs a post-operative nurse visit for a voiding trial, that will be set up after she leaves the hospital.    Patient will call the clinic or use  MyChart should anything change or any new issues arise.   Selmer Dominion, NP

## 2023-10-27 NOTE — H&P (Signed)
[facility] H&P  Subjective Chief Complaint: [name] presents for a preoperative encounter.   History of Present Illness: Samantha Peck is a 57 y.o. female who presents for preoperative visit.  She is scheduled to undergo Exam under anesthesia, Robotic assisted laparoscopic hysterectomy, bilateral salpingectomy, sacrocolpopexy, posterior colporrhaphy, perineorrhaphy, midurethral sling and cystourethroscopy  on 11/20/23.  Her symptoms include pelvic organ prolapse, and she was was found to have Stage I anterior, Stage III posterior, Stage I apical prolapse.   Urodynamics showed: 1. Sensation was normal; capacity was normal 2. Stress Incontinence was demonstrated at normal pressures; 3. 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 4. Emptying was normal with a normal PVR, a sustained detrusor contraction present,  abdominal straining not present, dyssynergic urethral sphincter activity on EMG.  Past Medical History:  Diagnosis Date   Cancer (HCC)    skin   Graves disease    Hypertension    Thyroid disease      Past Surgical History:  Procedure Laterality Date   BREAST BIOPSY Right 2009   fibroadenoma   BUNIONECTOMY  1996   bilateral    has No Known Allergies.   Family History  Problem Relation Age of Onset   Cancer Mother        Breast Cancer   Diabetes Mother    Heart disease Mother    Hypertension Mother    Breast cancer Mother 36   Hypertension Father    Cancer Maternal Grandmother        Breast Cancer   Breast cancer Maternal Grandmother    Colon cancer Neg Hx    Colon polyps Neg Hx    Esophageal cancer Neg Hx    Rectal cancer Neg Hx    Stomach cancer Neg Hx     Social History   Tobacco Use   Smoking status: Former    Current packs/day: 0.00    Types: Cigarettes    Quit date: 12/13/2007    Years since quitting: 15.8   Smokeless tobacco: Never   Tobacco comments:    1-2 cigarettes once in a while  Vaping Use    Vaping status: Never Used  Substance Use Topics   Alcohol use: Not Currently   Drug use: No     Review of Systems was negative for a full 10 system review except as noted in the History of Present Illness.  No current facility-administered medications for this encounter.  Current Outpatient Medications:    acetaminophen (TYLENOL) 500 MG tablet, Take 1 tablet (500 mg total) by mouth every 6 (six) hours as needed (pain)., Disp: 30 tablet, Rfl: 0   chlorthalidone (HYGROTON) 25 MG tablet, Take 0.5 tablets (12.5 mg total) by mouth daily for fluid/hypertension, Disp: 45 tablet, Rfl: 0   ibuprofen (ADVIL) 600 MG tablet, Take 1 tablet (600 mg total) by mouth every 6 (six) hours as needed., Disp: 30 tablet, Rfl: 0   losartan (COZAAR) 50 MG tablet, 1 tablet Orally Once a day, Disp: , Rfl:    losartan (COZAAR) 50 MG tablet, Take 1 tablet (50 mg total) by mouth daily., Disp: 90 tablet, Rfl: 0   methimazole (TAPAZOLE) 5 MG tablet, Take 1.5 tablets (7.5 mg total) by mouth daily with a meal, Disp: 135 tablet, Rfl: 3   oxyCODONE (OXY IR/ROXICODONE) 5 MG immediate release tablet, Take 1 tablet (5 mg total) by mouth every 4 (four) hours as needed for severe pain (pain score 7-10)., Disp: 15 tablet, Rfl: 0   phenazopyridine (PYRIDIUM)  95 MG tablet, Take 1 tablet (95 mg total) by mouth 3 (three) times daily as needed for pain., Disp: 12 tablet, Rfl: 1   polyethylene glycol powder (GLYCOLAX/MIRALAX) 17 GM/SCOOP powder, Take 17 g by mouth daily. Drink 17g (1 scoop) dissolved in water per day., Disp: 255 g, Rfl: 0   Vibegron (GEMTESA) 75 MG TABS, Take 1 tablet (75 mg total) by mouth daily., Disp: 30 tablet, Rfl: 11   Vibegron 75 MG TABS, Take 1 tablet (75 mg total) by mouth daily., Disp: , Rfl:    Objective   Gen: NAD CV: S1 S2 RRR Lungs: Clear to auscultation bilaterally Abd: soft, nontender   Previous Pelvic Exam showed: tage III pelvic organ prolapse Repeat exam standing showed apical prolapse    POP-Q   -2                                            Aa   -2                                           Ba   -4                                              C    5                                            Gh   2                                            Pb   7                                            tvl    3                                            Ap   3                                            Bp   -5                                              D      Assessment/ Plan  Assessment: The patient is a 57 y.o. year old scheduled to undergo Exam under anesthesia, Robotic assisted laparoscopic hysterectomy, bilateral salpingectomy, sacrocolpopexy, posterior colporrhaphy, perineorrhaphy, midurethral sling and cystourethroscopy. Verbal consent  was obtained for these procedures.

## 2023-10-30 ENCOUNTER — Encounter: Payer: Self-pay | Admitting: Obstetrics and Gynecology

## 2023-10-30 NOTE — Progress Notes (Signed)
Samantha Peck is a 57 y.o. female called and notified of that FMLA paper were received by the UROGYN dept from [employer] Pt notified there will be a 10-14 day turn around for forms to be ready.  Pt notified  she will be contacted as soon as the from are ready and have been faxed.  Pre-op date: 10/27/2023 Surgery date: 11/20/2023 Post op date:01/01/2024

## 2023-11-03 ENCOUNTER — Other Ambulatory Visit (INDEPENDENT_AMBULATORY_CARE_PROVIDER_SITE_OTHER): Payer: Managed Care, Other (non HMO)

## 2023-11-03 ENCOUNTER — Other Ambulatory Visit: Payer: Self-pay | Admitting: Internal Medicine

## 2023-11-03 DIAGNOSIS — E0500 Thyrotoxicosis with diffuse goiter without thyrotoxic crisis or storm: Secondary | ICD-10-CM

## 2023-11-03 LAB — TSH: TSH: 2.3 u[IU]/mL (ref 0.35–5.50)

## 2023-11-03 LAB — T3, FREE: T3, Free: 4 pg/mL (ref 2.3–4.2)

## 2023-11-03 LAB — T4, FREE: Free T4: 0.72 ng/dL (ref 0.60–1.60)

## 2023-11-13 ENCOUNTER — Encounter (HOSPITAL_BASED_OUTPATIENT_CLINIC_OR_DEPARTMENT_OTHER): Payer: Self-pay | Admitting: Obstetrics

## 2023-11-13 NOTE — Progress Notes (Signed)
Your procedure is scheduled on :  Monday,  11-20-2023  Report to [facility] AT  __9:00_ AM.   Call this number if you have problems the morning of surgery  :[phone]. Any questions prior to surgery, call pre-op nurse, Nahun Kronberg:  [phone]   OUR ADDRESS IS [address].  WE ARE LOCATED IN [facility].  PLEASE BRING YOUR INSURANCE CARD AND PHOTO ID DAY OF SURGERY.                                     REMEMBER: Do not eat any solid food after midnight night before surgery.  You may have clear liquid diet from midnight night before surgery until 8:00 AM day of surgery.  No clear liquids after 8:00 AM day of surgery , nothing by mouth including no water, candy/ gum/ mints.  Please brush your teeth morning surgery and rinse you mouth out.   CLEAR LIQUID DIET  Allowed      Water                                                                   Coffee and tea, regular and decaf  (NO cream or milk products of any type, may sweeten , no honey)                       Carbonated beverages, regular and diet                                    Sports drinks like Gatorade _____________________________________________________________________     TAKE ONLY THESE MEDICATIONS MORNING OF SURGERY:   NONE                                      DO NOT WEAR JEWERLY/  METAL/  PIERCINGS (INCLUDING NO PLASTIC PIERCINGS) DO NOT WEAR LOTIONS, POWDERS, PERFUMES OR NAIL POLISH ON YOUR FINGERNAILS. TOENAIL POLISH IS OK TO WEAR. DO NOT SHAVE FOR 48 HOURS PRIOR TO DAY OF SURGERY.  CONTACTS, GLASSES, OR DENTURES MAY NOT BE WORN TO SURGERY.  REMEMBER: NO SMOKING, VAPING ,  DRUGS OR ALCOHOL FOR 24 HOURS BEFORE YOUR SURGERY.                                    [facility] IS NOT RESPONSIBLE  FOR ANY BELONGINGS.                                                                    Marland Kitchen           [facility] - Preparing for Surgery Before surgery, you can play an important  role.  Because skin is not sterile, your skin needs to be as free of germs as possible.  You can reduce the number of germs on your skin by washing with CHG (chlorahexidine gluconate) soap before surgery.  CHG is an antiseptic cleaner which kills germs and bonds with the skin to continue killing germs even after washing. Please DO NOT use if you have an allergy to CHG or antibacterial soaps.  If your skin becomes reddened/irritated stop using the CHG and inform your nurse when you arrive at Short Stay. Do not shave (including legs and underarms) for at least 48 hours prior to the first CHG shower.  You may shave your face/neck. Please follow these instructions carefully:  1.  Shower with CHG Soap the night before surgery and the  morning of Surgery.  2.  If you choose to wash your hair, wash your hair first as usual with your  normal  shampoo.  3.  After you shampoo, rinse your hair and body thoroughly to remove the  shampoo.                                        4.  Use CHG as you would any other liquid soap.  You can apply chg directly  to the skin and wash , chg soap provided, night before and morning of your surgery.  5.  Apply the CHG Soap to your body ONLY FROM THE NECK DOWN.   Do not use on face/ open                           Wound or open sores. Avoid contact with eyes, ears mouth and genitals (private parts).                       Wash face,  Genitals (private parts) with your normal soap.             6.  Wash thoroughly, paying special attention to the area where your surgery  will be performed.  7.  Thoroughly rinse your body with warm water from the neck down.  8.  DO NOT shower/wash with your normal soap after using and rinsing off  the CHG Soap.             9.  Pat yourself dry with a clean towel.            10.  Wear clean pajamas.            11.  Place clean sheets on your bed the night of your first shower and do not  sleep with pets. Day of Surgery : Do not apply any lotions/  powders the morning of surgery.  Please wear clean clothes to the hospital/surgery center.  IF YOU HAVE ANY SKIN IRRITATION OR PROBLEMS WITH THE SURGICAL SOAP, PLEASE GET A BAR OF GOLD DIAL SOAP AND SHOWER THE NIGHT BEFORE YOUR SURGERY AND THE MORNING OF YOUR SURGERY. PLEASE LET THE NURSE KNOW MORNING OF YOUR SURGERY IF YOU HAD ANY PROBLEMS WITH THE SURGICAL SOAP.   YOUR SURGEON MAY HAVE REQUESTED EXTENDED RECOVERY TIME AFTER YOUR SURGERY. IT COULD BE A  JUST A FEW HOURS  UP TO AN OVERNIGHT STAY.  YOUR SURGEON SHOULD HAVE DISCUSSED THIS WITH YOU PRIOR TO YOUR SURGERY. IN THE EVENT YOU NEED TO  STAY OVERNIGHT PLEASE REFER TO THE FOLLOWING GUIDELINES. YOU MAY HAVE UP TO 4 VISITORS  MAY VISIT IN THE EXTENDED RECOVERY ROOM UNTIL 800 PM ONLY.  ONE  VISITOR AGE 101 AND OVER MAY SPEND THE NIGHT AND MUST BE IN EXTENDED RECOVERY ROOM NO LATER THAN 800 PM . YOUR DISCHARGE TIME AFTER YOU SPEND THE NIGHT IS 900 AM THE MORNING AFTER YOUR SURGERY. YOU MAY PACK A SMALL OVERNIGHT BAG WITH TOILETRIES FOR YOUR OVERNIGHT STAY IF YOU WISH.  REGARDLESS OF IF YOU STAY OVER NIGHT OR ARE DISCHARGED THE SAME DAY YOU WILL BE REQUIRED TO HAVE A RESPONSIBLE ADULT (18 YRS OLD OR OLDER) STAY WITH YOU FOR AT LEAST THE FIRST 24 HOURS  YOUR PRESCRIPTION MEDICATIONS WILL BE PROVIDED DURING [facility] STAY.  ________________________________________________________________________

## 2023-11-13 NOTE — Progress Notes (Signed)
Spoke w/ via phone for pre-op interview--- pt Lab needs dos----  urine preg       Lab results------ pt has lab appt on 11-15-2023 @ 1000, getting CBC/ BMP/ T&S/ EKG COVID test -----patient states asymptomatic no test needed Arrive at -------  0900 on 11-20-2023 NPO after MN NO Solid Food.  Clear liquids from MN until--- 0800 Med rec completed Medications to take morning of surgery ----- none Diabetic medication ----- n/a Patient instructed no nail polish to be worn day of surgery Patient instructed to bring photo id and insurance card day of surgery Patient aware to have Driver (ride ) / caregiver    for 24 hours after surgery -  Patient Special Instructions -----  pt picking up bag with hibiclens soap with written instructions @ lab appointment .  Reviewed RCC and visitor instructions Pre-Op special Instructions ----- n/a Patient verbalized understanding of instructions that were given at this phone interview. Patient denies chest pain, sob, fever, cough at the interview.

## 2023-11-15 ENCOUNTER — Encounter (HOSPITAL_COMMUNITY)
Admission: RE | Admit: 2023-11-15 | Discharge: 2023-11-15 | Disposition: A | Discharge status: Home / Self Care | Payer: Managed Care, Other (non HMO) | Source: Ambulatory Visit | Attending: Obstetrics | Admitting: Obstetrics

## 2023-11-15 DIAGNOSIS — N816 Rectocele: Secondary | ICD-10-CM

## 2023-11-15 DIAGNOSIS — Z01818 Encounter for other preprocedural examination: Secondary | ICD-10-CM | POA: Insufficient documentation

## 2023-11-15 DIAGNOSIS — N186 End stage renal disease: Secondary | ICD-10-CM | POA: Diagnosis not present

## 2023-11-15 LAB — CBC
HCT: 43.3 % (ref 36.0–46.0)
Hemoglobin: 14.4 g/dL (ref 12.0–15.0)
MCH: 30.3 pg (ref 26.0–34.0)
MCHC: 33.3 g/dL (ref 30.0–36.0)
MCV: 91 fL (ref 80.0–100.0)
Platelets: 375 10*3/uL (ref 150–400)
RBC: 4.76 MIL/uL (ref 3.87–5.11)
RDW: 12.7 % (ref 11.5–15.5)
WBC: 7.6 10*3/uL (ref 4.0–10.5)
nRBC: 0 % (ref 0.0–0.2)

## 2023-11-15 LAB — BASIC METABOLIC PANEL
Anion gap: 10 (ref 5–15)
BUN: 13 mg/dL (ref 6–20)
CO2: 29 mmol/L (ref 22–32)
Calcium: 9.8 mg/dL (ref 8.9–10.3)
Chloride: 100 mmol/L (ref 98–111)
Creatinine, Ser: 0.74 mg/dL (ref 0.44–1.00)
GFR, Estimated: >60 mL/min (ref >60)
Glucose, Bld: 88 mg/dL (ref 70–99)
Potassium: 3.6 mmol/L (ref 3.5–5.1)
Sodium: 139 mmol/L (ref 135–145)

## 2023-11-20 ENCOUNTER — Encounter (HOSPITAL_BASED_OUTPATIENT_CLINIC_OR_DEPARTMENT_OTHER): Payer: Self-pay | Admitting: Obstetrics

## 2023-11-20 ENCOUNTER — Encounter (HOSPITAL_BASED_OUTPATIENT_CLINIC_OR_DEPARTMENT_OTHER)
Admission: RE | Disposition: A | Discharge status: Home / Self Care | Payer: Self-pay | Source: Home / Self Care | Attending: Obstetrics

## 2023-11-20 ENCOUNTER — Other Ambulatory Visit: Payer: Self-pay

## 2023-11-20 ENCOUNTER — Ambulatory Visit (HOSPITAL_BASED_OUTPATIENT_CLINIC_OR_DEPARTMENT_OTHER)
Admission: RE | Admit: 2023-11-20 | Discharge: 2023-11-21 | Disposition: A | Discharge status: Home / Self Care | Payer: Managed Care, Other (non HMO) | Attending: Obstetrics | Admitting: Obstetrics

## 2023-11-20 ENCOUNTER — Ambulatory Visit (HOSPITAL_BASED_OUTPATIENT_CLINIC_OR_DEPARTMENT_OTHER): Payer: Managed Care, Other (non HMO) | Admitting: Anesthesiology

## 2023-11-20 DIAGNOSIS — N816 Rectocele: Secondary | ICD-10-CM | POA: Diagnosis present

## 2023-11-20 DIAGNOSIS — N393 Stress incontinence (female) (male): Secondary | ICD-10-CM | POA: Diagnosis not present

## 2023-11-20 DIAGNOSIS — K5900 Constipation, unspecified: Secondary | ICD-10-CM | POA: Insufficient documentation

## 2023-11-20 DIAGNOSIS — N813 Complete uterovaginal prolapse: Secondary | ICD-10-CM | POA: Diagnosis present

## 2023-11-20 DIAGNOSIS — N736 Female pelvic peritoneal adhesions (postinfective): Secondary | ICD-10-CM | POA: Insufficient documentation

## 2023-11-20 DIAGNOSIS — Z87891 Personal history of nicotine dependence: Secondary | ICD-10-CM | POA: Insufficient documentation

## 2023-11-20 DIAGNOSIS — Z8249 Family history of ischemic heart disease and other diseases of the circulatory system: Secondary | ICD-10-CM | POA: Insufficient documentation

## 2023-11-20 DIAGNOSIS — N3946 Mixed incontinence: Secondary | ICD-10-CM | POA: Insufficient documentation

## 2023-11-20 DIAGNOSIS — Z79899 Other long term (current) drug therapy: Secondary | ICD-10-CM | POA: Diagnosis not present

## 2023-11-20 DIAGNOSIS — I10 Essential (primary) hypertension: Secondary | ICD-10-CM | POA: Insufficient documentation

## 2023-11-20 DIAGNOSIS — E0500 Thyrotoxicosis with diffuse goiter without thyrotoxic crisis or storm: Secondary | ICD-10-CM | POA: Diagnosis not present

## 2023-11-20 DIAGNOSIS — Z01818 Encounter for other preprocedural examination: Secondary | ICD-10-CM

## 2023-11-20 HISTORY — PX: RECTOCELE REPAIR: SHX761

## 2023-11-20 HISTORY — PX: LAPAROSCOPIC LYSIS OF ADHESIONS: SHX5905

## 2023-11-20 HISTORY — DX: Personal history of other malignant neoplasm of skin: Z85.828

## 2023-11-20 HISTORY — PX: XI ROBOTIC ASSISTED TOTAL HYSTERECTOMY WITH SACROCOLPOPEXY: SHX6825

## 2023-11-20 HISTORY — DX: Stress incontinence (female) (male): N39.3

## 2023-11-20 HISTORY — PX: CYSTOSCOPY: SHX5120

## 2023-11-20 HISTORY — DX: Female genital prolapse, unspecified: N81.9

## 2023-11-20 HISTORY — PX: BLADDER SUSPENSION: SHX72

## 2023-11-20 LAB — TYPE AND SCREEN
ABO/RH(D): O POS
Antibody Screen: NEGATIVE

## 2023-11-20 LAB — ABO/RH: ABO/RH(D): O POS

## 2023-11-20 SURGERY — HYSTERECTOMY, TOTAL, ROBOT-ASSISTED, WITH SACROCOLPOPEXY
Anesthesia: General | Site: Vagina

## 2023-11-20 MED ORDER — SODIUM CHLORIDE (PF) 0.9 % IJ SOLN
INTRAMUSCULAR | Status: DC | PRN
  Administered 2023-11-20: 10 mL

## 2023-11-20 MED ORDER — DEXAMETHASONE SODIUM PHOSPHATE 10 MG/ML IJ SOLN
INTRAMUSCULAR | Status: AC
  Filled 2023-11-20: qty 1

## 2023-11-20 MED ORDER — ROCURONIUM BROMIDE 10 MG/ML (PF) SYRINGE
PREFILLED_SYRINGE | INTRAVENOUS | Status: DC | PRN
  Administered 2023-11-20: 70 mg via INTRAVENOUS
  Administered 2023-11-20 (×2): 10 mg via INTRAVENOUS

## 2023-11-20 MED ORDER — ONDANSETRON HCL 4 MG/2ML IJ SOLN
INTRAMUSCULAR | Status: AC
  Filled 2023-11-20: qty 2

## 2023-11-20 MED ORDER — SODIUM CHLORIDE 0.9 % IR SOLN
Status: DC | PRN
  Administered 2023-11-20: 600 mL via INTRAVESICAL

## 2023-11-20 MED ORDER — LIDOCAINE 2% (20 MG/ML) 5 ML SYRINGE
INTRAMUSCULAR | Status: DC | PRN
  Administered 2023-11-20: 50 mg via INTRAVENOUS

## 2023-11-20 MED ORDER — DROPERIDOL 2.5 MG/ML IJ SOLN: 0.6250 mg | Freq: Once | INTRAMUSCULAR | Status: DC | PRN

## 2023-11-20 MED ORDER — ACETAMINOPHEN 500 MG PO TABS
1000.0000 mg | ORAL_TABLET | ORAL | Status: AC
  Administered 2023-11-20: 1000 mg via ORAL

## 2023-11-20 MED ORDER — ROCURONIUM BROMIDE 10 MG/ML (PF) SYRINGE
PREFILLED_SYRINGE | INTRAVENOUS | Status: AC
  Filled 2023-11-20: qty 10

## 2023-11-20 MED ORDER — CELECOXIB 200 MG PO CAPS
ORAL_CAPSULE | ORAL | Status: AC
  Filled 2023-11-20: qty 2

## 2023-11-20 MED ORDER — PROPOFOL 10 MG/ML IV BOLUS
INTRAVENOUS | Status: DC | PRN
  Administered 2023-11-20: 180 mg via INTRAVENOUS

## 2023-11-20 MED ORDER — PHENAZOPYRIDINE HCL 100 MG PO TABS
200.0000 mg | ORAL_TABLET | ORAL | Status: AC
  Administered 2023-11-20: 200 mg via ORAL

## 2023-11-20 MED ORDER — PROPOFOL 10 MG/ML IV BOLUS
INTRAVENOUS | Status: AC
  Filled 2023-11-20: qty 20

## 2023-11-20 MED ORDER — ACETAMINOPHEN 500 MG PO TABS
ORAL_TABLET | ORAL | Status: AC
  Filled 2023-11-20: qty 2

## 2023-11-20 MED ORDER — IBUPROFEN 200 MG PO TABS
ORAL_TABLET | ORAL | Status: AC
  Filled 2023-11-20: qty 3

## 2023-11-20 MED ORDER — SIMETHICONE 80 MG PO CHEW: 80.0000 mg | CHEWABLE_TABLET | Freq: Four times a day (QID) | ORAL | Status: DC | PRN

## 2023-11-20 MED ORDER — FENTANYL CITRATE (PF) 100 MCG/2ML IJ SOLN
INTRAMUSCULAR | Status: AC
  Filled 2023-11-20: qty 2

## 2023-11-20 MED ORDER — CEFAZOLIN SODIUM-DEXTROSE 2-4 GM/100ML-% IV SOLN
INTRAVENOUS | Status: AC
  Filled 2023-11-20: qty 100

## 2023-11-20 MED ORDER — ACETAMINOPHEN 500 MG PO TABS
1000.0000 mg | ORAL_TABLET | Freq: Four times a day (QID) | ORAL | Status: DC
  Administered 2023-11-20 – 2023-11-21 (×3): 1000 mg via ORAL

## 2023-11-20 MED ORDER — DEXMEDETOMIDINE HCL IN NACL 80 MCG/20ML IV SOLN
INTRAVENOUS | Status: DC | PRN
  Administered 2023-11-20: 8 ug via INTRAVENOUS
  Administered 2023-11-20: 4 ug via INTRAVENOUS
  Administered 2023-11-20: 8 ug via INTRAVENOUS
  Administered 2023-11-20: 4 ug via INTRAVENOUS

## 2023-11-20 MED ORDER — OXYCODONE HCL 5 MG PO TABS: 5.0000 mg | ORAL_TABLET | Freq: Once | ORAL | Status: DC | PRN

## 2023-11-20 MED ORDER — CEFAZOLIN SODIUM-DEXTROSE 2-4 GM/100ML-% IV SOLN
2.0000 g | INTRAVENOUS | Status: AC
  Administered 2023-11-20 (×2): 2 g via INTRAVENOUS

## 2023-11-20 MED ORDER — METRONIDAZOLE 500 MG/100ML IV SOLN
500.0000 mg | Freq: Once | INTRAVENOUS | Status: AC
  Administered 2023-11-20: 500 mg via INTRAVENOUS

## 2023-11-20 MED ORDER — ONDANSETRON HCL 4 MG PO TABS: 4.0000 mg | ORAL_TABLET | Freq: Four times a day (QID) | ORAL | Status: DC | PRN

## 2023-11-20 MED ORDER — DEXAMETHASONE SODIUM PHOSPHATE 10 MG/ML IJ SOLN
INTRAMUSCULAR | Status: DC | PRN
  Administered 2023-11-20 (×2): 5 mg via INTRAVENOUS

## 2023-11-20 MED ORDER — CEFAZOLIN SODIUM 1 G IJ SOLR
INTRAMUSCULAR | Status: AC
  Filled 2023-11-20: qty 20

## 2023-11-20 MED ORDER — ONDANSETRON HCL 4 MG/2ML IJ SOLN
INTRAMUSCULAR | Status: DC | PRN
  Administered 2023-11-20: 4 mg via INTRAVENOUS

## 2023-11-20 MED ORDER — GABAPENTIN 300 MG PO CAPS
ORAL_CAPSULE | ORAL | Status: AC
  Filled 2023-11-20: qty 1

## 2023-11-20 MED ORDER — HYDROMORPHONE HCL 1 MG/ML IJ SOLN
INTRAMUSCULAR | Status: AC
  Filled 2023-11-20: qty 1

## 2023-11-20 MED ORDER — SCOPOLAMINE 1 MG/3DAYS TD PT72
1.0000 | MEDICATED_PATCH | TRANSDERMAL | Status: DC
  Administered 2023-11-20: 1.5 mg via TRANSDERMAL

## 2023-11-20 MED ORDER — KETOROLAC TROMETHAMINE 30 MG/ML IJ SOLN: 30.0000 mg | INTRAMUSCULAR | Status: AC

## 2023-11-20 MED ORDER — OXYCODONE HCL 5 MG/5ML PO SOLN: 5.0000 mg | Freq: Once | ORAL | Status: DC | PRN

## 2023-11-20 MED ORDER — LIDOCAINE-EPINEPHRINE 1 %-1:100000 IJ SOLN
INTRAMUSCULAR | Status: DC | PRN
  Administered 2023-11-20: 20 mL
  Administered 2023-11-20: 16 mL

## 2023-11-20 MED ORDER — PHENAZOPYRIDINE HCL 100 MG PO TABS
ORAL_TABLET | ORAL | Status: AC
  Filled 2023-11-20: qty 2

## 2023-11-20 MED ORDER — POVIDONE-IODINE 10 % EX SWAB: 2.0000 | Freq: Once | CUTANEOUS | Status: DC

## 2023-11-20 MED ORDER — SODIUM CHLORIDE 0.9 % IV SOLN: INTRAVENOUS | Status: AC

## 2023-11-20 MED ORDER — MIDAZOLAM HCL 2 MG/2ML IJ SOLN
INTRAMUSCULAR | Status: AC
  Filled 2023-11-20: qty 2

## 2023-11-20 MED ORDER — EPHEDRINE SULFATE-NACL 50-0.9 MG/10ML-% IV SOSY
PREFILLED_SYRINGE | INTRAVENOUS | Status: DC | PRN
  Administered 2023-11-20: 10 mg via INTRAVENOUS
  Administered 2023-11-20: 5 mg via INTRAVENOUS

## 2023-11-20 MED ORDER — MIDAZOLAM HCL 2 MG/2ML IJ SOLN
INTRAMUSCULAR | Status: DC | PRN
  Administered 2023-11-20: 2 mg via INTRAVENOUS

## 2023-11-20 MED ORDER — GABAPENTIN 100 MG PO CAPS
100.0000 mg | ORAL_CAPSULE | Freq: Two times a day (BID) | ORAL | Status: DC
  Administered 2023-11-20: 100 mg via ORAL

## 2023-11-20 MED ORDER — CELECOXIB 200 MG PO CAPS
400.0000 mg | ORAL_CAPSULE | ORAL | Status: AC
  Administered 2023-11-20: 400 mg via ORAL

## 2023-11-20 MED ORDER — GABAPENTIN 100 MG PO CAPS
ORAL_CAPSULE | ORAL | Status: AC
  Filled 2023-11-20: qty 1

## 2023-11-20 MED ORDER — OXYCODONE HCL 5 MG PO TABS
ORAL_TABLET | ORAL | Status: AC
  Filled 2023-11-20: qty 1

## 2023-11-20 MED ORDER — HYDROMORPHONE HCL 1 MG/ML IJ SOLN
0.2500 mg | INTRAMUSCULAR | Status: DC | PRN
  Administered 2023-11-20 (×2): 0.5 mg via INTRAVENOUS

## 2023-11-20 MED ORDER — EPHEDRINE 5 MG/ML INJ
INTRAVENOUS | Status: AC
  Filled 2023-11-20: qty 5

## 2023-11-20 MED ORDER — METRONIDAZOLE 500 MG/100ML IV SOLN
INTRAVENOUS | Status: AC
  Filled 2023-11-20: qty 100

## 2023-11-20 MED ORDER — IBUPROFEN 200 MG PO TABS
600.0000 mg | ORAL_TABLET | Freq: Four times a day (QID) | ORAL | Status: DC
  Administered 2023-11-20 – 2023-11-21 (×3): 600 mg via ORAL

## 2023-11-20 MED ORDER — GABAPENTIN 300 MG PO CAPS
300.0000 mg | ORAL_CAPSULE | ORAL | Status: AC
  Administered 2023-11-20: 300 mg via ORAL

## 2023-11-20 MED ORDER — ONDANSETRON HCL 4 MG/2ML IJ SOLN: 4.0000 mg | Freq: Four times a day (QID) | INTRAMUSCULAR | Status: DC | PRN

## 2023-11-20 MED ORDER — POLYETHYLENE GLYCOL 3350 17 G PO PACK: 17.0000 g | PACK | Freq: Every day | ORAL | Status: DC | PRN

## 2023-11-20 MED ORDER — OXYCODONE HCL 5 MG PO TABS
5.0000 mg | ORAL_TABLET | ORAL | Status: DC | PRN
  Administered 2023-11-20 – 2023-11-21 (×3): 5 mg via ORAL

## 2023-11-20 MED ORDER — STERILE WATER FOR IRRIGATION IR SOLN
Status: DC | PRN
  Administered 2023-11-20: 500 mL

## 2023-11-20 MED ORDER — LIDOCAINE HCL (PF) 2 % IJ SOLN
INTRAMUSCULAR | Status: AC
  Filled 2023-11-20: qty 5

## 2023-11-20 MED ORDER — SUGAMMADEX SODIUM 200 MG/2ML IV SOLN
INTRAVENOUS | Status: DC | PRN
  Administered 2023-11-20: 170 mg via INTRAVENOUS

## 2023-11-20 MED ORDER — ONDANSETRON HCL 4 MG/2ML IJ SOLN
4.0000 mg | Freq: Once | INTRAMUSCULAR | Status: AC | PRN
  Administered 2023-11-20: 4 mg via INTRAVENOUS

## 2023-11-20 MED ORDER — FENTANYL CITRATE (PF) 100 MCG/2ML IJ SOLN
INTRAMUSCULAR | Status: DC | PRN
  Administered 2023-11-20: 100 ug via INTRAVENOUS

## 2023-11-20 MED ORDER — SCOPOLAMINE 1 MG/3DAYS TD PT72
MEDICATED_PATCH | TRANSDERMAL | Status: AC
  Filled 2023-11-20: qty 1

## 2023-11-20 SURGICAL SUPPLY — 91 items
BAG URINE DRAIN 2000ML AR STRL (UROLOGICAL SUPPLIES) IMPLANT
BLADE CLIPPER SENSICLIP SURGIC (BLADE) IMPLANT
BLADE SURG 15 STRL LF DISP TIS (BLADE) ×3 IMPLANT
CATH FOLEY 3WAY 5CC 16FR (CATHETERS) ×3 IMPLANT
CHLORAPREP W/TINT 26 (MISCELLANEOUS) ×3 IMPLANT
COVER BACK TABLE 60X90IN (DRAPES) ×3 IMPLANT
COVER TIP SHEARS 8 DVNC (MISCELLANEOUS) ×3 IMPLANT
DEFOGGER SCOPE WARMER CLEARIFY (MISCELLANEOUS) ×3 IMPLANT
DERMABOND ADVANCED .7 DNX12 (GAUZE/BANDAGES/DRESSINGS) ×3 IMPLANT
DEVICE CAPIO SLIM SINGLE (INSTRUMENTS) IMPLANT
DRAPE ARM DVNC X/XI (DISPOSABLE) ×12 IMPLANT
DRAPE COLUMN DVNC XI (DISPOSABLE) ×3 IMPLANT
DRAPE SHEET LG 3/4 BI-LAMINATE (DRAPES) ×3 IMPLANT
DRAPE SURG IRRIG POUCH 19X23 (DRAPES) ×3 IMPLANT
DRAPE UTILITY XL STRL (DRAPES) ×3 IMPLANT
DRIVER NDL LRG 8 DVNC XI (INSTRUMENTS) ×3 IMPLANT
DRIVER NDL MEGA SUTCUT DVNCXI (INSTRUMENTS) ×3 IMPLANT
DRIVER NDLE LRG 8 DVNC XI (INSTRUMENTS) ×3 IMPLANT
DRIVER NDLE MEGA SUTCUT DVNCXI (INSTRUMENTS) ×3 IMPLANT
ELECT REM PT RETURN 9FT ADLT (ELECTROSURGICAL) ×3 IMPLANT
ELECTRODE REM PT RTRN 9FT ADLT (ELECTROSURGICAL) ×3 IMPLANT
FORCEPS BPLR 8 MD DVNC XI (FORCEP) ×3 IMPLANT
GAUZE 4X4 16PLY ~~LOC~~+RFID DBL (SPONGE) IMPLANT
GLOVE BIOGEL PI IND STRL 6.5 (GLOVE) ×12 IMPLANT
GLOVE BIOGEL PI IND STRL 7.0 (GLOVE) ×3 IMPLANT
GLOVE ECLIPSE 6.0 STRL STRAW (GLOVE) ×9 IMPLANT
GOWN STRL REUS W/TWL LRG LVL3 (GOWN DISPOSABLE) ×3 IMPLANT
GRASPER TIP-UP FEN DVNC XI (INSTRUMENTS) ×3 IMPLANT
HOLDER FOLEY CATH W/STRAP (MISCELLANEOUS) ×3 IMPLANT
IRRIG SUCT STRYKERFLOW 2 WTIP (MISCELLANEOUS) ×3 IMPLANT
IRRIGATION SUCT STRKRFLW 2 WTP (MISCELLANEOUS) ×3 IMPLANT
IV NS 1000ML BAXH (IV SOLUTION) IMPLANT
KIT PINK PAD W/HEAD ARE REST (MISCELLANEOUS) ×3 IMPLANT
KIT PINK PAD W/HEAD ARM REST (MISCELLANEOUS) ×3 IMPLANT
KIT TURNOVER CYSTO (KITS) ×3 IMPLANT
LEGGING LITHOTOMY PAIR STRL (DRAPES) ×3 IMPLANT
MANIFOLD NEPTUNE II (INSTRUMENTS) ×3 IMPLANT
MANIPULATOR ADVINCU DEL 2.5 PL (MISCELLANEOUS) IMPLANT
MANIPULATOR ADVINCU DEL 3.0 PL (MISCELLANEOUS) IMPLANT
MANIPULATOR ADVINCU DEL 3.5 PL (MISCELLANEOUS) IMPLANT
MANIPULATOR ADVINCU DEL 4.0 PL (MISCELLANEOUS) IMPLANT
MARKER SKIN DUAL TIP RULER LAB (MISCELLANEOUS) IMPLANT
MESH VERTESSA LITE -Y 2X4X3 (Mesh General) IMPLANT
NDL HYPO 22X1.5 SAFETY MO (MISCELLANEOUS) ×3 IMPLANT
NDL INSUFFLATION 14GA 120MM (NEEDLE) ×3 IMPLANT
NDL MAYO 6 CRC TAPER PT (NEEDLE) IMPLANT
NEEDLE HYPO 22X1.5 SAFETY MO (MISCELLANEOUS) ×3 IMPLANT
NEEDLE INSUFFLATION 14GA 120MM (NEEDLE) ×3 IMPLANT
NEEDLE MAYO 6 CRC TAPER PT (NEEDLE) IMPLANT
NS IRRIG 1000ML POUR BTL (IV SOLUTION) ×3 IMPLANT
OBTURATOR OPTICAL STND 8 DVNC (TROCAR) ×3 IMPLANT
OBTURATOR OPTICALSTD 8 DVNC (TROCAR) ×3 IMPLANT
PACK CYSTO (CUSTOM PROCEDURE TRAY) ×3 IMPLANT
PACK ROBOT WH (CUSTOM PROCEDURE TRAY) ×3 IMPLANT
PACK ROBOTIC GOWN (GOWN DISPOSABLE) ×3 IMPLANT
PACK VAGINAL WOMENS (CUSTOM PROCEDURE TRAY) ×3 IMPLANT
PAD OB MATERNITY 4.3X12.25 (PERSONAL CARE ITEMS) ×3 IMPLANT
PAD PREP 24X48 CUFFED NSTRL (MISCELLANEOUS) ×3 IMPLANT
POUCH LAPAROSCOPIC INSTRUMENT (MISCELLANEOUS) IMPLANT
PROTECTOR NERVE ULNAR (MISCELLANEOUS) ×3 IMPLANT
RETRACTOR LONE STAR DISPOSABLE (INSTRUMENTS) ×3 IMPLANT
RETRACTOR STAY HOOK 5MM (MISCELLANEOUS) ×3 IMPLANT
SCISSORS LAP 5X45 EPIX DISP (ENDOMECHANICALS) IMPLANT
SCISSORS MNPLR CVD DVNC XI (INSTRUMENTS) ×3 IMPLANT
SCRUB CHG 4% DYNA-HEX 4OZ (MISCELLANEOUS) ×3 IMPLANT
SEAL UNIV 5-12 XI (MISCELLANEOUS) ×15 IMPLANT
SEALER VESSEL EXT DVNC XI (MISCELLANEOUS) IMPLANT
SET IRRIG Y TYPE TUR BLADDER L (SET/KITS/TRAYS/PACK) ×3 IMPLANT
SET TUBE SMOKE EVAC HIGH FLOW (TUBING) ×3 IMPLANT
SLEEVE SCD COMPRESS KNEE MED (STOCKING) ×3 IMPLANT
SLEEVE SURGEON STRL (DRAPES) IMPLANT
SLING ADVANTAGE FIT TRANVAG (Sling) IMPLANT
SPIKE FLUID TRANSFER (MISCELLANEOUS) ×6 IMPLANT
SURGIFLO W/THROMBIN 8M KIT (HEMOSTASIS) IMPLANT
SUT ABS MONO DBL WITH NDL 48IN (SUTURE) IMPLANT
SUT GORETEX NAB #0 THX26 36IN (SUTURE) ×3 IMPLANT
SUT MNCRL AB 4-0 PS2 18 (SUTURE) ×3 IMPLANT
SUT MON AB 2-0 SH 27 (SUTURE) ×3 IMPLANT
SUT PDS AB 2-0 CT2 27 (SUTURE) ×6 IMPLANT
SUT V-LOC BARB 180 2/0GR9 GS23 (SUTURE) ×9 IMPLANT
SUT VIC AB 0 CT1 27XBRD ANBCTR (SUTURE) IMPLANT
SUT VIC AB 0 CT1 27XBRD ANTBC (SUTURE) IMPLANT
SUT VIC AB 2-0 SH 27XBRD (SUTURE) IMPLANT
SUT VICRYL 2-0 SH 8X27 (SUTURE) IMPLANT
SUT VLOC 180 0 9IN GS21 (SUTURE) IMPLANT
SUT VLOC 180 2-0 9IN GS21 (SUTURE) IMPLANT
SUTURE V-LC BRB 180 2/0GR9GS23 (SUTURE) ×6 IMPLANT
SYR BULB EAR ULCER 3OZ GRN STR (SYRINGE) ×3 IMPLANT
TOWEL OR 17X24 6PK STRL BLUE (TOWEL DISPOSABLE) ×3 IMPLANT
TRAY FOLEY W/BAG SLVR 14FR LF (SET/KITS/TRAYS/PACK) ×3 IMPLANT
WATER STERILE IRR 500ML POUR (IV SOLUTION) IMPLANT

## 2023-11-20 NOTE — Op Note (Signed)
Operative Note  Preoperative Diagnosis:  Stage III pelvic organ prolapse, mixed urinary incontinence, constipation  Postoperative Diagnosis: : Stage III pelvic organ prolapse, stress urinary incontinence, constipation, pelvic adhesions  Procedures performed:  Robotic assisted laparoscopic lysis of adhesion, hysterectomy, sacrocolpopexy, bilateral salpingectomy, posterior repair, perineorrhaphy, midurethral sling.   Implants:  Implant Name Type Inv. Item Serial No. Manufacturer Lot No. LRB No. Used Action  MESH [employer] [id number] M[employer] MESH [employer] [phone]  [facility] MEDICAL [id number] N/A 1 Implanted  [employer] [id number] [employer] Regina Eck [id number] N/A 1 Implanted    Attending Surgeon: Madaline Brilliant, MD  Assistant Surgeon: Lanetta Inch, MD  @APPASSISTANT @  Anesthesia: General endotracheal  Findings: Stage III pelvic organ prolapse noted on exam. Thin omental adhesions were noted in the right upper quadrant at laparoscopic port placement site, careful dissection using monopolar scissors was used to improve visualization. On Cystoscopy normal urethral mucosa without injury, perforation, mesh or foreign material noted.  She had normal bladder mucosa. She had bilateral clear efflux from both ureteral orifices.  She had no squamous metaplasia at the trigone, no trabeculations, cellules or diverticuli.    Specimens:  ID Type Source Tests Collected by Time Destination  1 : Uterus, cervix, bilateral fallopian tubes Tissue PATH Gyn benign resection SURGICAL PATHOLOGY Wyatt Haste T, MD 11/20/2023 1315     Estimated blood loss: 25 mL  IV fluids: 800 mL  Urine output: 300 mL orange tinged urine  Complications: none  Procedure in Detail:   After informed consent was obtained, the patient was taken to the operating room, where general anesthesia was induced and found to be adequate. She was placed in  dorsolithotomy position in yellowfin stirrups. Her hips were noted not to be hyperflexed or hyperextended. Her arms were padded with gel pads and tucked to her sides. Her hands were surrounded by foam. A padded strap was placed across her chest with foam between the pad and her skin. She was noted to be appropriately positioned with all pressure points well padded and off tension. A tilt test showed no slippage. She was prepped and draped in the usual sterile fashion. A uterine manipulator was placed in the uterus after sounding to 5 cm and a 0-vicryl suture was placed at the anterior lip of the cervix or countertraction, an appropriately sized Koh ring was placed around the cervix, and a pneumo-occluder balloon was positioned in the vagina for later use.  A sterile Foley catheter was inserted.   A total of 36mL 1% lidocaine with epinephrine was used. A total of 10mL was injected in the supraumbilical area and an 8 mm supraumbilical skin incision was made with the scalpel.  A Veress needle was inserted into the incision, CO2 insufflation was started, a low opening pressure was noted, and pneumoperitoneum was obtained. The Veress needle was removed and a 8mm robotic trocar was placed. The robotic camera was inserted and intraperitoneal placement was confirmed. Survey of the abdomen and pelvis revealed the findings as noted above, specifically thin omental adhesions in the right upper quadrant at the planned laparoscopic port placement sites. After determining placement for the other ports, Local anesthetic was injected at each site and an 8 mm incision was made for a robotic port at 10 cm lateral to and at the level of the umbilical port on the left. An additional 8 mm incision was made 10 cm lateral to and 30 degrees down  followed by 8 mm robotic port. A laparoscopic monopolar scissors were used to carefully free the adhesion from the abdominal wall using sharp and blunt dissection with Dr. Florian Buff. The  dissection site and omental was noted to be hemostatic.  An additional 8 mm incision was made 10 cm lateral to these and 30 degrees down followed by 8 mm robotic ports - the right side for an assistant port. All trocars were placed sequentially under direct visualization of the camera. The patient was placed in Trendelenburg. The robot was docked on the patient's right side. Monopolar endoshears were placed in the right arm, a [place] bipolar grasper was placed in the 2nd arm of the patient's left side, and a Tip up grasper was placed in the 3rd arm on the patient's left side.   Attention was then turned to the sacral promontory. The sacrum appeared to be free of any adhesive disease with a thick layer of presacral adipose tissue which limited visualization of the sacral promontory. The location of sacral promontory was confirmed by tactile feedback. The peritoneum overlying the sacral promontory was tented up, dissected sharply with monopolar scissors and electrosurgery using layer by layer technique. The peritoneal incision was extended down towards the posterior cul-de-sac to improve visualization. This was performed with care to avoid the ureter on the right side and the sigmoid colon and its mesentary on the left side. Two transverse sutures of CV2 Gortex were placed in the anterior longitudinal ligament.  Attention was then turned to the robotic hysterectomy and bilateral salpingectomy. The ureter was identified and was found to be well away from the planned site of incision. Using the monopolar scissors, a window was made on the posterior leaf of the broad ligament. The mesosalpinx was cauterized and transected. The right round ligament was grasped, cauterized, and transected with electrocautery. The anterior and posterior leaves of the broad ligament were taken down with cautery and sharp dissection. The uterine artery was skeletonized and the bladder flap was created on the right side with a combination  of electrosurgery and sharp dissection. The KOH ring was identified. The right uterine artery was clamped, cauterized, and transected. In a similar fashion, the left side was taken down. Further sharp dissection with combination of cautery was performed to further develop the bladder flap. At this point, the KOH ring was completely hugging the cervix. The pneumo-occluder balloon in the vagina was inflated to maintain pneumoperitoneum. A colpotomy was performed with electrosurgical cutting current and the uterus and cervix were completely amputated from the vagina. The specimen was delivered through the vagina. The posterior portion of the vaginal cuff was then grasped and pulled up to maintain pneumoperitoneum. The pneumo-occluder balloon was then replaced in the vagina. The right hand instrument was changed to a suture-cut needle driver. The vaginal cuff was then closed using a 0 V-lock suture in two layers.    The right hand instrument was replaced with monopolar endoshears. With a lucite probe in the vagina, the anterior vaginal dissection was then performed with sharp dissection and electrosurgery. The posterior vaginal dissection was then performed with sharp dissection and electrosurgery in order to dissect the rectum away from the posterior vagina.  A "Y" mesh was then inserted into the abdomen after trimming to appropriate size. With the probe in the vagina, the anterior leaf of the Y mesh was affixed to the anterior portion of the vagina using a 2-0 v-loc suture in a spiral pattern to distribute the suture evenly across the surface  of the anterior mesh leaf. In a similar fashion, the posterior leaf of the Y mesh was attached to the posterior surface of the vagina with 2-0 v-loc suture.  The distal end of the mesh was then brought to overlie the sacrum. The correct amount of tension was determined in order to elevate the vagina, but not put the mesh under tension. The distal end of the mesh was then  affixed to the anterior longitudinal sacral ligament using two interrupted transverse stitches of CV2 Gortex. The excess distal mesh was then cut and removed. The peritoneum was reapproximated over the mesh using 2-0 monocryl. The bladder flap was incorporated to completely retroperitonealize the mesh. All pedicles were carefully inspected and noted to be hemostatic as the CO2 gas was deflated. All instruments were removed from the patient's abdomen.   The Foley catheter was removed.  A 70-degree cystoscope was introduced, and 360-degree inspection revealed no injury, lesion or foreign body in the bladder. Brisk bilateral ureteral efflux was noted with the assistance of pyridium.  The bladder was drained and the cystoscope was removed.  The Foley catheter was replaced.  The robot was undocked. The CO2 gas was removed and the ports were removed.  The skin incisions were closed with subcutaneous stitches of 4-0 Monocryl and covered with skin glue.    The mid urethral area was located on the anterior vaginal wall.  Two Allis clamps were placed at the level of the midurethra. 1% lidocaine with epinephrine was injected into the vaginal mucosa and suprapubic region marked 2cm lateral to midline. A vertical incision was made between the two clamps using a 15-blade scalpel.  Using sharp dissection, Metzenbaum scissors were used to make a periurethral tunnel from the vaginal incision towards the pubic rami bilaterally for the future sling tracts. The bladder was ensured to be empty. The trocar and attached sling were introduced into the right side of the periurethral vaginal incision, just inferior to the pubic symphysis on the right side. The trocar was guided through the endopelvic fascia and directly vertically.  While hugging the cephalad surface of the pubic bone, the trocar was guided out through the abdomen 2 fingerbreadths lateral to midline at the level of the pubic symphysis on the ipsilateral side. The  trocar was placed on the left side in a similar fashion.  A 70-degree cystoscope was introduced, and 360-degree inspection revealed no trauma or trocars in the bladder, with brisk bilateral ureteral efflux.  The sling was brought to lie beneath the mid-urethra with a loop held by an Allis clamp.  A needle driver was placed behind the sling to ensure no tension.   The plastic sheath was removed from the sling and the distal ends of the sling were trimmed just below the level of the skin incisions.  Tension-free positioning of the sling was confirmed. A Crede maneuver was performed with no urine leakage noted from the urethral meatus. Vaginal inspection revealed no vaginotomy or sling perforations of the mucosa.  The vaginal mucosal edges were reapproximated using 2-0 Vicryl.  The vagina was copiously irrigated.  Hemostasis was again noted. The suprapubic sling incisions were closed with Dermabond. The Foley catheter was reinserted.    Attention was then turned to the posterior vagina.  Two Allis clamps were placed at the introitus approximately 3cm from the urethra meatus. Injectable saline was injected into the vaginal mucosa in the posterior vaginal wall and perineum for hydrodissection. An incision was made between these clamps with a 15  blade scalpel and a diamond shaped area of epithelium was dissected at the introitus.  The rectovaginal septum was then dissected off the vaginal mucosa bilaterally. The rectovaginal septum was then plicated in a continuous running fashion with 2-0 PDS while one finger was placed in the rectum to prevent rectal penetration.  After placement of the first plication stitch two fingers were inserted into the vaginal to confirm adequate caliber.  The suture incorporated the perineal body in a U stitch fashion and the bulbocavernosus muscles. A 2-0 Vicryl was used in a subcuticular fashion to re-approximate the hymenal ring. After plication, the vaginal mucosa was reapproximated using  2-0 Vicryl sutures.  The vagina was copiously irrigated.  Hemostasis was noted. A rectal examination was normal and confirmed no sutures within the rectum. Three fingers passed through the vaginal opening without difficulty.  Sponge, lap, and needle counts were correct x 2. The patient tolerated the procedure well. She was awakened from anesthesia and transferred to the recovery room in stable condition.

## 2023-11-20 NOTE — Interval H&P Note (Signed)
History and Physical Interval Note:  11/20/2023 10:55 AM  Julianne Handler Heggs  has presented today for surgery, with the diagnosis of stage III pelvic organ prolapse; stress urinary incontinence.  The various methods of treatment have been discussed with the patient and family. After consideration of risks, benefits and other options for treatment, the patient has consented to  Procedure(s): XI ROBOTIC ASSISTED TOTAL HYSTERECTOMY WITH BILATERAL SALPINGECTOMY AND SACROCOLPOPEXY (N/A) POSTERIOR REPAIR (RECTOCELE) WITH PERINEORRHAPHY (N/A) TRANSVAGINAL TAPE (TVT) PROCEDURE (N/A) CYSTOSCOPY (N/A) as a surgical intervention.  The patient's history has been reviewed, patient examined, no change in status, stable for surgery.  I have reviewed the patient's chart and labs.  Questions were answered to the patient's satisfaction.    Today's Vitals   11/13/23 1350 11/20/23 0925  BP:  (!) 143/82  Pulse:  71  Resp:  17  Temp:  97.7 F (36.5 C)  TempSrc:  Oral  SpO2:  99%  Weight: 68 kg 81.7 kg  Height: 5\' 6"  (1.676 m) 5\' 6"  (1.676 m)   Body mass index is 29.07 kg/m.   Ferrah Panagopoulos Dorita Fray

## 2023-11-20 NOTE — Discharge Instructions (Signed)

## 2023-11-20 NOTE — Anesthesia Postprocedure Evaluation (Signed)
Anesthesia Post Note  Patient: Lener Gomer Sjogren  Procedure(s) Performed: XI ROBOTIC ASSISTED TOTAL HYSTERECTOMY WITH BILATERAL SALPINGECTOMY AND SACROCOLPOPEXY (Abdomen) PERINEORRHAPHY (Vagina ) TRANSVAGINAL TAPE (TVT) PROCEDURE (Vagina ) CYSTOSCOPY (Bladder) LAPAROSCOPIC LYSIS OF ADHESIONS (Abdomen)     Patient location during evaluation: PACU Anesthesia Type: General Level of consciousness: awake and alert and oriented Pain management: pain level controlled Vital Signs Assessment: post-procedure vital signs reviewed and stable Respiratory status: spontaneous breathing, nonlabored ventilation and respiratory function stable Cardiovascular status: blood pressure returned to baseline and stable Postop Assessment: no apparent nausea or vomiting Anesthetic complications: no   No notable events documented.  Last Vitals:  Vitals:   11/20/23 0925 11/20/23 1622  BP: (!) 143/82 (!) 145/79  Pulse: 71 95  Resp: 17 (!) 21  Temp: 36.5 C (!) 36.4 C  SpO2: 99% 100%    Last Pain:  Vitals:   11/20/23 1622  TempSrc:   PainSc: Asleep                 Idalia Allbritton A.

## 2023-11-20 NOTE — Transfer of Care (Signed)
Immediate Anesthesia Transfer of Care Note  Patient: Samantha Peck  Procedure(s) Performed: Procedure(s) (LRB): XI ROBOTIC ASSISTED TOTAL HYSTERECTOMY WITH BILATERAL SALPINGECTOMY AND SACROCOLPOPEXY (N/A) PERINEORRHAPHY (N/A) TRANSVAGINAL TAPE (TVT) PROCEDURE (N/A) CYSTOSCOPY (N/A) LAPAROSCOPIC LYSIS OF ADHESIONS (N/A)  Patient Location: PACU  Anesthesia Type: General  Level of Consciousness: awake, oriented, sedated and patient cooperative  Airway & Oxygen Therapy: Patient Spontanous Breathing and Patient connected to face mask oxygen  Post-op Assessment: Report given to PACU RN and Post -op Vital signs reviewed and stable  Post vital signs: Reviewed and stable  Complications: No apparent anesthesia complications Last Vitals:  Vitals Value Taken Time  BP 145/79 11/20/23 1622  Temp    Pulse 92 11/20/23 1624  Resp 16 11/20/23 1624  SpO2 100 % 11/20/23 1624  Vitals shown include unfiled device data.  Last Pain:  Vitals:   11/20/23 0925  TempSrc: Oral         Complications: No notable events documented.

## 2023-11-20 NOTE — Anesthesia Procedure Notes (Addendum)
Procedure Name: Intubation Date/Time: 11/20/2023 11:25 AM  Performed by: Francie Massing, CRNAPre-anesthesia Checklist: Patient identified, Emergency Drugs available, Suction available and Patient being monitored Patient Re-evaluated:Patient Re-evaluated prior to induction Oxygen Delivery Method: Circle system utilized Preoxygenation: Pre-oxygenation with 100% oxygen Induction Type: IV induction Ventilation: Mask ventilation without difficulty Laryngoscope Size: Mac and 3 Grade View: Grade I Tube type: Oral Number of attempts: 1 Airway Equipment and Method: Stylet and Oral airway Placement Confirmation: ETT inserted through vocal cords under direct vision, positive ETCO2 and breath sounds checked- equal and bilateral Secured at: 22 cm Tube secured with: Tape Dental Injury: Teeth and Oropharynx as per pre-operative assessment

## 2023-11-20 NOTE — Anesthesia Preprocedure Evaluation (Addendum)
Anesthesia Evaluation  Patient identified by MRN, date of birth, ID band Patient awake    Reviewed: Allergy & Precautions, NPO status , Patient's Chart, lab work & pertinent test results, reviewed documented beta blocker date and time   Airway Mallampati: II       Dental no notable dental hx. (+) Teeth Intact, Caps, Dental Advisory Given   Pulmonary former smoker   Pulmonary exam normal breath sounds clear to auscultation       Cardiovascular hypertension, Pt. on medications Normal cardiovascular exam Rhythm:Regular Rate:Normal     Neuro/Psych negative neurological ROS  negative psych ROS   GI/Hepatic negative GI ROS, Neg liver ROS,,,  Endo/Other  Hx/o Grave's disease- in remission since 2010  Renal/GU negative Renal ROS Bladder dysfunction  Stress urinary incontinence    Musculoskeletal negative musculoskeletal ROS (+)    Abdominal   Peds  Hematology negative hematology ROS (+)   Anesthesia Other Findings   Reproductive/Obstetrics Stage III pelvic organ prolapse                               Anesthesia Physical Anesthesia Plan  ASA: 2  Anesthesia Plan: General   Post-op Pain Management: Dilaudid IV, Tylenol PO (pre-op)* and Precedex   Induction: Intravenous  PONV Risk Score and Plan: 4 or greater and Treatment may vary due to age or medical condition, Midazolam, Scopolamine patch - Pre-op, Ondansetron and Dexamethasone  Airway Management Planned: Oral ETT  Additional Equipment: None  Intra-op Plan:   Post-operative Plan: Extubation in OR  Informed Consent: I have reviewed the patients History and Physical, chart, labs and discussed the procedure including the risks, benefits and alternatives for the proposed anesthesia with the patient or authorized representative who has indicated his/her understanding and acceptance.     Dental advisory given  Plan Discussed with:  CRNA and Anesthesiologist  Anesthesia Plan Comments:         Anesthesia Quick Evaluation

## 2023-11-21 ENCOUNTER — Encounter (HOSPITAL_BASED_OUTPATIENT_CLINIC_OR_DEPARTMENT_OTHER): Payer: Self-pay | Admitting: Obstetrics

## 2023-11-21 ENCOUNTER — Telehealth: Payer: Self-pay

## 2023-11-21 DIAGNOSIS — N813 Complete uterovaginal prolapse: Secondary | ICD-10-CM | POA: Diagnosis not present

## 2023-11-21 LAB — SURGICAL PATHOLOGY

## 2023-11-21 MED ORDER — OXYCODONE HCL 5 MG PO TABS
ORAL_TABLET | ORAL | Status: AC
  Filled 2023-11-21: qty 1

## 2023-11-21 MED ORDER — ACETAMINOPHEN 500 MG PO TABS
ORAL_TABLET | ORAL | Status: AC
  Filled 2023-11-21: qty 2

## 2023-11-21 MED ORDER — IBUPROFEN 200 MG PO TABS
ORAL_TABLET | ORAL | Status: AC
  Filled 2023-11-21: qty 3

## 2023-11-21 NOTE — Telephone Encounter (Signed)
Per Dr Bartholomew Crews request have the patient come for a voiding trial. I left a message for a return call. Booked voiding trial for 11-23-2023 @ 10 am. Per Dr Olena Leatherwood make sure patient is moving her bowels.

## 2023-11-21 NOTE — Progress Notes (Signed)
[facility]  Date of Visit: 09/05/2023  History of Present Illness: Samantha Peck is a 57 y.o. female POD#1 s/p robotic assisted laparoscopic lysis of adhesion, hysterectomy, sacrocolpopexy, bilateral salpingectomy, posterior repair, perineorrhaphy, midurethral sling.   She did not pass her postoperative void trial on POD#0, pending repeat void trial today.  Postoperative course was uncomplicated.   Today she reports mild pain well controlled by oral pain medications, tolerating oral fluid and food intake.  Reports minimal blood on pad. Reports ambulating without assistance and denies bowel movements.  Pathology results: pending  Medications: She @CMEDP @   Allergies: Patient has No Known Allergies.   Physical Exam: BP 108/63 (BP Location: Left Arm)   Pulse 71   Temp 98.2 F (36.8 C) (Oral)   Resp 16   Ht 5\' 6"  (1.676 m)   Wt 81.7 kg   LMP 09/12/2019 (Approximate)   SpO2 98%   BMI 29.07 kg/m   Abdomen: soft, non-tender, without masses or organomegaly, nondistended, and laparoscopic incisions C/D/I x 5 with Dermabond in place.  Suprapubic Incisions: no dehiscence, no significant erythema, Dermabond in place.  Pelvic Examination: no active bleeding, minimal blood on pad.  ---------------------------------------------------------  Assessment and Plan:   Gen/Pain - well controlled with oral pain medications, continue scheduled NSAID/Tylenol with PRN opioids.  Cardio/pulm - no acute respiratory distress, VSS GI/constipation - baseline prior to surgery, start Miralax. Encouraged mag citrate or enema on POD#2-3 in the office for repeat void trial GU - foley removed pending void with retrograde fill, discussed possible foley replacement and office follow-up in 1-2 days for repeat void trial DVT prophylaxis - encouraged early ambulation Dispo - this am, meeting criteria for discharge - Discussed avoidance of heavy lifting and straining    All questions answered.

## 2023-11-21 NOTE — Telephone Encounter (Signed)
Patient confirmed appointment. She has already started her Miralax.

## 2023-11-23 ENCOUNTER — Ambulatory Visit: Payer: Managed Care, Other (non HMO)

## 2023-11-23 ENCOUNTER — Other Ambulatory Visit (HOSPITAL_COMMUNITY): Payer: Self-pay

## 2023-11-23 VITALS — BP 120/72 | HR 58

## 2023-11-23 DIAGNOSIS — N368 Other specified disorders of urethra: Secondary | ICD-10-CM

## 2023-11-23 MED ORDER — PHENAZOPYRIDINE HCL 200 MG PO TABS
200.0000 mg | ORAL_TABLET | Freq: Three times a day (TID) | ORAL | 0 refills | Status: DC | PRN
  Filled 2023-11-23 (×2): qty 10, 4d supply, fill #0

## 2023-11-23 NOTE — Progress Notes (Addendum)
Urogyn Nurse Voiding Trial Note  Samantha Peck underwent XI ROBOTIC ASSISTED TOTAL HYSTERECTOMY WITH BILATERAL SALPINGECTOMY AND SACROCOLPOPEXY (Abdomen)  PERINEORRHAPHY (Vagina )  TRANSVAGINAL TAPE (TVT) PROCEDURE (Vagina )  CYSTOSCOPY (Bladder)  LAPAROSCOPIC LYSIS OF ADHESIONS (Abdomen)  on 11/20/2023.  She presents today for a voiding trial .   Patient was identified with 2 indicators. 200 ml of NS was instilled into the bladder via the catheter. The catheter was removed and patient was instructed to void into the urinary hat. She voided 70 ml. The post void residual measured by bladder scan was 56 ml. She passed the voiding trial and a catheter was not replaced.   The patient received aftercare instructions and will follow up as scheduled.   Samantha Peck advised patient and documented.  Called patient at 2:00 pm, patient stated she is urinating well and has urinated 2 to 3 times since leaving our office. I encouraged patient to call the office if she is no longer urinating normally.

## 2023-11-23 NOTE — Progress Notes (Signed)
Pt presented POD#3 s/p robotic assisted TLH, sacrocolpopexy, midurethral sling, LOA, posterior repair, perineorrhaphy.   Retrograde filled to , voided 70mL with PVR 56mL Patient reports urethral irritation and itching.  Pt reports anxiety regarding foley replacement  Recommended for patient to return by 3pm for bladder scan and possible foley replacement if she continues to experience dysuria, inability to void, or sensation of incomplete emptying.  Rx pyridium sent for discomfort noted during voids.  Will consider Rx antibiotics for presumed UTI if symptoms worsen.

## 2023-11-23 NOTE — Patient Instructions (Signed)
Please drink lots of water and try to expel as much urine as you are inputting. If you are unable to urinate, give the office a call before 2 pm. Please keep all future appointments and if you have any questions or concerns please feel free to contact our office at [phone].

## 2023-11-27 ENCOUNTER — Other Ambulatory Visit (HOSPITAL_COMMUNITY)
Admission: RE | Admit: 2023-11-27 | Discharge: 2023-11-27 | Disposition: A | Discharge status: Home / Self Care | Payer: Managed Care, Other (non HMO) | Source: Ambulatory Visit | Attending: Obstetrics | Admitting: Obstetrics

## 2023-11-27 ENCOUNTER — Other Ambulatory Visit (HOSPITAL_COMMUNITY): Payer: Self-pay

## 2023-11-27 ENCOUNTER — Other Ambulatory Visit: Payer: Self-pay

## 2023-11-27 ENCOUNTER — Ambulatory Visit (INDEPENDENT_AMBULATORY_CARE_PROVIDER_SITE_OTHER): Payer: Managed Care, Other (non HMO)

## 2023-11-27 VITALS — BP 113/80 | HR 84

## 2023-11-27 DIAGNOSIS — R82998 Other abnormal findings in urine: Secondary | ICD-10-CM | POA: Insufficient documentation

## 2023-11-27 DIAGNOSIS — R319 Hematuria, unspecified: Secondary | ICD-10-CM

## 2023-11-27 DIAGNOSIS — R350 Frequency of micturition: Secondary | ICD-10-CM | POA: Diagnosis not present

## 2023-11-27 LAB — URINALYSIS, ROUTINE W REFLEX MICROSCOPIC
Bilirubin Urine: NEGATIVE
Glucose, UA: NEGATIVE mg/dL
Ketones, ur: NEGATIVE mg/dL
Nitrite: NEGATIVE
Protein, ur: 30 mg/dL — AB
Specific Gravity, Urine: 1.011 (ref 1.005–1.030)
pH: 7 (ref 5.0–8.0)

## 2023-11-27 LAB — POCT URINALYSIS DIPSTICK
Bilirubin, UA: NEGATIVE
Glucose, UA: NEGATIVE
Ketones, UA: NEGATIVE
Nitrite, UA: POSITIVE
Protein, UA: NEGATIVE
Spec Grav, UA: 1.02 (ref 1.010–1.025)
Urobilinogen, UA: 0.2 U/dL
pH, UA: 7 (ref 5.0–8.0)

## 2023-11-27 MED ORDER — NITROFURANTOIN MONOHYD MACRO 100 MG PO CAPS
100.0000 mg | ORAL_CAPSULE | Freq: Two times a day (BID) | ORAL | 0 refills | Status: DC
  Filled 2023-11-27 (×2): qty 10, 5d supply, fill #0

## 2023-11-27 NOTE — Progress Notes (Signed)
Samantha Peck is a 57 y.o. female  arrived today with UTI sx.  Per Dr. Jari Favre protocol: A urine specimen was collected and POCT Urine was done and urine culture sent to the lab. POCT Urine was Positive  Pt was notified and prescription sent to the preferred pharmacy.

## 2023-11-27 NOTE — Patient Instructions (Signed)
Your Urine dip that was done in office was Positive. I am sending the urine off for culture and you can take AZO over the counter for your discomfort.  We have also ordered Macrobid for you to take while we wait for your culture results, hopefully this gives you some relief. We will contact you when the results are back between 3-5 days. If a different antibiotic is needed we will sent the order to the pharmacy and you will be notified. If you have any questions or concerns please feel free to call us at [phone]

## 2023-11-29 ENCOUNTER — Other Ambulatory Visit (HOSPITAL_COMMUNITY): Payer: Self-pay

## 2023-11-29 ENCOUNTER — Other Ambulatory Visit: Payer: Self-pay | Admitting: Obstetrics

## 2023-11-29 DIAGNOSIS — A498 Other bacterial infections of unspecified site: Secondary | ICD-10-CM

## 2023-11-29 LAB — URINE CULTURE: Culture: >=100000 — AB

## 2023-11-29 MED ORDER — CIPROFLOXACIN HCL 500 MG PO TABS
500.0000 mg | ORAL_TABLET | Freq: Two times a day (BID) | ORAL | 0 refills | Status: AC
  Filled 2023-11-29: qty 14, 7d supply, fill #0

## 2023-12-04 ENCOUNTER — Other Ambulatory Visit (HOSPITAL_COMMUNITY): Payer: Self-pay

## 2023-12-04 ENCOUNTER — Telehealth: Payer: Self-pay

## 2023-12-04 ENCOUNTER — Encounter: Payer: Self-pay | Admitting: Obstetrics and Gynecology

## 2023-12-04 MED ORDER — SULFAMETHOXAZOLE-TRIMETHOPRIM 800-160 MG PO TABS
1.0000 | ORAL_TABLET | Freq: Two times a day (BID) | ORAL | 0 refills | Status: AC
  Filled 2023-12-04: qty 14, 7d supply, fill #0

## 2023-12-04 NOTE — Telephone Encounter (Addendum)
Patient called stating she has been taking Cipro since 12/18 but it isn't relieving any symptoms. She is still having frequency and burning while urinating. Please advise.

## 2023-12-04 NOTE — Telephone Encounter (Signed)
Patient has been notified that medication was available for pick up at pharmacy.

## 2023-12-07 ENCOUNTER — Telehealth: Payer: Self-pay | Admitting: Obstetrics

## 2023-12-07 ENCOUNTER — Other Ambulatory Visit (HOSPITAL_COMMUNITY): Payer: Self-pay

## 2023-12-07 NOTE — Telephone Encounter (Signed)
Called patient to assess clinical change after change of antibiotics.  57yo POD#17 s/p RATLH, bilateral salpingectomy, TVT, LOA, perineorrhaphy, and cystoscopy.  Passed void trial on POD#3.  Presented to office on POD#7 for urine testing due to urinary frequency and dysuria.  UA 11/27/23 + leuk/heme, culture with > 100K Morganella Morganii resistant to ampicillin, macrobid. Intermediate to augmentin.  Rx Cipro for 7 days, patient reports reduced urinary frequency and dysuria with return of symptoms after completion of Cipro.  Rx changed to Bactrim.  Patient reports resolution of dysuria, hematuria, and improvement of urinary frequency since starting Bactrim.  Denies fever, N/V, one sided back pain. Encouraged pt to present to the office if she experiences experience persistent or worsening urinary symptoms such as fever > 100.4, nausea/vomiting, one sided back pain or blood in your urine.  Patient expresses understanding and will call office tomorrow if clinical change.  Next appt scheduled 01/01/24

## 2023-12-21 ENCOUNTER — Other Ambulatory Visit: Payer: Self-pay | Admitting: Obstetrics

## 2023-12-21 ENCOUNTER — Ambulatory Visit (INDEPENDENT_AMBULATORY_CARE_PROVIDER_SITE_OTHER): Payer: Managed Care, Other (non HMO)

## 2023-12-21 VITALS — BP 133/83 | HR 72

## 2023-12-21 DIAGNOSIS — R350 Frequency of micturition: Secondary | ICD-10-CM

## 2023-12-21 DIAGNOSIS — A498 Other bacterial infections of unspecified site: Secondary | ICD-10-CM

## 2023-12-21 LAB — POCT URINALYSIS DIPSTICK
Bilirubin, UA: NEGATIVE
Blood, UA: NEGATIVE
Glucose, UA: NEGATIVE
Ketones, UA: NEGATIVE
Nitrite, UA: NEGATIVE
Protein, UA: NEGATIVE
Spec Grav, UA: 1.01 (ref 1.010–1.025)
Urobilinogen, UA: 0.2 U/dL
pH, UA: 6.5 (ref 5.0–8.0)

## 2023-12-21 MED ORDER — FLUCONAZOLE 150 MG PO TABS
150.0000 mg | ORAL_TABLET | Freq: Once | ORAL | 0 refills | Status: DC
  Filled 2023-12-21: qty 1, 1d supply, fill #0

## 2023-12-21 MED ORDER — CIPROFLOXACIN HCL 500 MG PO TABS: 500.0000 mg | ORAL_TABLET | Freq: Two times a day (BID) | ORAL | 0 refills | Status: AC

## 2023-12-21 MED ORDER — CIPROFLOXACIN HCL 500 MG PO TABS
500.0000 mg | ORAL_TABLET | Freq: Two times a day (BID) | ORAL | 0 refills | Status: DC
  Filled 2023-12-21: qty 20, 10d supply, fill #0

## 2023-12-21 MED ORDER — FLUCONAZOLE 150 MG PO TABS: 150.0000 mg | ORAL_TABLET | Freq: Once | ORAL | 0 refills | Status: AC

## 2023-12-21 NOTE — Progress Notes (Signed)
 Reports increased urinary frequency and vaginal irritation, reports leakage when she wipes postvoid.  Denies hematuria, back pain, fever, N/V.  Stopped gemtesa  due to cost.  Presented to office today. UA +leuk. Reports sensation of incomplete emptying due to increased urinary frequency.  Rx Diflucan  and Cipro  for presumed UTI and possible candidiasis due to recent multiple antibiotic exposure.  Advised office evaluation tomorrow at 9:20 for bladder scan to r/o urinary retention, vaginal exam to r/o vaginitis and evaluate for UTI if no improvement in urinary or vaginal symptoms. Patient expresses understanding.

## 2023-12-21 NOTE — Patient Instructions (Signed)
 Your Urine dip that was done in office was Negative. I am sending the urine off for culture and you can take AZO over the counter for your discomfort.  We will contact you when the results are back between 3-5 days. If an antibiotic is needed we will sent the order to the pharmacy and you will be notified. If you have any questions or concerns please feel free to call us at [phone]

## 2023-12-21 NOTE — Progress Notes (Signed)
 Samantha Peck is a 58 y.o. female  arrived today with UTI sx.  A urine specimen was collected and POCT Urine was done. Urine was sent for culture POCT Urine was Negative

## 2023-12-22 ENCOUNTER — Other Ambulatory Visit (HOSPITAL_COMMUNITY)
Admission: RE | Admit: 2023-12-22 | Discharge: 2023-12-22 | Disposition: A | Discharge status: Home / Self Care | Payer: Managed Care, Other (non HMO) | Source: Ambulatory Visit | Attending: Obstetrics and Gynecology | Admitting: Obstetrics and Gynecology

## 2023-12-22 ENCOUNTER — Ambulatory Visit: Payer: Managed Care, Other (non HMO) | Admitting: Obstetrics and Gynecology

## 2023-12-22 ENCOUNTER — Encounter: Payer: Self-pay | Admitting: Obstetrics and Gynecology

## 2023-12-22 ENCOUNTER — Other Ambulatory Visit (HOSPITAL_COMMUNITY): Payer: Self-pay

## 2023-12-22 ENCOUNTER — Other Ambulatory Visit: Payer: Self-pay

## 2023-12-22 VITALS — BP 152/79 | HR 88

## 2023-12-22 DIAGNOSIS — N3289 Other specified disorders of bladder: Secondary | ICD-10-CM

## 2023-12-22 DIAGNOSIS — N898 Other specified noninflammatory disorders of vagina: Secondary | ICD-10-CM | POA: Insufficient documentation

## 2023-12-22 DIAGNOSIS — R350 Frequency of micturition: Secondary | ICD-10-CM

## 2023-12-22 LAB — POCT URINALYSIS DIPSTICK
Bilirubin, UA: NEGATIVE
Blood, UA: NEGATIVE
Glucose, UA: NEGATIVE
Ketones, UA: NEGATIVE
Leukocytes, UA: NEGATIVE
Nitrite, UA: NEGATIVE
Protein, UA: NEGATIVE
Spec Grav, UA: >=1.03 — AB (ref 1.010–1.025)
Urobilinogen, UA: 0.2 U/dL
pH, UA: 6.5 (ref 5.0–8.0)

## 2023-12-22 MED ORDER — HYDROXYZINE HCL 50 MG PO TABS
50.0000 mg | ORAL_TABLET | Freq: Every evening | ORAL | 5 refills | Status: DC
  Filled 2023-12-22 (×2): qty 30, 30d supply, fill #0

## 2023-12-22 MED ORDER — AMITRIPTYLINE HCL 10 MG PO TABS
10.0000 mg | ORAL_TABLET | Freq: Every day | ORAL | 2 refills | Status: DC
  Filled 2023-12-22 (×2): qty 30, 30d supply, fill #0
  Filled 2024-01-15: qty 30, 30d supply, fill #1

## 2023-12-22 MED ORDER — FLUCONAZOLE 150 MG PO TABS
150.0000 mg | ORAL_TABLET | Freq: Once | ORAL | 0 refills | Status: AC
  Filled 2023-12-22 (×2): qty 1, 1d supply, fill #0

## 2023-12-22 NOTE — Progress Notes (Signed)
 [facility] Return Visit  SUBJECTIVE  History of Present Illness: Samantha Peck is a 58 y.o. female seen in follow-up for Bladder irritation, possible retention, and vaginal irritation.   Patient reports she feels like she is constantly running to the bathroom and that she has to pee again immediately.   Patient reports a lot of anxiety and stressors recently as she recently lost one of her cats.  One of her other cats had to have surgery.  And her husband was recently hospitalized and has been diagnosed with stage IV heart failure along with adrenal insufficiency.  Patient has been under high amounts of stress and feels very anxious about everything going on.  Past Medical History: Patient  has a past medical history of Graves' disease in remission (2010), History of nonmelanoma skin cancer, Hypertension, 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 pelvic organs, and SUI (stress urinary incontinence, female).   Past Surgical History: She  has a past surgical history that includes Bunionectomy (Bilateral, 1996); Xi robotic assisted total hysterectomy with sacrocolpopexy (N/A, 11/20/2023); Rectocele repair (N/A, 11/20/2023); Bladder suspension (N/A, 11/20/2023); Cystoscopy (N/A, 11/20/2023); and Laparoscopic lysis of adhesions (N/A, 11/20/2023).   Medications: She has a current medication list which includes the following prescription(s): acetaminophen , amitriptyline , chlorthalidone , ciprofloxacin , fluconazole , hydroxyzine , ibuprofen , losartan , methimazole , nitrofurantoin  (macrocrystal-monohydrate), oxycodone , phenazopyridine , phenazopyridine , polyethylene glycol powder, and gemtesa .   Allergies: Patient has no known allergies.   Social History: Patient  reports that she has quit smoking. Her smoking use included cigarettes. She started smoking about 37 years ago. She has never used smokeless tobacco. She reports that she does not currently use alcohol. She reports that she does not use drugs.      OBJECTIVE     Physical Exam: Vitals:   12/22/23 0939  BP: (!) 152/79  Pulse: 88   Gen: No apparent distress, A&O x 3.  Detailed Urogynecologic Evaluation:  Patient's surgical sutures are noted to be healing well at the vaginal opening.  Speculum exam revealed almost completely healed cuff sutures. No concerning signs for infection.  Patient urinated and then we performed straight catheterization with a PVR of 100.        ASSESSMENT AND PLAN    Ms. Sligh is a 58 y.o. with:  1. Bladder irritation   2. Vaginal irritation   3. Urinary frequency    Patient has significant bladder irritation and is currently on antibiotics for possible UTI.  Catheterized sample obtained today and will send for culture.  Patient is currently on ciprofloxacin  500 mg for the next 10 days.  And she was given a dose of Diflucan  that she can take.  For patient's bladder irritation and spasm sensation we will start her on hydroxyzine  50 mg nightly combined with amitriptyline  10 mg to see if this is helpful for the associated poor sleep and anxiety surrounding her bladder pain and irritation.  We discussed that if her side effects such as somnolence or other irritation seems to be too great that she should let us  know so that we can change her medication. Aptima swab obtained today to rule out yeast or bacterial vaginosis.  We discussed that this can sometimes happen with vaginal sutures dissolving after surgery that sometimes the normal bacteria in the vagina can get overabundance. For patient's urinary frequency she was given 6 weeks of samples of Gemtesa .  She is concerned because she felt this was not necessarily doing much before and we discussed that since she is having so much urgency and frequency now  it could have been doing more than we realize prior to surgery.   Patient has a follow-up scheduled with Dr. Guadlupe in 10 days and medication adjustments can be made if necessary at that time.   Samantha Peck  Samantha Milham, NP

## 2023-12-22 NOTE — Patient Instructions (Signed)
 Start the Hydroxyzine  at nighttime to help with the bladder irritation and the Amitriptyline . This should help you sleep and calm down the nerves and bladder irritation.   I have sent in another dose of Diflucan  for you to take in case you get more vaginal irritation after you finish the antibioitcs.   You do not have urinary retention, there is only a small amount left in the bladder. This is a good sign.   Re-start the gemtesa  75mg  daily.

## 2023-12-23 LAB — URINE CULTURE: Culture: NO GROWTH

## 2023-12-25 ENCOUNTER — Telehealth: Payer: Self-pay | Admitting: Obstetrics and Gynecology

## 2023-12-25 LAB — CERVICOVAGINAL ANCILLARY ONLY
Bacterial Vaginitis (gardnerella): NEGATIVE
Candida Glabrata: NEGATIVE
Candida Vaginitis: NEGATIVE
Comment: NEGATIVE
Comment: NEGATIVE
Comment: NEGATIVE

## 2023-12-25 NOTE — Telephone Encounter (Signed)
 Called and spoke to patient:  We discussed that she could stop the antibiotics as her culture was negative.  Patient reports she did not react well to the Hydroxyzine  and got really significant symptoms of restless leg and felt overstimulated and had mood swings. She has stopped taking this medication.   Patient reports the Amitriptyline  has not yet helped with the irritative bladder symptoms but she states it did help her sleep.   Patient states she is not noticing any difference at this time with the Gemtesa . She may benefit from a different medication for OAB. Will confer with Dr. Guadlupe on this. Patient has a follow up with Dr. Guadlupe next Monday.

## 2023-12-26 ENCOUNTER — Other Ambulatory Visit: Payer: Self-pay

## 2024-01-01 ENCOUNTER — Other Ambulatory Visit: Payer: Self-pay

## 2024-01-01 ENCOUNTER — Other Ambulatory Visit (HOSPITAL_BASED_OUTPATIENT_CLINIC_OR_DEPARTMENT_OTHER): Payer: Self-pay

## 2024-01-01 ENCOUNTER — Other Ambulatory Visit (HOSPITAL_COMMUNITY): Payer: Self-pay

## 2024-01-01 ENCOUNTER — Encounter: Payer: Self-pay | Admitting: Obstetrics

## 2024-01-01 ENCOUNTER — Ambulatory Visit (INDEPENDENT_AMBULATORY_CARE_PROVIDER_SITE_OTHER): Payer: Managed Care, Other (non HMO) | Admitting: Obstetrics

## 2024-01-01 VITALS — BP 139/83 | HR 79

## 2024-01-01 DIAGNOSIS — R350 Frequency of micturition: Secondary | ICD-10-CM | POA: Diagnosis not present

## 2024-01-01 DIAGNOSIS — R102 Pelvic and perineal pain: Secondary | ICD-10-CM | POA: Insufficient documentation

## 2024-01-01 DIAGNOSIS — Z48816 Encounter for surgical aftercare following surgery on the genitourinary system: Secondary | ICD-10-CM

## 2024-01-01 DIAGNOSIS — K5900 Constipation, unspecified: Secondary | ICD-10-CM | POA: Diagnosis not present

## 2024-01-01 DIAGNOSIS — R351 Nocturia: Secondary | ICD-10-CM | POA: Insufficient documentation

## 2024-01-01 MED ORDER — LIDOCAINE HCL 2 % IJ SOLN
20.0000 mL | Freq: Once | INTRAMUSCULAR | Status: AC
  Administered 2024-01-01: 400 mg

## 2024-01-01 MED ORDER — HEPARIN SODIUM (PORCINE) 10000 UNIT/ML IJ SOLN
10000.0000 [IU] | Freq: Once | INTRAMUSCULAR | Status: AC
  Administered 2024-01-01: 10000 [IU] via INTRAVESICAL

## 2024-01-01 MED ORDER — CIMETIDINE 200 MG PO TABS
200.0000 mg | ORAL_TABLET | Freq: Every day | ORAL | 0 refills | Status: DC
Start: 2024-01-01 — End: 2024-05-17

## 2024-01-01 MED ORDER — SODIUM BICARBONATE 8.4 % IV SOLN
5.0000 mL | Freq: Once | INTRAVENOUS | Status: AC
  Administered 2024-01-01: 5 mL

## 2024-01-01 MED ORDER — BUPIVACAINE HCL 0.25 % IJ SOLN
20.0000 mL | Freq: Once | INTRAMUSCULAR | Status: AC
  Administered 2024-01-01: 20 mL

## 2024-01-01 MED ORDER — CIMETIDINE 200 MG PO TABS
200.0000 mg | ORAL_TABLET | Freq: Every day | ORAL | 0 refills | Status: DC
  Filled 2024-01-01: qty 60, 60d supply, fill #0

## 2024-01-01 NOTE — Assessment & Plan Note (Addendum)
-   reviewed baseline urinary frequency and urgency prior to surgery. - UDS with DOI and dyssynergic EMG - We discussed the symptoms of overactive bladder (OAB), which include urinary urgency, urinary frequency, nocturia, with or without urge incontinence.  While we do not know the exact etiology of OAB, several treatment options exist. We discussed management including behavioral therapy (decreasing bladder irritants, urge suppression strategies, timed voids, bladder retraining), physical therapy, medication; for refractory cases posterior tibial nerve stimulation, sacral neuromodulation, and intravesical botulinum toxin injection.  For anticholinergic medications, we discussed the potential side effects of anticholinergics including dry eyes, dry mouth, constipation, cognitive impairment and urinary retention. Avoided due to history of constipation For Beta-3 agonist medication, we discussed the potential side effect of elevated blood pressure which is more likely to occur in individuals with uncontrolled hypertension. - continue Gemtesa - encouraged bladder training, reduction of pepsi intake and increase water intake - encouraged to consider pelvic floor PT  - scheduled cystoscopy to r/o foreign material in bladder or urethra, no mesh visualized  or palpated during vaginal exam  - reviewed overlapping symptomatology with interstitial cystitis, improved with night time hydroxyzine but cannot tolerate exacerbation of restless legs (similar effect from benadryl) - switched to cimetidine and titrate up to 2x/day if tolerated - For irritative bladder we reviewed treatment options including altering her diet to avoid irritative beverages and foods as well as attempting to decrease stress and other exacerbating factors.  We also discussed using pyridium and similar over-the-counter medications for pain relief as needed. We discussed the pentad of medications including Tums, an antihistamine such as Vistaril,  amitriptyline, and L-arginine.  We also discussed in-office bladder instillations for pain flares, as well as cystoscopy with hydrodistention in the operating room, which can be both diagnostic and therapeutic. She was also given information on the IC Network at https://www.ic-network.com for bladder diet suggestions and patient forums for support. - underwent bladder instillation today, return in 1 week if she desires to continue for 6 weeks - encouraged stress management due to recent loss of cat and husband's heart failure diagnosis, however reports repeat Echo with improved EF.

## 2024-01-01 NOTE — Progress Notes (Signed)
[facility]  Date of Visit: 01/01/2024  History of Present Illness: Ms. Bertone is a 58 y.o. female scheduled today for a post-operative visit.   Surgery: s/p Robotic assisted laparoscopic lysis of adhesion, hysterectomy, sacrocolpopexy, bilateral salpingectomy, posterior repair, perineorrhaphy, midurethral sling on 11/20/23 for stage III pelvic organ prolapse, stress urinary incontinence, constipation, and pelvic adhesions.  She did not pass her postoperative void trial. Passed in office on POD#3 with bladder filled only to and voided 70mL with PVR 56mL. Started pyridium due to urethral irritation.    Postoperative course was complicated by urinary frequency and dysuria, presented on POD#7 for evaluation and started macrobid.  UA + leuk/nit/heme, culture >100K Morganella morganii resistant to ampicillin and Macrobid, intermediate to augmentin. 11/29/23 Rx changed to Cipro with improvement of symptoms.  Patient reports symptoms returned after completion of Cipro and changed to Bactrim.  12/21/23 evaluated due to urinary frequency and vaginal irritation, reports leakage when she wipes postvoid. UA trace leuk. Rx Diflucan and Cipro for presumed UTI and possible candidiasis due to recent multiple antibiotic exposure. Stopped gemtesa postop due to cost.  12/22/23  Negative Nuswab and UA on Cipro. Negative urine culture and PVR via catheterization.  Recent loss of cat and husband diagnosed with Stage IV heart failure with adrenal insufficiency.  Started hydroxyzine and amitriptyline 10mg  at bedtime to assess sleep and bladder irritation. Samples to resume Gemtesa.   Reports relief of night time urinary frequency from hydroxyzine, however it worsens restless legs, overstimulation and mood swings. Continues Amitriptyline due to improved sleep. Switched to dinner time dosing with less daytime sedation History of Grave's disease Reports voiding 30x/day, denies relief  Baseline: Day  time voids 11-20 times per day.  Nocturia: 3-7 times per night to void.  Now voids around 20x/day and 6-7x/night Drinks: 5 cans of caffeine free, diet pepsi per day   Today she reports quarter size blood on underwear started 3 days ago, managed by 1 panty liner, resolved today.   UTI in the last 6 weeks? Yes  Pain? Yes lower abdominal cramping managed with Ibuprofen 400mg  2-3x/week She has not returned to her normal activity (except for postop restrictions) Vaginal bulge?  Reports feeling funny, denies bulge from self examination Stress incontinence: No  Urgency/frequency: Yes  Urge incontinence: No  Voiding dysfunction: No  Bowel issues:  1-3x/day with miralax  Subjective Success: Do you usually have a bulge or something falling out that you can see or feel in the vaginal area? No  Retreatment Success: Any retreatment with surgery or pessary for any compartment? No   Pathology results: benign  Medications: She has a current medication list which includes the following prescription(s): acetaminophen, amitriptyline, chlorthalidone, cimetidine, ibuprofen, losartan, methimazole, nitrofurantoin (macrocrystal-monohydrate), oxycodone, phenazopyridine, phenazopyridine, polyethylene glycol powder, and gemtesa.   Allergies: Patient has no known allergies.   Physical Exam: BP 139/83   Pulse 79   LMP 09/12/2019 (Approximate)   Abdomen: soft, non-tender, without masses or organomegaly Abdominal Incisions: healing well.  Pelvic Examination: Vagina: Incisions healing well. Sutures are present at vaginal apex and there is not granulation tissue or bleeding. No tenderness along the anterior or posterior vagina. No apical tenderness. No pelvic masses. No visible or palpable mesh. Pelvic floor muscle pain with palpation bilaterally  POP-Q: POP-Q  -3  Aa   -3                                           Ba  -5                                               C   2                                            Gh  2                                            Pb  5                                            tvl   -1                                            Ap  -1                                            Bp                                                 D   Bladder Instillation: The patient was identified and verbally consented for the procedure.  The urethra was prepped with Betadine x 3. A 16 Fr foley catheter was inserted the bladder and drained for 150 cc. The foley was then attached to a 60 mL syringe with the plunger removed.  The medication was slowly poured into the bladder via the syringe and foley.  The medication consisted of: 20ml of Lidocaine 2%, 20mL of Bupivicaine 0.25%, 10,000 units/mL Heparin, 5mL Sodium Bicarbonate 8.4%.  The foley was removed and the patient was asked to hold the liquids in her bladder for 30-60 minutes if possible.   Precautions were given and patient was instructed to call the office or on-call number for any concerns.  Loleta Chance, MD   ---------------------------------------------------------  Assessment and Plan:  1. Constipation, unspecified constipation type   2. Urinary frequency   3. Pelvic pain    Constipation, unspecified constipation type Assessment & Plan: - reduce miralax dosing to 1/4 capful/day or every other day to reduce bowel frequency - avoid straining for bowel movements   Urinary frequency Assessment & Plan: - reviewed baseline urinary frequency and urgency prior to surgery. - UDS with DOI and dyssynergic EMG - We discussed the symptoms of overactive bladder (OAB), which include urinary urgency, urinary  frequency, nocturia, with or without urge incontinence.  While we do not know the exact etiology of OAB, several treatment options exist. We discussed management including behavioral therapy (decreasing bladder irritants, urge suppression strategies, timed voids, bladder  retraining), physical therapy, medication; for refractory cases posterior tibial nerve stimulation, sacral neuromodulation, and intravesical botulinum toxin injection.  For anticholinergic medications, we discussed the potential side effects of anticholinergics including dry eyes, dry mouth, constipation, cognitive impairment and urinary retention. Avoided due to history of constipation For Beta-3 agonist medication, we discussed the potential side effect of elevated blood pressure which is more likely to occur in individuals with uncontrolled hypertension. - continue Gemtesa - encouraged bladder training, reduction of pepsi intake and increase water intake - encouraged to consider pelvic floor PT  - scheduled cystoscopy to r/o foreign material in bladder or urethra, no mesh visualized  or palpated during vaginal exam  - reviewed overlapping symptomatology with interstitial cystitis, improved with night time hydroxyzine but cannot tolerate exacerbation of restless legs (similar effect from benadryl) - switched to cimetidine and titrate up to 2x/day if tolerated - For irritative bladder we reviewed treatment options including altering her diet to avoid irritative beverages and foods as well as attempting to decrease stress and other exacerbating factors.  We also discussed using pyridium and similar over-the-counter medications for pain relief as needed. We discussed the pentad of medications including Tums, an antihistamine such as Vistaril, amitriptyline, and L-arginine.  We also discussed in-office bladder instillations for pain flares, as well as cystoscopy with hydrodistention in the operating room, which can be both diagnostic and therapeutic. She was also given information on the IC Network at https://www.ic-network.com for bladder diet suggestions and patient forums for support. - underwent bladder instillation today, return in 1 week if she desires to continue for 6 weeks - encouraged stress  management due to recent loss of cat and husband's heart failure diagnosis, however reports repeat Echo with improved EF.   Orders: -     Cimetidine; Take 1 tablet (200 mg total) by mouth at bedtime. Increase to 2 times a day if no daytime sedation  Dispense: 60 tablet; Refill: 0 -     Heparin Sodium (Porcine) -     Sodium Bicarbonate -     BUPivacaine HCl -     Lidocaine HCl  Pelvic pain Assessment & Plan: - bilateral pelvic floor myofascial pain on exam - The origin of pelvic floor muscle spasm can be multifactorial, including primary, reactive to a different pain source, trauma, or even part of a centralized pain syndrome.Treatment options include pelvic floor physical therapy, local (vaginal) or oral  muscle relaxants, pelvic muscle trigger point injections or centrally acting pain medications.   - continue amitriptyline at bedtime - encouraged to call if she desires referral for pelvic floor PT   - benign Pathology results were reviewed   - Can resume regular activity including exercise and intercourse,  if desired.  - Discussed avoidance of heavy lifting and straining long term to reduce the risk of recurrence.   All questions answered.   Return in about 1 week (around 01/08/2024) for bladder instillation.

## 2024-01-01 NOTE — Assessment & Plan Note (Signed)
-   reduce miralax dosing to 1/4 capful/day or every other day to reduce bowel frequency - avoid straining for bowel movements

## 2024-01-01 NOTE — Assessment & Plan Note (Signed)
-   bilateral pelvic floor myofascial pain on exam - The origin of pelvic floor muscle spasm can be multifactorial, including primary, reactive to a different pain source, trauma, or even part of a centralized pain syndrome.Treatment options include pelvic floor physical therapy, local (vaginal) or oral  muscle relaxants, pelvic muscle trigger point injections or centrally acting pain medications.   - continue amitriptyline at bedtime - encouraged to call if she desires referral for pelvic floor PT

## 2024-01-01 NOTE — Patient Instructions (Addendum)
We discussed the symptoms of overactive bladder (OAB), which include urinary urgency, urinary frequency, night-time urination, with or without urge incontinence.  We discussed management including behavioral therapy (decreasing bladder irritants by following a bladder diet, urge suppression strategies, timed voids, bladder retraining), physical therapy, medication; and for refractory cases posterior tibial nerve stimulation, sacral neuromodulation, and intravesical botulinum toxin injection.   Continue Gemtesa 1 tab daily.   Continue Amitriptyline at dinner time for night time urinary frequency.   Start bladder training. Reduce soda intake.   Consider pelvic floor PT  Start cimetidine 1 tab at night, can increase to 2x/day if it decreases urinary frequency without daytime sedation.  We have scheduled a cystoscopy to rule out mesh or sutures in your bladder.  - For refractory OAB we reviewed the procedure for intravesical Botox injection with cystoscopy in the office and reviewed the risks, benefits and alternatives of treatment including but not limited to infection, need for self-catheterization and need for repeat therapy.   - We discussed that there is a 5-15% chance of needing to catheterize with Botox and that this usually resolves in a few months; however can persist for longer periods of time.   - Typically Botox injections would need to be repeated every 3-12 months since this is not a permanent therapy.   Today we talked about ways to manage bladder urgency such as altering your diet to avoid irritative beverages and foods (bladder diet) as well as attempting to decrease stress and other exacerbating factors.  You can also chew a plain Tums 1-3 times per day to make your urine less acidic, especially if you have eating/drinking acidic things.   There is a website with helpful information for people with bladder irritation, called the IC Network at https://www.ic-network.com. This website  has more information about a healthy bladder diet and patient forums for support.  The Most Bothersome Foods* The Least Bothersome Foods*  Coffee - Regular & Decaf Tea - caffeinated Carbonated beverages - cola, non-colas, diet & caffeine-free Alcohols - Beer, Red Wine, White Wine, [address] - Grapefruit, [place], Orange, [employer] - Cranberry, Grapefruit, Orange, Pineapple Vegetables - Tomato & Tomato Products Flavor Enhancers - Hot peppers, Spicy foods, Chili, Horseradish, Vinegar, Monosodium glutamate (MSG) Artificial Sweeteners - NutraSweet, Sweet 'N Low, Equal (sweetener), Saccharin Ethnic foods - [place], [place], [place] food [employer] - low-fat & whole Fruits - Bananas, Blueberries, Honeydew melon, Pears, Raisins, Watermelon Vegetables - Broccoli, [address] Sprouts, [place], Carrots, Cauliflower, [place], Cucumber, Mushrooms, Peas, Radishes, Squash, Zucchini, White potatoes, Sweet potatoes & yams Poultry - Chicken, Eggs, [place], [employer] - Beef, Diplomatic Services operational officer, Lamb Seafood - Shrimp, [place] fish, Salmon Grains - Oat, Rice Snacks - Pretzels, Popcorn  *Lenward Chancellor et al. Diet and its role in interstitial cystitis/bladder pain syndrome (IC/BPS) and comorbid conditions. [employer]. [employer] Int. 2012 Jan 11.    For irritative bladder we reviewed treatment options including altering her diet to avoid irritative beverages and foods as well as attempting to decrease stress and other exacerbating factors.  We also discussed using pyridium and similar over-the-counter medications for pain relief as needed. We discussed the pentad of medications including [place], Tums, an antihistamine such as Vistaril, amitriptyline, and L-arginine.  We also discussed in-office bladder instillations for pain flares, as well as cystoscopy with hydrodistention in the operating room, which can be both diagnostic and therapeutic. She was also given information on the IC Network at https://www.ic-network.com for  bladder diet suggestions and patient forums for support.

## 2024-01-08 ENCOUNTER — Ambulatory Visit: Payer: Managed Care, Other (non HMO)

## 2024-01-15 ENCOUNTER — Other Ambulatory Visit: Payer: Self-pay | Admitting: Obstetrics and Gynecology

## 2024-01-15 ENCOUNTER — Other Ambulatory Visit (HOSPITAL_COMMUNITY): Payer: Self-pay

## 2024-01-15 DIAGNOSIS — N3289 Other specified disorders of bladder: Secondary | ICD-10-CM

## 2024-01-16 ENCOUNTER — Other Ambulatory Visit: Payer: Self-pay | Admitting: Family Medicine

## 2024-01-16 DIAGNOSIS — Z1231 Encounter for screening mammogram for malignant neoplasm of breast: Secondary | ICD-10-CM

## 2024-01-24 ENCOUNTER — Other Ambulatory Visit (HOSPITAL_COMMUNITY): Payer: Self-pay

## 2024-01-24 ENCOUNTER — Other Ambulatory Visit: Payer: Self-pay

## 2024-01-25 ENCOUNTER — Encounter: Payer: Self-pay | Admitting: Obstetrics

## 2024-01-25 ENCOUNTER — Ambulatory Visit: Payer: Managed Care, Other (non HMO) | Admitting: Obstetrics

## 2024-01-25 ENCOUNTER — Other Ambulatory Visit (HOSPITAL_COMMUNITY): Payer: Self-pay

## 2024-01-25 VITALS — BP 128/84 | HR 69

## 2024-01-25 DIAGNOSIS — Z9889 Other specified postprocedural states: Secondary | ICD-10-CM | POA: Insufficient documentation

## 2024-01-25 DIAGNOSIS — R102 Pelvic and perineal pain: Secondary | ICD-10-CM

## 2024-01-25 DIAGNOSIS — R350 Frequency of micturition: Secondary | ICD-10-CM | POA: Diagnosis not present

## 2024-01-25 DIAGNOSIS — Z48816 Encounter for surgical aftercare following surgery on the genitourinary system: Secondary | ICD-10-CM

## 2024-01-25 LAB — POCT URINALYSIS DIPSTICK
Bilirubin, UA: NEGATIVE
Blood, UA: NEGATIVE
Glucose, UA: NEGATIVE
Ketones, UA: NEGATIVE
Nitrite, UA: NEGATIVE
Protein, UA: NEGATIVE
Spec Grav, UA: 1.02 (ref 1.010–1.025)
Urobilinogen, UA: 0.2 U/dL
pH, UA: 6.5 (ref 5.0–8.0)

## 2024-01-25 MED ORDER — CHLORTHALIDONE 25 MG PO TABS
12.5000 mg | ORAL_TABLET | Freq: Every day | ORAL | 0 refills | Status: DC
  Filled 2024-01-25: qty 45, 90d supply, fill #0

## 2024-01-25 MED ORDER — AMITRIPTYLINE HCL 25 MG PO TABS
25.0000 mg | ORAL_TABLET | Freq: Every day | ORAL | 0 refills | Status: DC
  Filled 2024-01-25: qty 30, 30d supply, fill #0

## 2024-01-25 MED ORDER — LOSARTAN POTASSIUM 50 MG PO TABS
50.0000 mg | ORAL_TABLET | Freq: Every day | ORAL | 0 refills | Status: DC
  Filled 2024-01-25: qty 90, 90d supply, fill #0

## 2024-01-25 MED ORDER — MIRABEGRON ER 50 MG PO TB24
50.0000 mg | ORAL_TABLET | Freq: Every day | ORAL | 2 refills | Status: DC
  Filled 2024-01-25 – 2024-02-29 (×2): qty 30, 30d supply, fill #0

## 2024-01-25 MED ORDER — MIRABEGRON ER 25 MG PO TB24
25.0000 mg | ORAL_TABLET | Freq: Every day | ORAL | 0 refills | Status: DC
Start: 2024-01-25 — End: 2024-05-17
  Filled 2024-01-25: qty 30, 30d supply, fill #0

## 2024-01-25 NOTE — Progress Notes (Signed)
CYSTOSCOPY  CC:  This is a 58 y.o. with  urinary frequency and dysuria s/p midurethral sling  who presents today for cystoscopy.  Denies relief of urinary frequency with Gemtesa Reports some relief with amitriptyline, desires to increase dose due to improved sleep.  Baseline DO prior to surgery leaks with urgency 1-2 x/week.  Day time voids 11-20 times per day.  Nocturia: 3-7 times per night to void.    Lab Results  Component Value Date   COLORU Yellow 01/25/2024   CLARITYU Clear 01/25/2024   GLUCOSEUR Negative 01/25/2024   BILIRUBINUR Negative 01/25/2024   KETONESU Negative 01/25/2024   SPECGRAV 1.020 01/25/2024   RBCUR Negative 01/25/2024   PHUR 6.5 01/25/2024   PROTEINUR Negative 01/25/2024   UROBILINOGEN 0.2 01/25/2024   LEUKOCYTESUR Trace (A) 01/25/2024     Today's Vitals   01/25/24 1130  BP: 128/84  Pulse: 69   There is no height or weight on file to calculate BMI.   BP 128/84   Pulse 69   LMP 09/12/2019 (Approximate)   CYSTOSCOPY: A time out was performed.  The periurethral area was prepped and draped in a sterile manner.  2% lidocaine jetpack was inserted at the urethral meatus and the urethra and bladder visualized with 12- and 70-degree scopes.  She had normal urethral coaptation and normal urethral mucosa.  She had normal bladder mucosa. She had bilateral clear efflux from both ureteral orifices.  She had no squamous metaplasia at the trigone, no trabeculations, cellules or diverticuli.    Straight Catheterization Procedure for PVR: After verbal consent was obtained from the patient for catheterization to assess bladder emptying and residual volume the urethra and surrounding tissues were prepped with betadine and an in and out catheterization was performed.  PVR was postvoid.  The patient tolerated the procedure well.  ASSESSMENT:  58 y.o. with  urinary frequency after midurethral sling . Cystoscopy today is normal with no foreign material noted in the  bladder or urethra.  PLAN:  Follow-up to discuss findings and treatment options.  All questions answered and post-procedures instructions were given - PVR WNL, denies sensation of incomplete emptying. Discussed need for reassessment if clinical change and repeat urodynamics. Will hold off at this time however offered to patient.  - switch from Gemtesa to mirabegron, advised pt to monitor BP and increase to 50mg  if BP WNL.  - We discussed the symptoms of overactive bladder (OAB), which include urinary urgency, urinary frequency, nocturia, with or without urge incontinence.  While we do not know the exact etiology of OAB, several treatment options exist. We discussed management including behavioral therapy (decreasing bladder irritants, urge suppression strategies, timed voids, bladder retraining), physical therapy, medication; for refractory cases posterior tibial nerve stimulation, sacral neuromodulation, and intravesical botulinum toxin injection.  - For Beta-3 agonist medication, we discussed the potential side effect of elevated blood pressure which is more likely to occur in individuals with uncontrolled hypertension. - For refractory OAB we reviewed the procedure for intravesical Botox injection with cystoscopy in the office and reviewed the risks, benefits and alternatives of treatment including but not limited to infection, need for self-catheterization and need for repeat therapy.  We discussed that there is a 5-15% chance of needing to catheterize with Botox and that this usually resolves in a few months; however can persist for longer periods of time.  Typically Botox injections would need to be repeated every 3-12 months since this is not a permanent therapy.   We discussed  the role of sacral neuromodulation and how it works. It requires a test phase, and documentation of bladder function via diary. After a successful test period, a permanent wire and generator are placed in the OR. The  battery lasts 5 years on average and would need to be replaced surgically.  The goal of this therapy is at least a 50% improvement in symptoms. It is NOT realistic to expect a 100% cure.  We reviewed the fact that about 30% of patients fail the test phase and are not candidates for permanent generator placement.  We discussed the risk of infection and that the patient would not be able to get an MRI once the device is placed. There are two companies that provide this therapy: [employer] and [employer]. [employer]' product is new and is similar to [employer]'s, but has advantages of a smaller and rechargeable battery and being able to have an MRI with the implant. For all procedures, we discussed risks of bleeding, infection, damage to surrounding organs including bowel, bladder, blood vessels, ureters and nerves, need for further surgery, risk of postoperative urinary incontinence or retention with need to catheterize, recurrent prolapse, numbness and weakness at any body site, buttock pain, and the rarer risks of blood clot, heart attack, pneumonia, death.    We also discussed the role of percutaneous tibial nerve stimulation and how it works.  She understands it requires 12 weekly visits for temporary neuromodulation of the sacral nerve roots via the tibial nerve and that she may then require continued tapered treatment.  She will return for the procedure. All questions were answered. - referral to pelvic floor PT due to baseline myofascial pelvic pain - continue miralax daily for bowel consistency - some improvement of urinary symptoms and sleep on Amitriptyline, pt desires to increase dose. Discussed slow titration while monitoring for side effects. Rx changed to 25mg  daily, increase to 50mg  if no daytime sedation, palpitations, worsening urinary symptoms, significant mood changes.  - encouraged pt to review medications such as anti-depressant and consider psychotherapy with PCP for long term management.  -  discussed bladder pain syndrome treatments if Amitriptyline provides relief.  For irritative bladder we reviewed treatment options including altering her diet to avoid irritative beverages and foods as well as attempting to decrease stress and other exacerbating factors.  We also discussed using pyridium and similar over-the-counter medications for pain relief as needed. We discussed the pentad of medications including Tums, an antihistamine such as Vistaril, amitriptyline, and L-arginine.  We also discussed in-office bladder instillations for pain flares, as well as cystoscopy with hydrodistention in the operating room, which can be both diagnostic and therapeutic. She was also given information on the [employer] at https://www.ic-network.com for bladder diet suggestions and patient forums for support.   Loleta Chance, MD

## 2024-01-25 NOTE — Patient Instructions (Addendum)
We discussed the symptoms of overactive bladder (OAB), which include urinary urgency, urinary frequency, night-time urination, with or without urge incontinence.  We discussed management including behavioral therapy (decreasing bladder irritants by following a bladder diet, urge suppression strategies, timed voids, bladder retraining), physical therapy, medication; and for refractory cases posterior tibial nerve stimulation, sacral neuromodulation, and intravesical botulinum toxin injection.   For Beta-3 agonist medication,there is a potential side effect of elevated blood pressure which is more likely to occur in individuals with uncontrolled hypertension. It appears that your most recent blood pressure is within normal limits 120s/80s. Please monitor your blood pressure and stop the medication if you experience any headache, chest discomfort, or shortness of breath and seek care immediately.  I have sent your prescription of mirabegron to your pharmacy. Start at 25mg  daily for 1 month, if your blood pressure remains unchanged, increase to 50mg  after 1 month and continue to monitor your blood pressure.  For refractory OAB we reviewed the procedure for intravesical Botox injection with cystoscopy in the office and reviewed the risks, benefits and alternatives of treatment including but not limited to infection, need for self-catheterization and need for repeat therapy.  We discussed that there is a 5-15% chance of needing to catheterize with Botox and that this usually resolves in a few months; however can persist for longer periods of time.  Typically Botox injections would need to be repeated every 3-12 months since this is not a permanent therapy.   We discussed the role of sacral neuromodulation and how it works. It requires a test phase, and documentation of bladder function via diary. After a successful test period, a permanent wire and generator are placed in the OR. The battery lasts 5 years on  average and would need to be replaced surgically.  The goal of this therapy is at least a 50% improvement in symptoms. It is NOT realistic to expect a 100% cure.  We reviewed the fact that about 30% of patients fail the test phase and are not candidates for permanent generator placement.  We discussed the risk of infection and that the patient would not be able to get an MRI once the device is placed. There are two companies that provide this therapy: [employer] and [employer]. [employer]' product is new and is similar to [employer]'s, but has advantages of a smaller and rechargeable battery and being able to have an MRI with the implant. For all procedures, we discussed risks of bleeding, infection, damage to surrounding organs including bowel, bladder, blood vessels, ureters and nerves, need for further surgery, risk of postoperative urinary incontinence or retention with need to catheterize, recurrent prolapse, numbness and weakness at any body site, buttock pain, and the rarer risks of blood clot, heart attack, pneumonia, death.    We also discussed the role of percutaneous tibial nerve stimulation and how it works.  She understands it requires 12 weekly visits for temporary neuromodulation of the sacral nerve roots via the tibial nerve and that she may then require continued tapered treatment.  She will return for the procedure. All questions were answered.   Continue Amitriptyline, increase to 20mg  daily if you do not experience daytime sedation. Can increase to 30mg  daily in 3-4 days.

## 2024-02-20 ENCOUNTER — Other Ambulatory Visit (HOSPITAL_COMMUNITY): Payer: Self-pay

## 2024-02-29 ENCOUNTER — Ambulatory Visit: Payer: Managed Care, Other (non HMO) | Admitting: Obstetrics and Gynecology

## 2024-02-29 ENCOUNTER — Other Ambulatory Visit (HOSPITAL_COMMUNITY): Payer: Self-pay

## 2024-02-29 ENCOUNTER — Ambulatory Visit
Admission: RE | Admit: 2024-02-29 | Discharge: 2024-02-29 | Disposition: A | Discharge status: Home / Self Care | Payer: Managed Care, Other (non HMO) | Source: Ambulatory Visit | Attending: Family Medicine | Admitting: Family Medicine

## 2024-02-29 ENCOUNTER — Encounter: Payer: Self-pay | Admitting: Obstetrics and Gynecology

## 2024-02-29 VITALS — BP 110/75 | HR 83

## 2024-02-29 DIAGNOSIS — Z1231 Encounter for screening mammogram for malignant neoplasm of breast: Secondary | ICD-10-CM

## 2024-02-29 DIAGNOSIS — N3281 Overactive bladder: Secondary | ICD-10-CM

## 2024-02-29 DIAGNOSIS — N3289 Other specified disorders of bladder: Secondary | ICD-10-CM

## 2024-02-29 DIAGNOSIS — R350 Frequency of micturition: Secondary | ICD-10-CM | POA: Diagnosis not present

## 2024-02-29 NOTE — Progress Notes (Signed)
 [facility] Return Visit  SUBJECTIVE  History of Present Illness: Samantha Peck is a 58 y.o. female seen in follow-up for OAB/UUI. Plan at last visit was patient to increase Myrbetriq to 50mg  if BP stable and start PT. Consider options of PTNS and bladder botox.    Patient reports there was miscommunication in her increasing to the 50. She is scheduled for PT in May.   Patient reported the Amitriptyline is not helping and she is planning to discontinue this. Has previously tried Hydroxyzine as well.    Past Medical History: Patient  has a past medical history of Graves' disease in remission (2010), History of nonmelanoma skin cancer, Hypertension, 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 pelvic organs, and SUI (stress urinary incontinence, female).   Past Surgical History: She  has a past surgical history that includes Bunionectomy (Bilateral, 1996); Xi robotic assisted total hysterectomy with sacrocolpopexy (N/A, 11/20/2023); Rectocele repair (N/A, 11/20/2023); Bladder suspension (N/A, 11/20/2023); Cystoscopy (N/A, 11/20/2023); Laparoscopic lysis of adhesions (N/A, 11/20/2023); and Breast biopsy (Right, 01/02/2008).   Medications: She has a current medication list which includes the following prescription(s): amitriptyline, chlorthalidone, cimetidine, losartan, methimazole, mirabegron er, mirabegron er, nitrofurantoin (macrocrystal-monohydrate), oxycodone, phenazopyridine, phenazopyridine, and polyethylene glycol powder.   Allergies: Patient has no known allergies.   Social History: Patient  reports that she has quit smoking. Her smoking use included cigarettes. She started smoking about 37 years ago. She has never used smokeless tobacco. She reports that she does not currently use alcohol. She reports that she does not use drugs.     OBJECTIVE     Physical Exam: Vitals:   02/29/24 1257  BP: 110/75  Pulse: 83   Gen: No apparent distress, A&O x 3.  Detailed Urogynecologic  Evaluation:  Deferred.    ASSESSMENT AND PLAN    Ms. [name] is a 58 y.o. with:  1. Urinary frequency   2. Bladder irritation   3. OAB (overactive bladder)    Increase Myrbetriq to 50mg  daily. If this is not helpful we will consider bladder botox or PTNS. She is not currently functional going to the bathroom 3 times per hour. Most nights she is up 4 times to urinate.  Patient reports bladder installations are not helpful and after she urinated all the symptoms returned. Patient to start Pelvic floor PT in May and follow up with Dr. Olena Leatherwood after. If not significantly improved will discuss botox or PTNS further.   Patient to return in 3 months to meet with Dr. Olena Leatherwood or sooner if needed.   Selmer Dominion, NP

## 2024-02-29 NOTE — Patient Instructions (Addendum)
 Maybe consider Lexapro or Zoloft for anxiety and depression  Start the 50mg  Myrbetriq.   If you start having loss of stool take some fiber supplementation.   Let me know if you want a referral for one of our therapy providers. I will send it over for counseling or mental support.

## 2024-04-16 ENCOUNTER — Other Ambulatory Visit: Payer: Self-pay

## 2024-04-16 ENCOUNTER — Other Ambulatory Visit (HOSPITAL_COMMUNITY): Payer: Self-pay

## 2024-04-16 MED ORDER — CHLORTHALIDONE 25 MG PO TABS
12.5000 mg | ORAL_TABLET | Freq: Every day | ORAL | 0 refills | Status: DC
  Filled 2024-04-16 (×2): qty 45, 90d supply, fill #0

## 2024-04-16 MED ORDER — LOSARTAN POTASSIUM 50 MG PO TABS
50.0000 mg | ORAL_TABLET | Freq: Every day | ORAL | 0 refills | Status: DC
  Filled 2024-04-16 (×2): qty 90, 90d supply, fill #0

## 2024-04-25 ENCOUNTER — Encounter: Payer: Managed Care, Other (non HMO) | Attending: Obstetrics | Admitting: Physical Therapy

## 2024-04-25 ENCOUNTER — Encounter: Payer: Self-pay | Admitting: Physical Therapy

## 2024-04-25 ENCOUNTER — Other Ambulatory Visit: Payer: Self-pay

## 2024-04-25 DIAGNOSIS — R3915 Urgency of urination: Secondary | ICD-10-CM | POA: Insufficient documentation

## 2024-04-25 DIAGNOSIS — R278 Other lack of coordination: Secondary | ICD-10-CM | POA: Insufficient documentation

## 2024-04-25 DIAGNOSIS — M6281 Muscle weakness (generalized): Secondary | ICD-10-CM | POA: Insufficient documentation

## 2024-04-25 NOTE — Therapy (Signed)
 OUTPATIENT PHYSICAL THERAPY FEMALE PELVIC EVALUATION   Patient Name: Samantha Peck MRN: [id number] DOB:1966-03-07, 58 y.o., female Today's Date: 04/25/2024  END OF SESSION:  PT End of Session - 04/25/24 0944     Visit Number 1    Date for PT Re-Evaluation 10/26/24    Authorization Type [employer]    PT Start Time 0930    PT Stop Time 1015    PT Time Calculation (min) 45 min    Activity Tolerance Patient tolerated treatment well    Behavior During Therapy [facility] for tasks assessed/performed             Past Medical History:  Diagnosis Date   Graves' disease in remission 2010   endocrinologist--- dr Aldona Amel   History of nonmelanoma skin cancer    skin   Hypertension    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 pelvic organs    SUI (stress urinary incontinence, female)    Past Surgical History:  Procedure Laterality Date   BLADDER SUSPENSION N/A 11/20/2023   Procedure: TRANSVAGINAL TAPE (TVT) PROCEDURE;  Surgeon: Darlene Ehlers, MD;  Location: [facility];  Service: Gynecology;  Laterality: N/A;   BREAST BIOPSY Right 01/02/2008   BUNIONECTOMY Bilateral 1996   CYSTOSCOPY N/A 11/20/2023   Procedure: CYSTOSCOPY;  Surgeon: Darlene Ehlers, MD;  Location: [facility];  Service: Gynecology;  Laterality: N/A;   LAPAROSCOPIC LYSIS OF ADHESIONS N/A 11/20/2023   Procedure: LAPAROSCOPIC LYSIS OF ADHESIONS;  Surgeon: Darlene Ehlers, MD;  Location: [facility];  Service: Gynecology;  Laterality: N/A;   RECTOCELE REPAIR N/A 11/20/2023   Procedure: PERINEORRHAPHY;  Surgeon: Darlene Ehlers, MD;  Location: [facility];  Service: Gynecology;  Laterality: N/A;   XI ROBOTIC ASSISTED TOTAL HYSTERECTOMY WITH SACROCOLPOPEXY N/A 11/20/2023   Procedure: XI ROBOTIC ASSISTED TOTAL HYSTERECTOMY WITH BILATERAL SALPINGECTOMY AND SACROCOLPOPEXY;  Surgeon: Darlene Ehlers, MD;  Location: [facility];  Service: Gynecology;  Laterality: N/A;   Patient Active Problem List    Diagnosis Date Noted   History of pelvic surgery 01/25/2024   Constipation 01/01/2024   Urinary frequency 01/01/2024   Pelvic pain 01/01/2024   Pelvic organ prolapse quantification stage 3 rectocele 11/20/2023   Sprain of unspecified ligament of right ankle, initial encounter 09/19/2022   Contusion of left knee 09/19/2022   Weight gain 04/23/2018   Graves disease 07/13/2012    PCP: Candiss Chamorro, MD  REFERRING PROVIDER: Darlene Ehlers, MD   REFERRING DIAG:  R35.0 (ICD-10-CM) - Urinary frequency  [phone] (ICD-10-CM) - History of pelvic surgery  R10.2 (ICD-10-CM) - Pelvic pain    THERAPY DIAG:  Muscle weakness (generalized)  Other lack of coordination  Urinary urgency  Rationale for Evaluation and Treatment: Rehabilitation  ONSET DATE: 11/20/23  SUBJECTIVE:  SUBJECTIVE STATEMENT:  s/p surgery for pelvic organ prolapse and stress urinary incontinence but her issues have gotten worse since the surgery. Works from home due to having to urinate frequently and not able to make it to the bathroom in time.  Fluid intake: soda, coffee, water   PAIN:  Are you having pain? Yes NPRS scale: 2/10 Pain location: low abdominal   Pain type: cramping Pain description: intermittent   Aggravating factors: when she has to have a bowel movement Relieving factors: feels better after a bowel movement  PRECAUTIONS: None  RED FLAGS: None   WEIGHT BEARING RESTRICTIONS: No  FALLS:  Has patient fallen in last 6 months? 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, computer all day  ACTIVITY LEVEL : no exercise since exercise; prior to surgery would walk on treadmill;   PLOF: Independent  PATIENT GOALS: reduce the amount she has to use the bathroom  PERTINENT HISTORY:  ROBOTIC  ASSISTED TOTAL HYSTERECTOMY WITH SACROCOLPOPEXY  11/20/23; BLADDER SUSPENSION 11/20/23; LAPAROSCOPIC LYSIS OF ADHESIONS 11/20/23; rectocele repair 11/20/23; Graves Disease; Hypertension Sexual abuse:  No BOWEL MOVEMENT: Pain with bowel movement: Yes Type of bowel movement:Type (Bristol Stool Scale) type 4 , Frequency daily, Strain no, and Splinting no Fully empty rectum: Yes:   Leakage: No Fiber supplement/laxative No  URINATION: Pain with urination: No Fully empty bladder: Yes: but after she urinates feels like she has to urinate again Stream: Strong Urgency: Yes  Frequency: first thing in the morning has to go to the bathroom frequently; wakes up 3-4 times per night; during the day goes to the bathroom 8 times in an hour Leakage: Urge to void and Walking to the bathroom Pads: No  INTERCOURSE:  Ability to have vaginal penetration Yes  Pain with intercourse: none DrynessNo  PREGNANCY: Vaginal deliveries 3 Tearing Yes: not sure about what kind of tear   PROLAPSE: None   OBJECTIVE:  Note: Objective measures were completed at Evaluation unless otherwise noted.  DIAGNOSTIC FINDINGS:  none  PATIENT SURVEYS:  PFIQ-7: 30 UIQ-7: 86  COGNITION: Overall cognitive status: Within functional limits for tasks assessed     SENSATION: Light touch: Appears intact     POSTURE: rounded shoulders, forward head, decreased lumbar lordosis, and posterior pelvic tilt   LUMBARAROM/PROM:  A/PROM A/PROM  eval  Extension Decreased by 50%   (Blank rows = not tested)  LOWER EXTREMITY ROM:  Passive ROM Right eval Left eval  Hip external rotation 50 50   (Blank rows = not tested)  LOWER EXTREMITY MMT:  MMT Right eval Left eval  Hip extension 4/5 4/5  Hip abduction 3/5 4/5  Hip adduction 4/5 5/5   (Blank rows = not tested) PALPATION:     Abdominal: decreased mobility of the diaphragm; unable to expand her diaphragm, abdomen is firm                External Perineal Exam:  intact                             Internal Pelvic Floor: tenderness located on bilateral iliococcygeus, tightness along the introitus and perineal body  Patient confirms identification and approves PT to assess internal pelvic floor and treatment Yes No emotional/communication barriers or cognitive limitation. Patient is motivated to learn. Patient understands and agrees with treatment goals and plan. PT explains patient will be examined in standing, sitting, and lying down to see how their muscles and joints  work. When they are ready, they will be asked to remove their underwear so PT can examine their perineum. The patient is also given the option of providing their own chaperone as one is not provided in our facility. The patient also has the right and is explained the right to defer or refuse any part of the evaluation or treatment including the internal exam. With the patient's consent, PT will use one gloved finger to gently assess the muscles of the pelvic floor, seeing how well it contracts and relaxes and if there is muscle symmetry. After, the patient will get dressed and PT and patient will discuss exam findings and plan of care. PT and patient discuss plan of care, schedule, attendance policy and HEP activities.   PELVIC MMT:   MMT eval  Vaginal 2/5 holding 5 sec ins supine; standing 2/5  (Blank rows = not tested)        TONE: average  PROLAPSE: none  TODAY'S TREATMENT:                                                                                                                              DATE: 04/25/24  EVAL Examination completed, findings reviewed, pt educated on POC, HEP, and female pelvic floor anatomy, reasoning with pelvic floor assessment internally with pt consent. Pt motivated to participate in PT and agreeable to attempt recommendations.     PATIENT EDUCATION:  04/25/24 Education details: Access Code: [email or web address], educated patient on bladder irritants and suggested  her to reduce her coffee and soda Person educated: Patient Education method: Explanation, Demonstration, Tactile cues, Verbal cues, and Handouts Education comprehension: verbalized understanding, returned demonstration, verbal cues required, tactile cues required, and needs further education  HOME EXERCISE PROGRAM: 04/25/24 Access Code: [email or web address] URL: [email or web address] Date: 04/25/2024 Prepared by: Marsha Skeen  Exercises - Seated Pelvic Floor Contraction  - 3 x daily - 7 x weekly - 1 sets - 10 reps - 5 sec hold - Seated Quick Flick Pelvic Floor Contractions  - 3 x daily - 7 x weekly - 1 sets - 5 reps  ASSESSMENT:  CLINICAL IMPRESSION: Patient is a 59 y.o. female who was seen today for physical therapy evaluation and treatment for pelvic pain, urinary frequency. Patient has a history of ROBOTIC ASSISTED TOTAL HYSTERECTOMY WITH SACROCOLPOPEXY  , BLADDER SUSPENSION , LAPAROSCOPIC LYSIS OF ADHESIONS and rectocele repair 11/20/23. She has pain in the pubic bone area prior to a bowel movement at level 2/10. She will urinate 8 times in 1 hour due to the frequent urge and wake up 5 times per night. She leaks urine with urge to void and walking to the bathroom. Patient has changed her work schedule to work from home due to the urinary frequency and urine leakage as she is walking to the bathroom. She has difficulty expanding the lower rib cage to move the diaphragm and pelvic floor. Pelvic floor strength is 2/5 in supine and standing. She has  tightness along the introitus, perineal body  and tenderness located on the iliococcygeus. Patient stands with a posterior pelvic tilt and decreased lumbar lordosis increasing tension in the pelvic floor. Patient will benefit from skilled therapy to reduce urinary urgency, urinary leakage and strength.   OBJECTIVE IMPAIRMENTS: decreased activity tolerance, decreased coordination, decreased strength, increased fascial restrictions, and pain.    ACTIVITY LIMITATIONS: continence and locomotion level  PARTICIPATION LIMITATIONS: meal prep, cleaning, laundry, driving, shopping, community activity, and occupation  PERSONAL FACTORS: Age, Time since onset of injury/illness/exacerbation, and 1-2 comorbidities: ROBOTIC ASSISTED TOTAL HYSTERECTOMY WITH SACROCOLPOPEXY  11/20/23; BLADDER SUSPENSION 11/20/23; LAPAROSCOPIC LYSIS OF ADHESIONS 11/20/23; rectocele repair 11/20/23; Graves Disease; Hypertension are also affecting patient's functional outcome.   REHAB POTENTIAL: Excellent  CLINICAL DECISION MAKING: Evolving/moderate complexity  EVALUATION COMPLEXITY: Moderate   GOALS: Goals reviewed with patient? Yes  SHORT TERM GOALS: Target date: 05/23/24  Patient independent with initial HEP for pelvic floor strength.  Baseline: Goal status: INITIAL  2.  Patient is able to perform diaphragmatic breathing to relax the pelvic floor when she has the urge to void.  Baseline:  Goal status: INITIAL  3.  Patient educated on the urge to void behavioral technique to delay the urge to urinate 15 minutes.  Baseline:  Goal status: INITIAL  4.  Patient understands what bladder irritants are and how they affect the urgency.  Baseline:  Goal status: INITIAL  LONG TERM GOALS: Target date: 10/26/24  Patient independent with advanced HEP for pelvic floor and core strength to reduce urinary urgency and leakage.  Baseline:  Goal status: INITIAL  2.  Patient is able to have the urge to void and not leak urine on the floor as she is walking to the bathroom.  Baseline:  Goal status: INITIAL  3.  Patient is able to wait 2 hours prior to going to the bathroom due to urinary urgency decreased >/= 80% so she is able to work without disruption.  Baseline:  Goal status: INITIAL  4.  Patient is able to reduce her urinary leakage as she is walking to reduce the chance of her slipping on urine due to pelvic floor strength >/= 3/5.  Baseline:  Goal status:  INITIAL  5.  Patient is able to wake up less than 1-2 times per night so she is able to get a full nights sleep.  Baseline:  Goal status: INITIAL   PLAN:  PT FREQUENCY: 1-2x/week  PT DURATION: 6 months  PLANNED INTERVENTIONS: 97110-Therapeutic exercises, 97530- Therapeutic activity, 97112- Neuromuscular re-education, 97535- Self Care, 16109- Manual therapy, [phone]- Electrical stimulation (manual), Patient/Family education, Dry Needling, Joint mobilization, Spinal mobilization, Cryotherapy, Moist heat, and Biofeedback  PLAN FOR NEXT SESSION: manual work to abdomen and diaphragm to work on diaphragmatic breathing, core engagement, manual work to pelvic floor to improve contraction, hip stretches for ER   Marsha Skeen, PT 04/25/24 1:06 PM

## 2024-04-25 NOTE — Patient Instructions (Signed)

## 2024-04-30 ENCOUNTER — Other Ambulatory Visit: Payer: Self-pay | Admitting: Internal Medicine

## 2024-04-30 ENCOUNTER — Other Ambulatory Visit (HOSPITAL_COMMUNITY): Payer: Self-pay

## 2024-04-30 DIAGNOSIS — E0500 Thyrotoxicosis with diffuse goiter without thyrotoxic crisis or storm: Secondary | ICD-10-CM

## 2024-04-30 MED ORDER — METHIMAZOLE 5 MG PO TABS
7.5000 mg | ORAL_TABLET | Freq: Every day | ORAL | 0 refills | Status: DC
  Filled 2024-04-30: qty 30, 20d supply, fill #0

## 2024-05-02 ENCOUNTER — Encounter: Payer: Self-pay | Admitting: Physical Therapy

## 2024-05-02 ENCOUNTER — Encounter: Payer: Managed Care, Other (non HMO) | Admitting: Physical Therapy

## 2024-05-02 DIAGNOSIS — M6281 Muscle weakness (generalized): Secondary | ICD-10-CM

## 2024-05-02 DIAGNOSIS — R278 Other lack of coordination: Secondary | ICD-10-CM

## 2024-05-02 DIAGNOSIS — R3915 Urgency of urination: Secondary | ICD-10-CM

## 2024-05-02 NOTE — Therapy (Signed)
 OUTPATIENT PHYSICAL THERAPY FEMALE PELVIC EVALUATION   Patient Name: Samantha Peck MRN: [id number] DOB:May 22, 1966, 58 y.o., female Today's Date: 05/02/2024  END OF SESSION:  PT End of Session - 05/02/24 0918     Visit Number 2    Date for PT Re-Evaluation 10/26/24    Authorization Type [employer]    Authorization - Visit Number 2    Authorization - Number of Visits 30    PT Start Time 0915    PT Stop Time 1015    PT Time Calculation (min) 60 min    Activity Tolerance Patient tolerated treatment well    Behavior During Therapy [facility] for tasks assessed/performed             Past Medical History:  Diagnosis Date   Graves' disease in remission 2010   endocrinologist--- dr Aldona Amel   History of nonmelanoma skin cancer    skin   Hypertension    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 pelvic organs    SUI (stress urinary incontinence, female)    Past Surgical History:  Procedure Laterality Date   BLADDER SUSPENSION N/A 11/20/2023   Procedure: TRANSVAGINAL TAPE (TVT) PROCEDURE;  Surgeon: Darlene Ehlers, MD;  Location: [facility];  Service: Gynecology;  Laterality: N/A;   BREAST BIOPSY Right 01/02/2008   BUNIONECTOMY Bilateral 1996   CYSTOSCOPY N/A 11/20/2023   Procedure: CYSTOSCOPY;  Surgeon: Darlene Ehlers, MD;  Location: [facility];  Service: Gynecology;  Laterality: N/A;   LAPAROSCOPIC LYSIS OF ADHESIONS N/A 11/20/2023   Procedure: LAPAROSCOPIC LYSIS OF ADHESIONS;  Surgeon: Darlene Ehlers, MD;  Location: [facility];  Service: Gynecology;  Laterality: N/A;   RECTOCELE REPAIR N/A 11/20/2023   Procedure: PERINEORRHAPHY;  Surgeon: Darlene Ehlers, MD;  Location: [facility];  Service: Gynecology;  Laterality: N/A;   XI ROBOTIC ASSISTED TOTAL HYSTERECTOMY WITH SACROCOLPOPEXY N/A 11/20/2023   Procedure: XI ROBOTIC ASSISTED TOTAL HYSTERECTOMY WITH BILATERAL SALPINGECTOMY AND SACROCOLPOPEXY;  Surgeon: Darlene Ehlers, MD;  Location: [facility];  Service: Gynecology;  Laterality: N/A;   Patient Active Problem List   Diagnosis Date Noted   History of pelvic surgery 01/25/2024   Constipation 01/01/2024   Urinary frequency 01/01/2024   Pelvic pain 01/01/2024   Pelvic organ prolapse quantification stage 3 rectocele 11/20/2023   Sprain of unspecified ligament of right ankle, initial encounter 09/19/2022   Contusion of left knee 09/19/2022   Weight gain 04/23/2018   Graves disease 07/13/2012    PCP: Candiss Chamorro, MD  REFERRING PROVIDER: Darlene Ehlers, MD   REFERRING DIAG:  R35.0 (ICD-10-CM) - Urinary frequency  [phone] (ICD-10-CM) - History of pelvic surgery  R10.2 (ICD-10-CM) - Pelvic pain    THERAPY DIAG:  Muscle weakness (generalized)  Other lack of coordination  Urinary urgency  Rationale for Evaluation and Treatment: Rehabilitation  ONSET DATE: 11/20/23  SUBJECTIVE:  SUBJECTIVE STATEMENT: I am able to contract the pelvic floor and make it to the bathroom. I still have to urinate a lot especially in the morning.  Fluid intake: soda, coffee, water   PAIN:  Are you having pain? Yes NPRS scale: 2/10 Pain location: low abdominal   Pain type: cramping Pain description: intermittent   Aggravating factors: when she has to have a bowel movement Relieving factors: feels better after a bowel movement  PRECAUTIONS: None  RED FLAGS: None   WEIGHT BEARING RESTRICTIONS: No  FALLS:  Has patient fallen in last 6 months? 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, computer all day  ACTIVITY LEVEL : no exercise since exercise; prior to surgery would walk on treadmill;   PLOF: Independent  PATIENT GOALS: reduce the amount she has to use the bathroom  PERTINENT HISTORY:  ROBOTIC ASSISTED TOTAL  HYSTERECTOMY WITH SACROCOLPOPEXY  11/20/23; BLADDER SUSPENSION 11/20/23; LAPAROSCOPIC LYSIS OF ADHESIONS 11/20/23; rectocele repair 11/20/23; Graves Disease; Hypertension Sexual abuse:  No BOWEL MOVEMENT: Pain with bowel movement: Yes Type of bowel movement:Type (Bristol Stool Scale) type 4 , Frequency daily, Strain no, and Splinting no Fully empty rectum: Yes:   Leakage: No Fiber supplement/laxative No  URINATION: Pain with urination: No Fully empty bladder: Yes: but after she urinates feels like she has to urinate again Stream: Strong Urgency: Yes  Frequency: first thing in the morning has to go to the bathroom frequently; wakes up 3-4 times per night; during the day goes to the bathroom 8 times in an hour Leakage: Urge to void and Walking to the bathroom Pads: No  INTERCOURSE:  Ability to have vaginal penetration Yes  Pain with intercourse: none DrynessNo  PREGNANCY: Vaginal deliveries 3 Tearing Yes: not sure about what kind of tear   PROLAPSE: None   OBJECTIVE:  Note: Objective measures were completed at Evaluation unless otherwise noted.  DIAGNOSTIC FINDINGS:  none  PATIENT SURVEYS:  PFIQ-7: 30 UIQ-7: 86  COGNITION: Overall cognitive status: Within functional limits for tasks assessed     SENSATION: Light touch: Appears intact     POSTURE: rounded shoulders, forward head, decreased lumbar lordosis, and posterior pelvic tilt   LUMBARAROM/PROM:  A/PROM A/PROM  eval  Extension Decreased by 50%   (Blank rows = not tested)  LOWER EXTREMITY ROM:  Passive ROM Right eval Left eval  Hip external rotation 50 50   (Blank rows = not tested)  LOWER EXTREMITY MMT:  MMT Right eval Left eval  Hip extension 4/5 4/5  Hip abduction 3/5 4/5  Hip adduction 4/5 5/5   (Blank rows = not tested) PALPATION:     Abdominal: decreased mobility of the diaphragm; unable to expand her diaphragm, abdomen is firm                External Perineal Exam: intact                              Internal Pelvic Floor: tenderness located on bilateral iliococcygeus, tightness along the introitus and perineal body  Patient confirms identification and approves PT to assess internal pelvic floor and treatment Yes No emotional/communication barriers or cognitive limitation. Patient is motivated to learn. Patient understands and agrees with treatment goals and plan. PT explains patient will be examined in standing, sitting, and lying down to see how their muscles and joints work. When they are ready, they will be asked to remove their underwear so PT  can examine their perineum. The patient is also given the option of providing their own chaperone as one is not provided in our facility. The patient also has the right and is explained the right to defer or refuse any part of the evaluation or treatment including the internal exam. With the patient's consent, PT will use one gloved finger to gently assess the muscles of the pelvic floor, seeing how well it contracts and relaxes and if there is muscle symmetry. After, the patient will get dressed and PT and patient will discuss exam findings and plan of care. PT and patient discuss plan of care, schedule, attendance policy and HEP activities.   PELVIC MMT:   MMT eval  Vaginal 2/5 holding 5 sec ins supine; standing 2/5  (Blank rows = not tested)        TONE: average  PROLAPSE: none  TODAY'S TREATMENT:     05/02/24 Manual: Soft tissue mobilization: Manual work to the abdomen, diaphragm, quadratus, lateral intercostals,  Myofascial release: Tissue rolling along the lateral and anterior rib cage Neuromuscular re-education: Form correction: Self diaphragm stretch in sitting to assist with diaphragmatic breathing Open book with elongating the rib cage and contracting the abdominals Educated patient on standing tall and walking with heel strike to elongate the anterior trunk due to her walking in a flexed  posture Exercises: Stretches/mobility: Sitting piriformis stretch holding 30 sec bil.  Sitting hamstring stretch holding 30 sec bil.  Sitting hip adductor stretch holding 30 sec bil.  Earline Glenn pose with diaphragmatic breathing Prone press up 10 x to elongate anterior trunk                                                                                                                             DATE: 04/25/24  EVAL Examination completed, findings reviewed, pt educated on POC, HEP, and female pelvic floor anatomy, reasoning with pelvic floor assessment internally with pt consent. Pt motivated to participate in PT and agreeable to attempt recommendations.     PATIENT EDUCATION:  05/02/24 Education details: Access Code: [email or web address], educated patient on bladder irritants and suggested her to reduce her coffee and soda Person educated: Patient Education method: Explanation, Demonstration, Tactile cues, Verbal cues, and Handouts Education comprehension: verbalized understanding, returned demonstration, verbal cues required, tactile cues required, and needs further education  HOME EXERCISE PROGRAM: 05/02/24 Access Code: [email or web address] URL: [email or web address] Date: 05/02/2024 Prepared by: Marsha Skeen  Exercises - Seated Pelvic Floor Contraction  - 3 x daily - 7 x weekly - 1 sets - 10 reps - 5 sec hold - Seated Quick Flick Pelvic Floor Contractions  - 3 x daily - 7 x weekly - 1 sets - 5 reps - Sidelying Thoracic Rotation with Open Book  - 1 x daily - 7 x weekly - 1 sets - 10 reps - Prone Press Up  - 1 x daily - 7 x weekly - 1 sets - 10 reps - Diaphragmatic Breathing in Child's  Pose with Pelvic Floor Relaxation  - 1 x daily - 7 x weekly - 1 sets - 2 reps - 30 sec hold - Seated Piriformis Stretch with Trunk Bend  - 1 x daily - 7 x weekly - 1 sets - 2 reps - 30 sec hold - Seated Hamstring Stretch  - 1 x daily - 7 x weekly - 1 sets - 2 reps - 30 sec hold - Seated Hip Adductor Stretch  - 1  x daily - 7 x weekly - 1 sets - 2 reps - 30 sec hold   ASSESSMENT:  CLINICAL IMPRESSION: Patient is a 57 y.o. female who was seen today for physical therapy treatment for pelvic pain, urinary frequency. Patient has a history of ROBOTIC ASSISTED TOTAL HYSTERECTOMY WITH SACROCOLPOPEXY  , BLADDER SUSPENSION , LAPAROSCOPIC LYSIS OF ADHESIONS and rectocele repair 11/20/23.  Patient is able to contract her pelvic floor and walk to the bathroom. Her urinary urgency is more in the morning. She has increased trigger points in the abdomen and tightness pulling her forward. She is learning how to perform diaphragmatic breath and walking tall to elongate the anterior trunk. She still has difficulty with expanding the lower rib cage and performing diaphragmatic breathing to elongate the pelvic floor.  Patient will benefit from skilled therapy to reduce urinary urgency, urinary leakage and strength.   OBJECTIVE IMPAIRMENTS: decreased activity tolerance, decreased coordination, decreased strength, increased fascial restrictions, and pain.   ACTIVITY LIMITATIONS: continence and locomotion level  PARTICIPATION LIMITATIONS: meal prep, cleaning, laundry, driving, shopping, community activity, and occupation  PERSONAL FACTORS: Age, Time since onset of injury/illness/exacerbation, and 1-2 comorbidities: ROBOTIC ASSISTED TOTAL HYSTERECTOMY WITH SACROCOLPOPEXY  11/20/23; BLADDER SUSPENSION 11/20/23; LAPAROSCOPIC LYSIS OF ADHESIONS 11/20/23; rectocele repair 11/20/23; Graves Disease; Hypertension are also affecting patient's functional outcome.   REHAB POTENTIAL: Excellent  CLINICAL DECISION MAKING: Evolving/moderate complexity  EVALUATION COMPLEXITY: Moderate   GOALS: Goals reviewed with patient? Yes  SHORT TERM GOALS: Target date: 05/23/24  Patient independent with initial HEP for pelvic floor strength.  Baseline: Goal status: INITIAL  2.  Patient is able to perform diaphragmatic breathing to relax the pelvic  floor when she has the urge to void.  Baseline:  Goal status: INITIAL  3.  Patient educated on the urge to void behavioral technique to delay the urge to urinate 15 minutes.  Baseline:  Goal status: INITIAL  4.  Patient understands what bladder irritants are and how they affect the urgency.  Baseline:  Goal status: INITIAL  LONG TERM GOALS: Target date: 10/26/24  Patient independent with advanced HEP for pelvic floor and core strength to reduce urinary urgency and leakage.  Baseline:  Goal status: INITIAL  2.  Patient is able to have the urge to void and not leak urine on the floor as she is walking to the bathroom.  Baseline:  Goal status: INITIAL  3.  Patient is able to wait 2 hours prior to going to the bathroom due to urinary urgency decreased >/= 80% so she is able to work without disruption.  Baseline:  Goal status: INITIAL  4.  Patient is able to reduce her urinary leakage as she is walking to reduce the chance of her slipping on urine due to pelvic floor strength >/= 3/5.  Baseline:  Goal status: INITIAL  5.  Patient is able to wake up less than 1-2 times per night so she is able to get a full nights sleep.  Baseline:  Goal status: INITIAL  PLAN:  PT FREQUENCY: 1-2x/week  PT DURATION: 6 months  PLANNED INTERVENTIONS: 97110-Therapeutic exercises, 97530- Therapeutic activity, 97112- Neuromuscular re-education, 97535- Self Care, [id number]- Manual therapy, [phone]- Electrical stimulation (manual), Patient/Family education, Dry Needling, Joint mobilization, Spinal mobilization, Cryotherapy, Moist heat, and Biofeedback  PLAN FOR NEXT SESSION: manual work to abdomen and diaphragm to work on diaphragmatic breathing, core engagement, manual work to pelvic floor to improve contraction, hip flexor stretches, bladder irritants   Marsha Skeen, PT 05/02/24 10:30 AM

## 2024-05-03 ENCOUNTER — Telehealth: Payer: Self-pay | Admitting: Obstetrics and Gynecology

## 2024-05-03 NOTE — Telephone Encounter (Signed)
 Called and spoke to patient:  Patient reports she went to the bathroom 11 times last night  She reports she is having good days and bad days. Last week she went 14 times to the bathroom. She reports she had to leave work due to the frequency and urgency. She is doing pelvic floor Pt with Marsha Skeen.   I asked patient if she would want to consider botox and she said she will consider this and talk to her husband and Dr. Aron Lard at her appointment.   Patient to call if there is anything I can do to support her in the meantime. She denies need for support at this time and declines moving up her appointment at this time.

## 2024-05-09 ENCOUNTER — Encounter: Payer: Managed Care, Other (non HMO) | Admitting: Physical Therapy

## 2024-05-09 ENCOUNTER — Encounter: Payer: Self-pay | Admitting: Physical Therapy

## 2024-05-09 DIAGNOSIS — R3915 Urgency of urination: Secondary | ICD-10-CM

## 2024-05-09 DIAGNOSIS — M6281 Muscle weakness (generalized): Secondary | ICD-10-CM

## 2024-05-09 DIAGNOSIS — R278 Other lack of coordination: Secondary | ICD-10-CM

## 2024-05-09 NOTE — Therapy (Signed)
 OUTPATIENT PHYSICAL THERAPY FEMALE PELVIC EVALUATION   Patient Name: Samantha Peck MRN: [id number] DOB:1966-08-21, 58 y.o., female Today's Date: 05/09/2024  END OF SESSION:  PT End of Session - 05/09/24 0911     Visit Number 3    Date for PT Re-Evaluation 10/26/24    Authorization Type [employer]    Authorization - Visit Number 3    Authorization - Number of Visits 30    PT Start Time 0912    PT Stop Time 1010    PT Time Calculation (min) 58 min    Activity Tolerance Patient tolerated treatment well    Behavior During Therapy [facility] for tasks assessed/performed             Past Medical History:  Diagnosis Date   Graves' disease in remission 2010   endocrinologist--- dr Aldona Amel   History of nonmelanoma skin cancer    skin   Hypertension    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 [place];  Service: Gynecology;  Laterality: N/A;   LAPAROSCOPIC LYSIS OF ADHESIONS N/A 11/20/2023   Procedure: LAPAROSCOPIC LYSIS OF ADHESIONS;  Surgeon: Darlene Ehlers, MD;  Location: [facility];  Service: Gynecology;  Laterality: N/A;   RECTOCELE REPAIR N/A 11/20/2023   Procedure: PERINEORRHAPHY;  Surgeon: Darlene Ehlers, MD;  Location: [facility];  Service: Gynecology;  Laterality: N/A;   XI ROBOTIC ASSISTED TOTAL HYSTERECTOMY WITH SACROCOLPOPEXY N/A 11/20/2023   Procedure: XI ROBOTIC ASSISTED TOTAL HYSTERECTOMY WITH BILATERAL SALPINGECTOMY AND SACROCOLPOPEXY;  Surgeon: Darlene Ehlers, MD;  Location: [facility];  Service: Gynecology;  Laterality: N/A;   Patient Active Problem List   Diagnosis Date Noted   History of pelvic surgery 01/25/2024   Constipation 01/01/2024   Urinary frequency 01/01/2024   Pelvic pain 01/01/2024   Pelvic organ prolapse quantification stage 3 rectocele 11/20/2023   Sprain of unspecified ligament of right ankle, initial encounter 09/19/2022   Contusion of left knee 09/19/2022   Weight gain 04/23/2018   Graves disease 07/13/2012    PCP: Candiss Chamorro, MD  REFERRING PROVIDER: Darlene Ehlers, MD   REFERRING DIAG:  R35.0 (ICD-10-CM) - Urinary frequency  [phone] (ICD-10-CM) - History of pelvic surgery  R10.2 (ICD-10-CM) - Pelvic pain    THERAPY DIAG:  Muscle weakness (generalized)  Other lack of coordination  Urinary urgency  Rationale for Evaluation and Treatment: Rehabilitation  ONSET DATE: 11/20/23  SUBJECTIVE:  SUBJECTIVE STATEMENT: I am still going frequently. I have been doing the exercises. I have not noticed the lower abdominal cramping. Urgency has doubled since surgery.   Fluid intake: soda, coffee, water   PAIN:  Are you having pain? Yes NPRS scale: 2/10 Pain location: low abdominal   Pain type: cramping Pain description: intermittent   Aggravating factors: when she has to have a bowel movement Relieving factors: feels better after a bowel movement  PRECAUTIONS: None  RED FLAGS: None   WEIGHT BEARING RESTRICTIONS: No  FALLS:  Has patient fallen in last 6 months? 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, computer all day  ACTIVITY LEVEL : no exercise since exercise; prior to surgery would walk on treadmill;   PLOF: Independent  PATIENT GOALS: reduce the amount she has to use the bathroom  PERTINENT HISTORY:  ROBOTIC  ASSISTED TOTAL HYSTERECTOMY WITH SACROCOLPOPEXY  11/20/23; BLADDER SUSPENSION 11/20/23; LAPAROSCOPIC LYSIS OF ADHESIONS 11/20/23; rectocele repair 11/20/23; Graves Disease; Hypertension Sexual abuse:  No BOWEL MOVEMENT: Pain with bowel movement: Yes Type of bowel movement:Type (Bristol Stool Scale) type 4 , Frequency daily, Strain no, and Splinting no Fully empty rectum: Yes:   Leakage: No Fiber supplement/laxative No  URINATION: Pain with urination: No Fully empty bladder: Yes: but after she urinates feels like she has to urinate again Stream: Strong Urgency: Yes  Frequency: first thing in the morning has to go to the bathroom frequently; wakes up 3-4 times per night; during the day goes to the bathroom 8 times in an hour Leakage: Urge to void and Walking to the bathroom Pads: No  INTERCOURSE:  Ability to have vaginal penetration Yes  Pain with intercourse: none DrynessNo  PREGNANCY: Vaginal deliveries 3 Tearing Yes: not sure about what kind of tear   PROLAPSE: None   OBJECTIVE:  Note: Objective measures were completed at Evaluation unless otherwise noted.  DIAGNOSTIC FINDINGS:  none  PATIENT SURVEYS:  PFIQ-7: 22 UIQ-7: 86  COGNITION: Overall cognitive status: Within functional limits for tasks assessed     SENSATION: Light touch: Appears intact     POSTURE: rounded shoulders, forward head, decreased lumbar lordosis, and posterior pelvic tilt   LUMBARAROM/PROM:  A/PROM A/PROM  eval  Extension Decreased by 50%   (Blank rows = not tested)  LOWER EXTREMITY ROM:  Passive ROM Right eval Left eval  Hip external rotation 50 50   (Blank rows = not tested)  LOWER EXTREMITY MMT:  MMT Right eval Left eval  Hip extension 4/5 4/5  Hip abduction 3/5 4/5  Hip adduction 4/5 5/5   (Blank rows = not tested) PALPATION:     Abdominal: decreased mobility of the diaphragm; unable to expand her diaphragm, abdomen is firm                External Perineal Exam:  intact                             Internal Pelvic Floor: tenderness located on bilateral iliococcygeus, tightness along the introitus and perineal body  Patient confirms identification and approves PT to assess internal pelvic floor and treatment Yes No emotional/communication barriers or cognitive limitation. Patient is motivated to learn. Patient understands and agrees with treatment goals and plan. PT explains patient will be examined in standing, sitting, and lying down to see how their muscles and joints work. When they are ready, they will be asked to remove their underwear so PT  can examine their perineum. The patient is also given the option of providing their own chaperone as one is not provided in our facility. The patient also has the right and is explained the right to defer or refuse any part of the evaluation or treatment including the internal exam. With the patient's consent, PT will use one gloved finger to gently assess the muscles of the pelvic floor, seeing how well it contracts and relaxes and if there is muscle symmetry. After, the patient will get dressed and PT and patient will discuss exam findings and plan of care. PT and patient discuss plan of care, schedule, attendance policy and HEP activities.   PELVIC MMT:   MMT eval 05/09/24  Vaginal 2/5 holding 5 sec ins supine; standing 2/5 3/5 in supine  (Blank rows = not tested)        TONE: average  PROLAPSE: none  TODAY'S TREATMENT:     05/09/24 Self Care:  Discussed with patient on bladder irritants. She drinks coffee and soda with no water . Educated patient on how bladder irritants affect the  bladder. Discussed with her on starting to drink more water  and less bladder irritants.  Manual: Myofascial release: Release of the perineal body, along the ischiocavernosus, bulbocavernosus, and urogenital diaphragm Internal pelvic floor techniques: No emotional/communication barriers or cognitive limitation. Patient is  motivated to learn. Patient understands and agrees with treatment goals and plan. PT explains patient will be examined in standing, sitting, and lying down to see how their muscles and joints work. When they are ready, they will be asked to remove their underwear so PT can examine their perineum. The patient is also given the option of providing their own chaperone as one is not provided in our facility. The patient also has the right and is explained the right to defer or refuse any part of the evaluation or treatment including the internal exam. With the patient's consent, PT will use one gloved finger to gently assess the muscles of the pelvic floor, seeing how well it contracts and relaxes and if there is muscle symmetry. After, the patient will get dressed and PT and patient will discuss exam findings and plan of care. PT and patient discuss plan of care, schedule, attendance policy and HEP activities.  Going through the vaginal canal with a gloved finger releasing the perineal body, levator ani, obturator internist, Going through the vaginal canal with a gloved finger performing fascial release around the urethra  Neuromuscular re-education: Core retraining: Transverse abdominus contraction 10 x with tactile cues to engage the muscle Supine marching with abdominal contraction 10 x each leg Down training: Sitting on ball to massage the pelvic floor    05/02/24 Manual: Soft tissue mobilization: Manual work to the abdomen, diaphragm, quadratus, lateral intercostals,  Myofascial release: Tissue rolling along the lateral and anterior rib cage Neuromuscular re-education: Form correction: Self diaphragm stretch in sitting to assist with diaphragmatic breathing Open book with elongating the rib cage and contracting the abdominals Educated patient on standing tall and walking with heel strike to elongate the anterior trunk due to her walking in a flexed  posture Exercises: Stretches/mobility: Sitting piriformis stretch holding 30 sec bil.  Sitting hamstring stretch holding 30 sec bil.  Sitting hip adductor stretch holding 30 sec bil.  Earline Glenn pose with diaphragmatic breathing Prone press up 10 x to elongate anterior trunk      PATIENT EDUCATION:  05/09/24 Education details: Access Code: [email or web address], educated patient on bladder irritants and suggested her to  reduce her coffee and soda Person educated: Patient Education method: Explanation, Demonstration, Tactile cues, Verbal cues, and Handouts Education comprehension: verbalized understanding, returned demonstration, verbal cues required, tactile cues required, and needs further education  HOME EXERCISE PROGRAM: 05/09/24 Access Code: [email or web address] URL: [email or web address] Date: 05/09/2024 Prepared by: Marsha Skeen  Program Notes sit on ball to massage the pelvic floor  Exercises - Seated Pelvic Floor Contraction  - 3 x daily - 7 x weekly - 1 sets - 10 reps - 5 sec hold - Seated Quick Flick Pelvic Floor Contractions  - 3 x daily - 7 x weekly - 1 sets - 5 reps - Sidelying Thoracic Rotation with Open Book  - 1 x daily - 7 x weekly - 1 sets - 10 reps - Prone Press Up  - 1 x daily - 7 x weekly - 1 sets - 10 reps - Diaphragmatic Breathing in Child's Pose with Pelvic Floor Relaxation  - 1 x daily - 7 x weekly - 1 sets - 2 reps - 30 sec hold - Seated Piriformis Stretch with Trunk Bend  - 1 x daily - 7 x weekly - 1 sets - 2 reps - 30 sec hold - Seated Hamstring Stretch  - 1 x daily - 7 x weekly - 1 sets - 2 reps - 30 sec hold - Seated Hip Adductor Stretch  - 1 x daily - 7 x weekly - 1 sets - 2 reps - 30 sec hold - Hooklying Transversus Abdominis Palpation  - 1 x daily - 7 x weekly - 1 sets - 10 reps - Supine March with Posterior Pelvic Tilt  - 1 x daily - 7 x weekly - 1 sets - 10 reps   ASSESSMENT:  CLINICAL IMPRESSION: Patient is a 58 y.o. female who was seen today for physical  therapy treatment for pelvic pain, urinary frequency. Patient has a history of ROBOTIC ASSISTED TOTAL HYSTERECTOMY WITH SACROCOLPOPEXY  , BLADDER SUSPENSION , LAPAROSCOPIC LYSIS OF ADHESIONS and rectocele repair 11/20/23.  Pelvic floor strength increased to 3/5. She had increased softness of the pelvic floor muscles. She is not able to engage her lower abdominals. She is able to feel her pelvic floor relax in sitting with diaphragmatic breathing. Patient continues to have urgency.   Patient is drinking coffee and soda during the day which is a bladder irritant and could cause urgency. Patient will benefit from skilled therapy to reduce urinary urgency, urinary leakage and strength.   OBJECTIVE IMPAIRMENTS: decreased activity tolerance, decreased coordination, decreased strength, increased fascial restrictions, and pain.   ACTIVITY LIMITATIONS: continence and locomotion level  PARTICIPATION LIMITATIONS: meal prep, cleaning, laundry, driving, shopping, community activity, and occupation  PERSONAL FACTORS: Age, Time since onset of injury/illness/exacerbation, and 1-2 comorbidities: ROBOTIC ASSISTED TOTAL HYSTERECTOMY WITH SACROCOLPOPEXY  11/20/23; BLADDER SUSPENSION 11/20/23; LAPAROSCOPIC LYSIS OF ADHESIONS 11/20/23; rectocele repair 11/20/23; Graves Disease; Hypertension are also affecting patient's functional outcome.   REHAB POTENTIAL: Excellent  CLINICAL DECISION MAKING: Evolving/moderate complexity  EVALUATION COMPLEXITY: Moderate   GOALS: Goals reviewed with patient? Yes  SHORT TERM GOALS: Target date: 05/23/24  Patient independent with initial HEP for pelvic floor strength.  Baseline: Goal status: Met 05/09/24  2.  Patient is able to perform diaphragmatic breathing to relax the pelvic floor when she has the urge to void.  Baseline:  Goal status: Met 05/09/24  3.  Patient educated on the urge to void behavioral technique to delay the urge to urinate 15 minutes.  Baseline:  Goal status:  INITIAL  4.  Patient understands what bladder irritants are and how they affect the urgency.  Baseline:  Goal status: Met 05/09/24  LONG TERM GOALS: Target date: 10/26/24  Patient independent with advanced HEP for pelvic floor and core strength to reduce urinary urgency and leakage.  Baseline:  Goal status: INITIAL  2.  Patient is able to have the urge to void and not leak urine on the floor as she is walking to the bathroom.  Baseline:  Goal status: INITIAL  3.  Patient is able to wait 2 hours prior to going to the bathroom due to urinary urgency decreased >/= 80% so she is able to work without disruption.  Baseline:  Goal status: INITIAL  4.  Patient is able to reduce her urinary leakage as she is walking to reduce the chance of her slipping on urine due to pelvic floor strength >/= 3/5.  Baseline:  Goal status: INITIAL  5.  Patient is able to wake up less than 1-2 times per night so she is able to get a full nights sleep.  Baseline:  Goal status: INITIAL   PLAN:  PT FREQUENCY: 1-2x/week  PT DURATION: 6 months  PLANNED INTERVENTIONS: 97110-Therapeutic exercises, 97530- Therapeutic activity, 97112- Neuromuscular re-education, 97535- Self Care, [id number]- Manual therapy, [phone]- Electrical stimulation (manual), Patient/Family education, Dry Needling, Joint mobilization, Spinal mobilization, Cryotherapy, Moist heat, and Biofeedback  PLAN FOR NEXT SESSION: mhip flexor stretches, bladder irritants, urge to void, HEP for core and pelvic floor   Marsha Skeen, PT 05/09/24 10:23 AM

## 2024-05-14 ENCOUNTER — Encounter: Payer: Self-pay | Admitting: Physical Therapy

## 2024-05-14 ENCOUNTER — Encounter: Payer: Managed Care, Other (non HMO) | Attending: Obstetrics | Admitting: Physical Therapy

## 2024-05-14 DIAGNOSIS — R3915 Urgency of urination: Secondary | ICD-10-CM | POA: Diagnosis present

## 2024-05-14 DIAGNOSIS — R278 Other lack of coordination: Secondary | ICD-10-CM | POA: Diagnosis present

## 2024-05-14 DIAGNOSIS — M6281 Muscle weakness (generalized): Secondary | ICD-10-CM | POA: Diagnosis present

## 2024-05-14 NOTE — Therapy (Signed)
 OUTPATIENT PHYSICAL THERAPY FEMALE PELVIC EVALUATION   Patient Name: Samantha Peck MRN: [id number] DOB:04-22-1966, 58 y.o., female Today's Date: 05/14/2024  END OF SESSION:  PT End of Session - 05/14/24 1036     Visit Number 4    Date for PT Re-Evaluation 10/26/24    Authorization Type [employer]    Authorization - Visit Number 4    Authorization - Number of Visits 30    PT Start Time 1030    PT Stop Time 1115    PT Time Calculation (min) 45 min    Activity Tolerance Patient tolerated treatment well    Behavior During Therapy [facility] for tasks assessed/performed             Past Medical History:  Diagnosis Date   Graves' disease in remission 2010   endocrinologist--- dr Aldona Amel   History of nonmelanoma skin cancer    skin   Hypertension    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 pelvic organs    SUI (stress urinary incontinence, female)    Past Surgical History:  Procedure Laterality Date   BLADDER SUSPENSION N/A 11/20/2023   Procedure: TRANSVAGINAL TAPE (TVT) PROCEDURE;  Surgeon: Darlene Ehlers, MD;  Location: [facility];  Service: Gynecology;  Laterality: N/A;   BREAST BIOPSY Right 01/02/2008   BUNIONECTOMY Bilateral 1996   CYSTOSCOPY N/A 11/20/2023   Procedure: CYSTOSCOPY;  Surgeon: Darlene Ehlers, MD;  Location: [facility];  Service: Gynecology;  Laterality: N/A;   LAPAROSCOPIC LYSIS OF ADHESIONS N/A 11/20/2023   Procedure: LAPAROSCOPIC LYSIS OF ADHESIONS;  Surgeon: Darlene Ehlers, MD;  Location: [facility] [place];  Service: Gynecology;  Laterality: N/A;   RECTOCELE REPAIR N/A 11/20/2023   Procedure: PERINEORRHAPHY;  Surgeon: Darlene Ehlers, MD;  Location: [facility];  Service: Gynecology;  Laterality: N/A;   XI ROBOTIC ASSISTED TOTAL HYSTERECTOMY WITH SACROCOLPOPEXY N/A 11/20/2023   Procedure: XI ROBOTIC ASSISTED TOTAL HYSTERECTOMY WITH BILATERAL SALPINGECTOMY AND SACROCOLPOPEXY;  Surgeon: Darlene Ehlers, MD;  Location: [facility];  Service: Gynecology;  Laterality: N/A;   Patient Active Problem List   Diagnosis Date Noted   History of pelvic surgery 01/25/2024   Constipation 01/01/2024   Urinary frequency 01/01/2024   Pelvic pain 01/01/2024   Pelvic organ prolapse quantification stage 3 rectocele 11/20/2023   Sprain of unspecified ligament of right ankle, initial encounter 09/19/2022   Contusion of left knee 09/19/2022   Weight gain 04/23/2018   Graves disease 07/13/2012    PCP: Candiss Chamorro, MD  REFERRING PROVIDER: Darlene Ehlers, MD   REFERRING DIAG:  R35.0 (ICD-10-CM) - Urinary frequency  [phone] (ICD-10-CM) - History of pelvic surgery  R10.2 (ICD-10-CM) - Pelvic pain    THERAPY DIAG:  Muscle weakness (generalized)  Other lack of coordination  Urinary urgency  Rationale for Evaluation and Treatment: Rehabilitation  ONSET DATE: 11/20/23  SUBJECTIVE:  SUBJECTIVE STATEMENT: I have been drinking more water . I am not having the smell from the urine. I got up 6 times last night. I am going 2-3 times per hour. Patient is able to hold her urine better.   Fluid intake: soda, coffee, water   PAIN:  Are you having pain? Yes NPRS scale: 2/10 Pain location: low abdominal   Pain type: cramping Pain description: intermittent   Aggravating factors: when she has to have a bowel movement Relieving factors: feels better after a bowel movement  PRECAUTIONS: None  RED FLAGS: None   WEIGHT BEARING RESTRICTIONS: No  FALLS:  Has patient fallen in last 6 months? 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, computer all day  ACTIVITY LEVEL : no exercise since exercise; prior to surgery would walk on treadmill;   PLOF: Independent  PATIENT GOALS: reduce the amount she has to use the  bathroom  PERTINENT HISTORY:  ROBOTIC ASSISTED TOTAL HYSTERECTOMY WITH SACROCOLPOPEXY  11/20/23; BLADDER SUSPENSION 11/20/23; LAPAROSCOPIC LYSIS OF ADHESIONS 11/20/23; rectocele repair 11/20/23; Graves Disease; Hypertension Sexual abuse:  No BOWEL MOVEMENT: Pain with bowel movement: Yes Type of bowel movement:Type (Bristol Stool Scale) type 4 , Frequency daily, Strain no, and Splinting no Fully empty rectum: Yes:   Leakage: No Fiber supplement/laxative No  URINATION: Pain with urination: No Fully empty bladder: Yes: but after she urinates feels like she has to urinate again Stream: Strong Urgency: Yes  Frequency: first thing in the morning has to go to the bathroom frequently; wakes up 3-4 times per night; during the day goes to the bathroom 8 times in an hour Leakage: Urge to void and Walking to the bathroom Pads: No  INTERCOURSE:  Ability to have vaginal penetration Yes  Pain with intercourse: none DrynessNo  PREGNANCY: Vaginal deliveries 3 Tearing Yes: not sure about what kind of tear   PROLAPSE: None   OBJECTIVE:  Note: Objective measures were completed at Evaluation unless otherwise noted.  DIAGNOSTIC FINDINGS:  none  PATIENT SURVEYS:  PFIQ-7: 28 UIQ-7: 86  COGNITION: Overall cognitive status: Within functional limits for tasks assessed     SENSATION: Light touch: Appears intact     POSTURE: rounded shoulders, forward head, decreased lumbar lordosis, and posterior pelvic tilt   LUMBARAROM/PROM:  A/PROM A/PROM  eval  Extension Decreased by 50%   (Blank rows = not tested)  LOWER EXTREMITY ROM:  Passive ROM Right eval Left eval  Hip external rotation 50 50   (Blank rows = not tested)  LOWER EXTREMITY MMT:  MMT Right eval Left eval Right 05/14/24 Left  05/14/24  Hip extension 4/5 4/5 5/5 5/5  Hip abduction 3/5 4/5 4/5 5/5  Hip adduction 4/5 5/5 5/5 5/5   (Blank rows = not tested) PALPATION:     Abdominal: decreased mobility of the  diaphragm; unable to expand her diaphragm, abdomen is firm                External Perineal Exam: intact                             Internal Pelvic Floor: tenderness located on bilateral iliococcygeus, tightness along the introitus and perineal body  Patient confirms identification and approves PT to assess internal pelvic floor and treatment Yes No emotional/communication barriers or cognitive limitation. Patient is motivated to learn. Patient understands and agrees with treatment goals and plan. PT explains patient will be examined in standing, sitting, and  lying down to see how their muscles and joints work. When they are ready, they will be asked to remove their underwear so PT can examine their perineum. The patient is also given the option of providing their own chaperone as one is not provided in our facility. The patient also has the right and is explained the right to defer or refuse any part of the evaluation or treatment including the internal exam. With the patient's consent, PT will use one gloved finger to gently assess the muscles of the pelvic floor, seeing how well it contracts and relaxes and if there is muscle symmetry. After, the patient will get dressed and PT and patient will discuss exam findings and plan of care. PT and patient discuss plan of care, schedule, attendance policy and HEP activities.   PELVIC MMT:   MMT eval 05/09/24  Vaginal 2/5 holding 5 sec ins supine; standing 2/5 3/5 in supine  (Blank rows = not tested)        TONE: average  PROLAPSE: none  TODAY'S TREATMENT:     05/14/24 Neuromuscular re-education: Core facilitation: Transverse abdominus contraction 10 x with tactile cues to engage the muscle Supine marching with abdominal contraction 10 x each leg with holding ball between hands and verbal cues to engage the core Exercises: Strengthening: Bridge with ball squeeze and pelvic floor contraction 15 x  Laying on side hip adduction while top hand  presses into ball 15 x each side Prone lift opposite arm and leg in quadruped but hurt her wrists so did in prone 15 x each side Standing bilateral shoulder extension with red band Standing bilateral shoulder horizontal abduction with red band     05/09/24 Self Care:  Discussed with patient on bladder irritants. She drinks coffee and soda with no water . Educated patient on how bladder irritants affect the  bladder. Discussed with her on starting to drink more water  and less bladder irritants.  Manual: Myofascial release: Release of the perineal body, along the ischiocavernosus, bulbocavernosus, and urogenital diaphragm Internal pelvic floor techniques: No emotional/communication barriers or cognitive limitation. Patient is motivated to learn. Patient understands and agrees with treatment goals and plan. PT explains patient will be examined in standing, sitting, and lying down to see how their muscles and joints work. When they are ready, they will be asked to remove their underwear so PT can examine their perineum. The patient is also given the option of providing their own chaperone as one is not provided in our facility. The patient also has the right and is explained the right to defer or refuse any part of the evaluation or treatment including the internal exam. With the patient's consent, PT will use one gloved finger to gently assess the muscles of the pelvic floor, seeing how well it contracts and relaxes and if there is muscle symmetry. After, the patient will get dressed and PT and patient will discuss exam findings and plan of care. PT and patient discuss plan of care, schedule, attendance policy and HEP activities.  Going through the vaginal canal with a gloved finger releasing the perineal body, levator ani, obturator internist, Going through the vaginal canal with a gloved finger performing fascial release around the urethra  Neuromuscular re-education: Core retraining: Transverse  abdominus contraction 10 x with tactile cues to engage the muscle Supine marching with abdominal contraction 10 x each leg Down training: Sitting on ball to massage the pelvic floor    05/02/24 Manual: Soft tissue mobilization: Manual work to the  abdomen, diaphragm, quadratus, lateral intercostals,  Myofascial release: Tissue rolling along the lateral and anterior rib cage Neuromuscular re-education: Form correction: Self diaphragm stretch in sitting to assist with diaphragmatic breathing Open book with elongating the rib cage and contracting the abdominals Educated patient on standing tall and walking with heel strike to elongate the anterior trunk due to her walking in a flexed posture Exercises: Stretches/mobility: Sitting piriformis stretch holding 30 sec bil.  Sitting hamstring stretch holding 30 sec bil.  Sitting hip adductor stretch holding 30 sec bil.  Earline Glenn pose with diaphragmatic breathing Prone press up 10 x to elongate anterior trunk      PATIENT EDUCATION:  05/14/24 Education details: Access Code: [email or web address], educated patient on bladder irritants and suggested her to reduce her coffee and soda Person educated: Patient Education method: Explanation, Demonstration, Tactile cues, Verbal cues, and Handouts Education comprehension: verbalized understanding, returned demonstration, verbal cues required, tactile cues required, and needs further education  HOME EXERCISE PROGRAM: 05/14/24 Access Code: [email or web address] URL: [email or web address] Date: 05/14/2024 Prepared by: Marsha Skeen  Program Notes sit on ball to massage the pelvic floor  Exercises - Seated Pelvic Floor Contraction  - 3 x daily - 7 x weekly - 1 sets - 10 reps - 5 sec hold - Seated Quick Flick Pelvic Floor Contractions  - 3 x daily - 7 x weekly - 1 sets - 5 reps - Sidelying Thoracic Rotation with Open Book  - 1 x daily - 7 x weekly - 1 sets - 10 reps - Prone Press Up  - 1 x daily - 7 x weekly - 1  sets - 10 reps - Diaphragmatic Breathing in Child's Pose with Pelvic Floor Relaxation  - 1 x daily - 7 x weekly - 1 sets - 2 reps - 30 sec hold - Seated Piriformis Stretch with Trunk Bend  - 1 x daily - 7 x weekly - 1 sets - 2 reps - 30 sec hold - Seated Hamstring Stretch  - 1 x daily - 7 x weekly - 1 sets - 2 reps - 30 sec hold - Seated Hip Adductor Stretch  - 1 x daily - 7 x weekly - 1 sets - 2 reps - 30 sec hold - Hooklying Transversus Abdominis Palpation  - 1 x daily - 2 x weekly - 1 sets - 10 reps - Supine March with Posterior Pelvic Tilt  - 1 x daily - 2 x weekly - 1 sets - 10 reps - Supine Bridge with Mini Swiss Ball Between Knees  - 1 x daily - 2 x weekly - 1 sets - 15 reps - Sidelying Hip Adduction  - 1 x daily - 2 x weekly - 1 sets - 15 reps - Prone Alternating Arm and Leg Lifts  - 1 x daily - 2 x weekly - 1 sets - 10 reps - Shoulder extension with resistance - Neutral  - 1 x daily - 2 x weekly - 2 sets - 10 reps - Standing Shoulder Horizontal Abduction with Anchored Resistance  - 1 x daily - 2 x weekly - 2 sets - 10 reps   ASSESSMENT:  CLINICAL IMPRESSION: Patient is a 58 y.o. female who was seen today for physical therapy treatment for pelvic pain, urinary frequency. Patient has a history of ROBOTIC ASSISTED TOTAL HYSTERECTOMY WITH SACROCOLPOPEXY  , BLADDER SUSPENSION , LAPAROSCOPIC LYSIS OF ADHESIONS and rectocele repair 11/20/23.  Patient has increased in bilateral hip strength.  She is now drinking more water  and her  urine does not smell like cleaner fluid. She fatigues easily with her exercises. She will be working on waiting to urinate every hour then build on that. She is going to stop drinking fluids at 7:00 at night to see if that helps her from getting up frequently at night. Patient will benefit from skilled therapy to reduce urinary urgency, urinary leakage and strength.   OBJECTIVE IMPAIRMENTS: decreased activity tolerance, decreased coordination, decreased strength,  increased fascial restrictions, and pain.   ACTIVITY LIMITATIONS: continence and locomotion level  PARTICIPATION LIMITATIONS: meal prep, cleaning, laundry, driving, shopping, community activity, and occupation  PERSONAL FACTORS: Age, Time since onset of injury/illness/exacerbation, and 1-2 comorbidities: ROBOTIC ASSISTED TOTAL HYSTERECTOMY WITH SACROCOLPOPEXY  11/20/23; BLADDER SUSPENSION 11/20/23; LAPAROSCOPIC LYSIS OF ADHESIONS 11/20/23; rectocele repair 11/20/23; Graves Disease; Hypertension are also affecting patient's functional outcome.   REHAB POTENTIAL: Excellent  CLINICAL DECISION MAKING: Evolving/moderate complexity  EVALUATION COMPLEXITY: Moderate   GOALS: Goals reviewed with patient? Yes  SHORT TERM GOALS: Target date: 05/23/24  Patient independent with initial HEP for pelvic floor strength.  Baseline: Goal status: Met 05/09/24  2.  Patient is able to perform diaphragmatic breathing to relax the pelvic floor when she has the urge to void.  Baseline:  Goal status: Met 05/09/24  3.  Patient educated on the urge to void behavioral technique to delay the urge to urinate 15 minutes.  Baseline:  Goal status: Met 05/14/24  4.  Patient understands what bladder irritants are and how they affect the urgency.  Baseline:  Goal status: Met 05/09/24  LONG TERM GOALS: Target date: 10/26/24  Patient independent with advanced HEP for pelvic floor and core strength to reduce urinary urgency and leakage.  Baseline:  Goal status: INITIAL  2.  Patient is able to have the urge to void and not leak urine on the floor as she is walking to the bathroom.  Baseline:  Goal status: INITIAL  3.  Patient is able to wait 2 hours prior to going to the bathroom due to urinary urgency decreased >/= 80% so she is able to work without disruption.  Baseline:  Goal status: INITIAL  4.  Patient is able to reduce her urinary leakage as she is walking to reduce the chance of her slipping on urine due to  pelvic floor strength >/= 3/5.  Baseline:  Goal status: INITIAL  5.  Patient is able to wake up less than 1-2 times per night so she is able to get a full nights sleep.  Baseline:  Goal status: INITIAL   PLAN:  PT FREQUENCY: 1-2x/week  PT DURATION: 6 months  PLANNED INTERVENTIONS: 97110-Therapeutic exercises, 97530- Therapeutic activity, 97112- Neuromuscular re-education, 97535- Self Care, [id number]- Manual therapy, [phone]- Electrical stimulation (manual), Patient/Family education, Dry Needling, Joint mobilization, Spinal mobilization, Cryotherapy, Moist heat, and Biofeedback  PLAN FOR NEXT SESSION:  HEP for core and pelvic floor, if doing well then discharge, she is going to take 1-2 months to work on her HEP due to finances   Marsha Skeen, PT 05/14/24 11:28 AM

## 2024-05-16 ENCOUNTER — Ambulatory Visit: Payer: Commercial Managed Care - PPO | Admitting: Internal Medicine

## 2024-05-16 ENCOUNTER — Encounter: Payer: Self-pay | Admitting: Internal Medicine

## 2024-05-16 VITALS — BP 118/64 | HR 89 | Ht 66.0 in | Wt 190.4 lb

## 2024-05-16 DIAGNOSIS — E0500 Thyrotoxicosis with diffuse goiter without thyrotoxic crisis or storm: Secondary | ICD-10-CM | POA: Diagnosis not present

## 2024-05-16 LAB — T3, FREE: T3, Free: 3.5 pg/mL (ref 2.3–4.2)

## 2024-05-16 LAB — T4, FREE: Free T4: 1.2 ng/dL (ref 0.8–1.8)

## 2024-05-16 LAB — TSH: TSH: 1.41 m[IU]/L (ref 0.40–4.50)

## 2024-05-16 NOTE — Progress Notes (Unsigned)
 Patient ID: Samantha Peck, female   DOB: [birth year], 58 y.o.   MRN: [id number]   HPI  Samantha Peck is a 58 y.o.-year-old female, presenting for follow-up for thyrotoxicosis, likely secondary to Graves' disease.  Last visit 6 mo ago.  Interim history: She initially lost approximately 30 pounds on weight watchers, but then she gained 30 pounds back due to relaxing her diet-including chips and candy bars.  At last visit, she was planning to go back to the plant-based diet, which worked well for her in the past.  At today's visit, she gained 5 pounds since last visit. She denies tremors, palpitations, anxiety. She had hot flushes - chronic, intermittent. Now more cold intolerance. Since last visit, she had a UTI with Samantha Peck after TAH 11/20/2023 due to pelvic prolapse with frequent urination - did not help much. Now doing pelvic floor exercises, which help.  Reviewed history: She was dx with Graves ds. In 2010 (lost 15 lbs then)  >> started MMI >> but not compliant 2/2 lack of insurance >>  off the med completely since 11/2015.  We restarted MMI when she restarted to have insurance.  We changed to methimazole  10 mg in am and 5 mg in pm in 01/2017 >> TFTs improved afterwards.  In 10/2017, we increase the dose of methimazole  to 7.5 mg in a.m. and 5 mg in p.m.  However, next TSH was elevated, so we decreased the dose to 5 mg twice a day in 12/2017, and then 5 mg once a day and 02/2018.    In 04/2018, we increased her methimazole  dose to 7.5 mg daily.  TFTs normalized on this dose.  We had to increase the dose to 5 mg twice a day in 10/2018 due to a suppressed TSH.  Afterwards, we decreased the dose of methimazole  in 03/2019 to 2.5 mg daily.  However, we had to increase the dose to 5 mg daily in 07/2019.  Subsequent TFTs were normal.  In 01/2020, we decreased the dose further to 2.5 mg daily.  In 10/2021, we increased the dose to 5 mg daily.  In 11/2021, we increased the dose  to 5 mg twice a day.  In 01/2023, we decreased the dose to 7.5 mg daily.  Reviewed her TFTs:  Lab Results  Component Value Date   TSH 2.30 11/03/2023   TSH 1.16 05/03/2023   TSH 2.16 03/23/2023   TSH 4.49 02/09/2023   TSH 3.33 12/09/2022   TSH 3.29 11/08/2022   TSH 0.01 (L) 05/04/2022   TSH <0.01 (L) 03/07/2022   TSH <0.01 (L) 01/06/2022   TSH <0.01 (L) 12/07/2021   FREET4 0.72 11/03/2023   FREET4 0.88 05/03/2023   FREET4 0.69 03/23/2023   FREET4 0.47 (L) 02/09/2023   FREET4 0.53 (L) 12/09/2022   FREET4 0.74 11/08/2022   FREET4 0.80 05/04/2022   FREET4 1.05 03/07/2022   FREET4 1.7 01/06/2022   FREET4 2.54 (H) 12/07/2021  02/08/2021: TSH 1.43, White blood cell count 7.3, normal  Lab Results  Component Value Date   T3FREE 4.0 11/03/2023   T3FREE 3.8 05/03/2023   T3FREE 4.1 03/23/2023   T3FREE 3.8 02/09/2023   T3FREE 4.4 (H) 12/09/2022   T3FREE 5.2 (H) 11/08/2022   T3FREE 4.0 05/04/2022   T3FREE 4.7 (H) 03/07/2022   T3FREE 6.7 (H) 01/06/2022   T3FREE 9.8 (H) 12/07/2021   Her TSI's continue to decrease: Lab Results  Component Value Date   TSI <89 04/29/2021   TSI 132 04/29/2020  TSI 172 (H) 10/25/2018   TSI 424 (H) 10/23/2017   TSI 258 (H) 08/24/2016   Pt denies: - feeling nodules in neck - hoarseness - dysphagia - choking  Pt does not have FH of thyroid  ds. No FH of thyroid  cancer. No h/o radiation tx to head or neck. No herbal supplements. No Biotin use. No recent steroids use.   She has longstanding double vision-intermittently, when tired. Dr. Phyllistine Peck.   ROS: + hot flushes  I reviewed pt's medications, allergies, PMH, social hx, family hx, and changes were documented in the history of present illness. Otherwise, unchanged from my initial visit note.  Past Medical History:  Diagnosis Date   Graves' disease in remission 2010   endocrinologist--- dr Samantha Peck   History of nonmelanoma skin cancer    skin   Hypertension    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  pelvic organs    SUI (stress urinary incontinence, female)    Past Surgical History:  Procedure Laterality Date   BLADDER SUSPENSION N/A 11/20/2023   Procedure: TRANSVAGINAL TAPE (TVT) PROCEDURE;  Surgeon: Samantha Ehlers, MD;  Location: [facility] [place];  Service: Gynecology;  Laterality: N/A;   BREAST BIOPSY Right 01/02/2008   BUNIONECTOMY Bilateral 1996   CYSTOSCOPY N/A 11/20/2023   Procedure: CYSTOSCOPY;  Surgeon: Samantha Ehlers, MD;  Location: [facility] [place];  Service: Gynecology;  Laterality: N/A;   LAPAROSCOPIC LYSIS OF ADHESIONS N/A 11/20/2023   Procedure: LAPAROSCOPIC LYSIS OF ADHESIONS;  Surgeon: Samantha Ehlers, MD;  Location: [facility] [place];  Service: Gynecology;  Laterality: N/A;   RECTOCELE REPAIR N/A 11/20/2023   Procedure: PERINEORRHAPHY;  Surgeon: Samantha Ehlers, MD;  Location: [facility];  Service: Gynecology;  Laterality: N/A;   XI ROBOTIC ASSISTED TOTAL HYSTERECTOMY WITH SACROCOLPOPEXY N/A 11/20/2023   Procedure: XI ROBOTIC ASSISTED TOTAL HYSTERECTOMY WITH BILATERAL SALPINGECTOMY AND SACROCOLPOPEXY;  Surgeon: Samantha Ehlers, MD;  Location: [facility] [place];  Service: Gynecology;  Laterality: N/A;   Social History   Socioeconomic History   Marital status: Married    Spouse name: Not on file   Number of children: 3   Years of education: 14   Highest education level: Not on file  Occupational History   Occupation: Event organiser: [employer]  Tobacco Use   Smoking status: Former    Types: Cigarettes    Start date: 1988   Smokeless tobacco: Never   Tobacco comments:    11-13-2023  per pt quit smoking 2009,  stated smoking age 7  Vaping Use   Vaping status: Never Used  Substance and Sexual Activity   Alcohol use: Not Currently   Drug use: Never   Sexual activity: Not Currently    Birth control/protection: Post-menopausal  Other Topics Concern   Not on file  Social History Narrative   Regular exercise-no    Caffeine Use-yes   Social Drivers of Health   Financial Resource Strain: Not on file  Food Insecurity: Not on file  Transportation Needs: Not on file  Physical Activity: Not on file  Stress: Not on file  Social Connections: Not on file  Intimate Partner Violence: Not on file   No Known Allergies Family History  Problem Relation Age of Onset   Diabetes Mother    Heart disease Mother    Hypertension Mother    Breast cancer Mother 32   Hypertension Father    Breast cancer Maternal Grandmother    Colon cancer Neg Hx  Colon polyps Neg Hx    Esophageal cancer Neg Hx    Rectal cancer Neg Hx    Stomach cancer Neg Hx    Current Outpatient Medications  Medication Sig Dispense Refill   amitriptyline  (ELAVIL ) 25 MG tablet Take 1 tablet (25 mg total) by mouth at bedtime. Start week 2 of treatment 30 tablet 0   chlorthalidone  (HYGROTON ) 25 MG tablet Take 0.5 tablets (12.5 mg total) by mouth daily for fluid/hypertension 45 tablet 0   cimetidine  (TAGAMET  HB) 200 MG tablet Take 1 tablet (200 mg total) by mouth at bedtime. Increase to 2 times a day if no daytime sedation 60 tablet 0   losartan  (COZAAR ) 50 MG tablet Take 1 tablet (50 mg total) by mouth daily. 90 tablet 0   methimazole  (TAPAZOLE ) 5 MG tablet Take 1.5 tablets (7.5 mg total) by mouth daily with a meal 30 tablet 0   mirabegron  ER (MYRBETRIQ ) 25 MG TB24 tablet Take 1 tablet (25 mg total) by mouth daily. 30 tablet 0   mirabegron  ER (MYRBETRIQ ) 50 MG TB24 tablet Take 1 tablet (50 mg total) by mouth daily. 30 tablet 2   nitrofurantoin , macrocrystal-monohydrate, (MACROBID ) 100 MG capsule Take 1 capsule (100 mg total) by mouth 2 (two) times daily. 10 capsule 0   oxyCODONE  (OXY IR/ROXICODONE ) 5 MG immediate release tablet Take 1 tablet (5 mg total) by mouth every 4 (four) hours as needed for severe pain (pain score 7-10). 15 tablet 0   phenazopyridine  (PYRIDIUM ) 200 MG tablet Take 1 tablet (200 mg total) by mouth 3 (three) times daily as  needed for pain. 10 tablet 0   phenazopyridine  (PYRIDIUM ) 95 MG tablet Take 1 tablet (95 mg total) by mouth 3 (three) times daily as needed for pain. 12 tablet 1   polyethylene glycol powder (GLYCOLAX /MIRALAX ) 17 GM/SCOOP powder Take 17 g by mouth daily. Drink 17g (1 scoop) dissolved in water  per day. 238 g 0   No current facility-administered medications for this visit.   PE: BP 118/64   Pulse 89   Ht 5\' 6"  (1.676 m)   Wt 190 lb 6.4 oz (86.4 kg)   LMP 09/12/2019 (Approximate)   SpO2 98%   BMI 30.73 kg/m  Wt Readings from Last 3 Encounters:  05/16/24 190 lb 6.4 oz (86.4 kg)  11/20/23 180 lb 1.6 oz (81.7 kg)  08/18/23 179 lb (81.2 kg)   Constitutional: overweight, in NAD Eyes:  no exophthalmos ENT: no neck masses, no cervical lymphadenopathy Cardiovascular: RRR, No MRG Respiratory: CTA B Musculoskeletal: no deformities Skin:no rashes Neurological: no tremor with outstretched hands  ASSESSMENT: 1.  Graves' disease  PLAN:  1. Patient with recurrent Graves' disease, on methimazole .  We initially started her on 10 mg in a.m. and 5 mg in p.m. but we were able to gradually decrease the dose to 2.5 mg daily.  However, afterwards, the dose fluctuated with the latest change being decreased from 5 mg twice a day to 7.5 mg daily in 01/2023.  On this dose, her TFTs were normal a year ago.  She had another check in 10/2023 and 03/2024 and the TFTs were normal.   -Reviewing her TSI's, they were initially elevated but then normalized in 04/2020 and became undetectable in 04/2021 - We previously discussed about definitive treatment of Graves' disease to include RAI ablation versus thyroidectomy.  She would be more prone to thyroidectomy if absolutely needed.  We discussed about details of the surgery, including location, hospitalization time, and absolute need for  levothyroxine afterwards.  We discussed that hypothyroidism is a more manageable and more benign condition compared to uncontrolled  hyperthyroidism. - At today's visit, we will recheck her TFTs and change the methimazole  dose accordingly.  We decided we will check her TFTs again even though she will had them checked 2 months ago, due to increased cold intolerance and weight gain. - For now, I advised her to continue methimazole  7.5 mg daily until the results return - I will see her back in a year, we will have her back in 6 months for labs  Needs refills.  Office Visit on 05/16/2024  Component Date Value Ref Range Status   TSH 05/16/2024 1.41  0.40 - 4.50 mIU/L Final   Free T4 05/16/2024 1.2  0.8 - 1.8 ng/dL Final   T3, Free 16/09/9603 3.5  2.3 - 4.2 pg/mL Final  Normal TFTs >> we will continue the same dose of methimazole .  Methimazole  was refilled at the same dose.  Emilie Harden, MD PhD [facility] Endocrinology

## 2024-05-16 NOTE — Patient Instructions (Signed)
 Please continue methimazole  7.5 mg daily.  Please stop at the lab.  Please come back for a follow-up appointment in 1 year but with labs in 6 months.

## 2024-05-17 ENCOUNTER — Other Ambulatory Visit (HOSPITAL_COMMUNITY): Payer: Self-pay

## 2024-05-17 ENCOUNTER — Ambulatory Visit: Payer: Self-pay | Admitting: Internal Medicine

## 2024-05-17 DIAGNOSIS — E0500 Thyrotoxicosis with diffuse goiter without thyrotoxic crisis or storm: Secondary | ICD-10-CM

## 2024-05-17 MED ORDER — METHIMAZOLE 5 MG PO TABS
7.5000 mg | ORAL_TABLET | Freq: Every day | ORAL | 3 refills | Status: AC
  Filled 2024-05-17: qty 135, 90d supply, fill #0
  Filled 2024-08-13: qty 135, 90d supply, fill #1
  Filled 2024-11-16: qty 135, 90d supply, fill #2

## 2024-05-18 ENCOUNTER — Other Ambulatory Visit (HOSPITAL_BASED_OUTPATIENT_CLINIC_OR_DEPARTMENT_OTHER): Payer: Self-pay

## 2024-05-20 ENCOUNTER — Other Ambulatory Visit (HOSPITAL_COMMUNITY): Payer: Self-pay

## 2024-05-31 ENCOUNTER — Other Ambulatory Visit: Payer: Self-pay

## 2024-05-31 ENCOUNTER — Encounter: Payer: Self-pay | Admitting: Obstetrics

## 2024-05-31 ENCOUNTER — Ambulatory Visit: Admitting: Obstetrics

## 2024-05-31 ENCOUNTER — Other Ambulatory Visit (HOSPITAL_COMMUNITY): Payer: Self-pay

## 2024-05-31 VITALS — BP 121/76 | HR 73

## 2024-05-31 DIAGNOSIS — R350 Frequency of micturition: Secondary | ICD-10-CM | POA: Diagnosis not present

## 2024-05-31 MED ORDER — CIPROFLOXACIN HCL 500 MG PO TABS
500.0000 mg | ORAL_TABLET | Freq: Two times a day (BID) | ORAL | 0 refills | Status: AC
  Filled 2024-05-31: qty 2, 1d supply, fill #0

## 2024-05-31 NOTE — Assessment & Plan Note (Signed)
 - returned to baseline urinary frequency and urgency prior to surgery with exacerbation postop - preop UDS with DOI and dyssynergic EMG - relief with pelvic floor PT - denies relief with mirabegron  and gemtesa  - We discussed the symptoms of overactive bladder (OAB), which include urinary urgency, urinary frequency, nocturia, with or without urge incontinence.  While we do not know the exact etiology of OAB, several treatment options exist. We discussed management including behavioral therapy (decreasing bladder irritants, urge suppression strategies, timed voids, bladder retraining), physical therapy, medication; for refractory cases posterior tibial nerve stimulation, sacral neuromodulation, and intravesical botulinum toxin injection.  For anticholinergic medications, we discussed the potential side effects of anticholinergics including dry eyes, dry mouth, constipation, cognitive impairment and urinary retention. Avoided due to history of constipation For Beta-3 agonist medication, we discussed the potential side effect of elevated blood pressure which is more likely to occur in individuals with uncontrolled hypertension. - encouraged bladder training, continue reduction of pepsi intake and increase water  intake - negative cystoscopy 01/25/24 For refractory OAB we reviewed the procedure for intravesical Botox injection with cystoscopy in the office and reviewed the risks, benefits and alternatives of treatment including but not limited to infection, need for self-catheterization and need for repeat therapy.  We discussed that there is a 5-15% chance of needing to catheterize with Botox and that this usually resolves in a few months; however can persist for longer periods of time.  Typically Botox injections would need to be repeated every 3-12 months since this is not a permanent therapy.   We discussed the role of sacral neuromodulation and how it works. It requires a test phase, and documentation of  bladder function via diary. After a successful test period, a permanent wire and generator are placed in the OR. The battery lasts 5 years on average and would need to be replaced surgically.  The goal of this therapy is at least a 50% improvement in symptoms. It is NOT realistic to expect a 100% cure.  We reviewed the fact that about 30% of patients fail the test phase and are not candidates for permanent generator placement.  We discussed the risk of infection and that the patient would not be able to get an MRI once the device is placed. There are two companies that provide this therapy: [employer]tr[employer] and [employer]. [employer]' product is new and is similar to Medtr[employer]'s, but has advantages of a smaller and rechargeable battery and being able to have an MRI with the implant. For all procedures, we discussed risks of bleeding, infection, damage to surrounding organs including bowel, bladder, blood vessels, ureters and nerves, need for further surgery, risk of postoperative urinary incontinence or retention with need to catheterize, recurrent prolapse, numbness and weakness at any body site, buttock pain, and the rarer risks of blood clot, heart attack, pneumonia, death.    We also discussed the role of percutaneous tibial nerve stimulation and how it works.  She understands it requires 12 weekly visits for temporary neuromodulation of the sacral nerve roots via the tibial nerve and that she may then require continued tapered treatment.   - pt desires to proceed with botox injections. Declined PTNS and SNS. Underwent CIC teaching without difficulty. Provided catheters and only start if she experiences worsening urinary symptoms after botox. Rx cipro  for pre-procedure prolphylaxis  - reviewed overlapping symptomatology with interstitial cystitis, previously improved with night time hydroxyzine  but cannot tolerate exacerbation of restless legs (similar effect from benadryl) - switched to cimetidine  and titrate  up to  2x/day if tolerated - For irritative bladder we reviewed treatment options including altering her diet to avoid irritative beverages and foods as well as attempting to decrease stress and other exacerbating factors.  We also discussed using pyridium  and similar over-the-counter medications for pain relief as needed. We discussed the pentad of medications including Tums, an antihistamine such as Vistaril , amitriptyline , and L-arginine.  We also discussed in-office bladder instillations for pain flares, as well as cystoscopy with hydrodistention in the operating room, which can be both diagnostic and therapeutic. She was also given information on the IC Network at https://www.ic-network.com for bladder diet suggestions and patient forums for support. - underwent bladder instillation x 1 in 01/01/24 - encouraged stress management due to recent loss of cat and husband's heart failure diagnosis, however reports repeat Echo with improved EF.

## 2024-05-31 NOTE — Progress Notes (Signed)
 [facility]  Date of Visit: 05/31/2024  History of Present Illness: [name] is a 58 y.o. female scheduled today for 6 months follow-up visit.   Surgery: s/p Robotic assisted laparoscopic lysis of adhesion, hysterectomy, sacrocolpopexy, bilateral salpingectomy, posterior repair, perineorrhaphy, midurethral sling on 11/20/23 for stage III pelvic organ prolapse, stress urinary incontinence, constipation, and pelvic adhesions.  Underwent PFPT x 4 and started bladder training with timed voids. Reports some reduction of UUI and urinary frequency every 2 hours Denies prior relief from Mirabegron  or Gemtesa  Reduced caffeine intake without relief.  Stopped amitriptyline  due to concerns with leakage at night if she is too sleepy.  Reports voiding voids 10-11x/day down from 30x/day Baseline: Day time voids 11-20x/day Nocturia: 6x with baseline 3-7x/night to void.  Drinks 60-80oz water /day, 1 can of diet caffinate soda and 8oz coffee Cystoscopy negative for injury or foreign material on 01/25/24  Previously reported relief of night time urinary frequency from hydroxyzine , however it worsens restless legs, overstimulation and mood swings. History of Grave's disease  Subjective Success: Do you usually have a bulge or something falling out that you can see or feel in the vaginal area? No  Retreatment Success: Any retreatment with surgery or pessary for any compartment? No   Medications: She has a current medication list which includes the following prescription(s): ciprofloxacin , chlorthalidone , losartan , and methimazole .   Allergies: Patient has no known allergies.   Physical Exam: Today's Vitals   05/31/24 1510  BP: 121/76  Pulse: 73   There is no height or weight on file to calculate BMI.  CIC teaching: Reviewed anatomy, handwashing, supplies needed for CIC. After verbal consent was obtained from the patient for catheterization prior to pending bladder botox injection. Patient  performed an in and out catheterization with minimal assistance x 2, CIC volume 50mL noted.  The patient tolerated the procedure well.  Assessment and Plan:  1. Urinary frequency     Urinary frequency Assessment & Plan: - returned to baseline urinary frequency and urgency prior to surgery with exacerbation postop - preop UDS with DOI and dyssynergic EMG - relief with pelvic floor PT - denies relief with mirabegron  and gemtesa  - We discussed the symptoms of overactive bladder (OAB), which include urinary urgency, urinary frequency, nocturia, with or without urge incontinence.  While we do not know the exact etiology of OAB, several treatment options exist. We discussed management including behavioral therapy (decreasing bladder irritants, urge suppression strategies, timed voids, bladder retraining), physical therapy, medication; for refractory cases posterior tibial nerve stimulation, sacral neuromodulation, and intravesical botulinum toxin injection.  For anticholinergic medications, we discussed the potential side effects of anticholinergics including dry eyes, dry mouth, constipation, cognitive impairment and urinary retention. Avoided due to history of constipation For Beta-3 agonist medication, we discussed the potential side effect of elevated blood pressure which is more likely to occur in individuals with uncontrolled hypertension. - encouraged bladder training, continue reduction of pepsi intake and increase water  intake - negative cystoscopy 01/25/24 For refractory OAB we reviewed the procedure for intravesical Botox injection with cystoscopy in the office and reviewed the risks, benefits and alternatives of treatment including but not limited to infection, need for self-catheterization and need for repeat therapy.  We discussed that there is a 5-15% chance of needing to catheterize with Botox and that this usually resolves in a few months; however can persist for longer periods of time.   Typically Botox injections would need to be repeated every 3-12 months since this is not  a permanent therapy.   We discussed the role of sacral neuromodulation and how it works. It requires a test phase, and documentation of bladder function via diary. After a successful test period, a permanent wire and generator are placed in the OR. The battery lasts 5 years on average and would need to be replaced surgically.  The goal of this therapy is at least a 50% improvement in symptoms. It is NOT realistic to expect a 100% cure.  We reviewed the fact that about 30% of patients fail the test phase and are not candidates for permanent generator placement.  We discussed the risk of infection and that the patient would not be able to get an MRI once the device is placed. There are two companies that provide this therapy: [employer] and [employer]. [employer]' product is new and is similar to [employer]'s, but has advantages of a smaller and rechargeable battery and being able to have an MRI with the implant. For all procedures, we discussed risks of bleeding, infection, damage to surrounding organs including bowel, bladder, blood vessels, ureters and nerves, need for further surgery, risk of postoperative urinary incontinence or retention with need to catheterize, recurrent prolapse, numbness and weakness at any body site, buttock pain, and the rarer risks of blood clot, heart attack, pneumonia, death.    We also discussed the role of percutaneous tibial nerve stimulation and how it works.  She understands it requires 12 weekly visits for temporary neuromodulation of the sacral nerve roots via the tibial nerve and that she may then require continued tapered treatment.   - pt desires to proceed with botox injections. Declined PTNS and SNS. Underwent CIC teaching without difficulty. Provided catheters and only start if she experiences worsening urinary symptoms after botox. Rx cipro  for pre-procedure prolphylaxis  - reviewed  overlapping symptomatology with interstitial cystitis, previously improved with night time hydroxyzine  but cannot tolerate exacerbation of restless legs (similar effect from benadryl) - switched to cimetidine  and titrate up to 2x/day if tolerated - For irritative bladder we reviewed treatment options including altering her diet to avoid irritative beverages and foods as well as attempting to decrease stress and other exacerbating factors.  We also discussed using pyridium  and similar over-the-counter medications for pain relief as needed. We discussed the pentad of medications including Tums, an antihistamine such as Vistaril , amitriptyline , and L-arginine.  We also discussed in-office bladder instillations for pain flares, as well as cystoscopy with hydrodistention in the operating room, which can be both diagnostic and therapeutic. She was also given information on the IC Network at https://www.ic-network.com for bladder diet suggestions and patient forums for support. - underwent bladder instillation x 1 in 01/01/24 - encouraged stress management due to recent loss of cat and husband's heart failure diagnosis, however reports repeat Echo with improved EF.    Other orders -     Ciprofloxacin  HCl; Take 1 tablet (500 mg total) by mouth 2 (two) times daily.  Dispense: 2 tablet; Refill: 0   Return for bladder botox injection.  Time spent: I spent 35 minutes dedicated to the care of this patient on the date of this encounter to include pre-visit review of records, face-to-face time with the patient discussing urinary frequency and post visit documentation and ordering medication/ testing.

## 2024-05-31 NOTE — Progress Notes (Deleted)
 PFPT and timed voids with some reduction of UUI.  Denies relief from Mirabegron  ro Gemtesa  Reduced caffeine intake without relief.  Stopped amitriptyline  due to concerns with leakage at night if she is too sleepy.  Reports voiding voids 10-11x/day down from 30x/day Baseline: Day time voids 11-20 times per day.   Nocturia: 6x with baseline 3-7 times per night to void.  Now voids around 20x/day and 6-7x/night Drinks 60-80oz water /day, 1 can of diet caffinate soda and 8oz coffee

## 2024-05-31 NOTE — Patient Instructions (Addendum)
 For refractory OAB we reviewed the procedure for intravesical Botox injection with cystoscopy in the office and reviewed the risks, benefits and alternatives of treatment including but not limited to infection, need for self-catheterization and need for repeat therapy.  We discussed that there is a 5-15% chance of needing to catheterize with Botox and that this usually resolves in a few months; however can persist for longer periods of time.  Typically Botox injections would need to be repeated every 3-12 months since this is not a permanent therapy.   You will be contact by my office regarding scheduling of office botox injections. Please call the office if you experience any UTI symptoms such as burning with urination, blood in your urine or sudden onset in changes of urinary symptoms prior to your procedure.  Take Ciprofloxacin  the morning of your procedure. The prescription has been sent to [facility]  We discussed the role of sacral neuromodulation and how it works. It requires a test phase, and documentation of bladder function via diary. After a successful test period, a permanent wire and generator are placed in the OR. The battery lasts 5 years on average and would need to be replaced surgically.  The goal of this therapy is at least a 50% improvement in symptoms. It is NOT realistic to expect a 100% cure.  We reviewed the fact that about 30% of patients fail the test phase and are not candidates for permanent generator placement.  We discussed the risk of infection and that the patient would not be able to get an MRI once the device is placed. There are two companies that provide this therapy: [employer] and [employer]. [employer]' product is new and is similar to [employer]'s, but has advantages of a smaller and rechargeable battery and being able to have an MRI with the implant. For all procedures, we discussed risks of bleeding, infection, damage to surrounding organs including bowel, bladder,  blood vessels, ureters and nerves, need for further surgery, risk of postoperative urinary incontinence or retention with need to catheterize, recurrent prolapse, numbness and weakness at any body site, buttock pain, and the rarer risks of blood clot, heart attack, pneumonia, death.    We also discussed the role of percutaneous tibial nerve stimulation and how it works.  She understands it requires 12 weekly visits for temporary neuromodulation of the sacral nerve roots via the tibial nerve and that she may then require continued tapered treatment.

## 2024-06-03 ENCOUNTER — Encounter: Payer: Self-pay | Admitting: Pharmacist

## 2024-06-03 ENCOUNTER — Other Ambulatory Visit: Payer: Self-pay

## 2024-06-03 ENCOUNTER — Other Ambulatory Visit (HOSPITAL_COMMUNITY): Payer: Self-pay

## 2024-07-17 ENCOUNTER — Other Ambulatory Visit (HOSPITAL_COMMUNITY): Payer: Self-pay

## 2024-07-25 ENCOUNTER — Encounter: Payer: Self-pay | Admitting: Physical Therapy

## 2024-07-25 ENCOUNTER — Other Ambulatory Visit: Payer: Self-pay

## 2024-07-25 ENCOUNTER — Other Ambulatory Visit (HOSPITAL_COMMUNITY): Payer: Self-pay

## 2024-07-25 MED ORDER — LOSARTAN POTASSIUM 50 MG PO TABS
50.0000 mg | ORAL_TABLET | Freq: Every day | ORAL | 2 refills | Status: AC
  Filled 2024-07-25: qty 90, 90d supply, fill #0
  Filled 2024-09-30 – 2024-10-07 (×2): qty 90, 90d supply, fill #1
  Filled 2025-01-16: qty 90, 90d supply, fill #2

## 2024-08-13 ENCOUNTER — Other Ambulatory Visit (HOSPITAL_COMMUNITY): Payer: Self-pay

## 2024-08-13 ENCOUNTER — Other Ambulatory Visit: Payer: Self-pay

## 2024-09-03 ENCOUNTER — Telehealth: Payer: Self-pay

## 2024-09-03 NOTE — Telephone Encounter (Signed)
 Samantha Peck is called to provide pre-procedural instructions for her [] Cystoscopy, [x] Bladder Botox  or [] Urethral Bulking. She is scheduled on 09/05/2024.  The patient is not having worsening urinary symptoms to her urinary patterns. The patient is not experiencing any UTI symptoms. [] The patient is scheduled to come in 2-3 days prior for a non-provider visit to run a POC Urinalysis.  [] The patient cannot come in for a pre-procedural Urinalysis therefore discussed with provider to determine treatment prior to procedure.   [x] The patient has been prescribed the pre-procedural antibiotics. The patient is reminded to take the prescribed antibiotics the morning of the procedure, an hour before and the morning of the following day.   [] The patient has not previously been prescribed the pre-procedural anitbiotics, review with patient her medication allergies to determine the antibiotic prescription needed.  [] Since no allergies, a prescription for Bactrim  DS 800mg  #2, take 1 the morning of the procedure and 1 the morning after the procedure and one the morning after the procedure.  [] Since the patient is allergic to Sulfa , the patient will be prescribed Macrobid  100mg  #2, take 1 the morning of the procedure and 1 the morning after the procedure.   The patient is reminded to arrive 5 minutes prior to the scheduled appointment time and that a urine sample will need to be collected the upon arrival for the procedure.

## 2024-09-04 NOTE — Progress Notes (Unsigned)
 Intravesical Botox  Procedure:  58 y.o. yo F with OAB*** presenting today for intravesical botox .   Underwent pelvic floor PT, bladder training with timed voids, caffeine reduction. Failed mirabegron  and Gemtesa .  UUI *** , voids every 2 hours with 10-11x/day and nocturia 6x/night Drinks 60-80oz water /day, 1 can of diet caffinate soda and 8oz coffee  Hydroxyzine  with relief of nocturia, however worsened restless legs, overstimulation and mood swings.   Surgery: s/p Robotic assisted laparoscopic lysis of adhesion, hysterectomy, sacrocolpopexy, bilateral salpingectomy, posterior repair, perineorrhaphy, midurethral sling on 11/20/23 for stage III pelvic organ prolapse, stress urinary incontinence, constipation, and pelvic adhesions.   There were no vitals filed for this visit.  No results found for this or any previous visit (from the past 24 hours).   Prior to the procedure, the patient took *** for antibiotic prophylaxis. Time out was performed. The bladder was catherized and 50 ml of 2% lidocaine  was placed in the bladder and 10 ml of 2% lidocaine  jelly placed in the urethra. After 30 minutes the lidocaine  was drained. Cystoscopy was performed with sterile H2O and a 70 degree scope. Bladder mucosa was noted to be within normal limits. A total of *** ml / *** units of Botox  A,  Lot # *** Exp ***  was injected in the detrusor muscle via 5 injections of 2ml each. These were spaced about 1 cm apart. Care was taken to avoid the ureteral orifices and the trigone. Patient tolerated the procedure well.  Impression: 58 y.o. 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 Patient tolerated procedure well.  Plan: Post-procedure instructions given regarding bleeding, infection, urinary retention.  Self-catheterization teaching was previously performed.   Patient will follow up in 4 weeks All questions answered.  Lianne ONEIDA Gillis, MD

## 2024-09-05 ENCOUNTER — Encounter: Payer: Self-pay | Admitting: Obstetrics

## 2024-09-05 ENCOUNTER — Ambulatory Visit: Admitting: Obstetrics

## 2024-09-05 VITALS — BP 127/85 | HR 90

## 2024-09-05 DIAGNOSIS — N3281 Overactive bladder: Secondary | ICD-10-CM

## 2024-09-05 DIAGNOSIS — R350 Frequency of micturition: Secondary | ICD-10-CM

## 2024-09-05 LAB — POCT URINALYSIS DIP (CLINITEK)
Bilirubin, UA: NEGATIVE
Bilirubin, UA: NEGATIVE
Blood, UA: NEGATIVE
Blood, UA: NEGATIVE
Glucose, UA: NEGATIVE mg/dL
Glucose, UA: NEGATIVE mg/dL
Ketones, POC UA: NEGATIVE mg/dL
Ketones, POC UA: NEGATIVE mg/dL
Leukocytes, UA: NEGATIVE
Nitrite, UA: NEGATIVE
Nitrite, UA: NEGATIVE
POC PROTEIN,UA: NEGATIVE
POC PROTEIN,UA: NEGATIVE
Spec Grav, UA: >=1.03 — AB (ref 1.010–1.025)
Spec Grav, UA: >=1.03 — AB (ref 1.010–1.025)
Urobilinogen, UA: 0.2 U/dL
Urobilinogen, UA: 0.2 U/dL
pH, UA: 7 (ref 5.0–8.0)
pH, UA: 6.5 (ref 5.0–8.0)

## 2024-09-05 MED ORDER — LIDOCAINE HCL (PF) 2 % IJ SOLN: 50.0000 mL | Freq: Once | INTRAMUSCULAR | Status: DC

## 2024-09-05 MED ORDER — ONABOTULINUMTOXINA 100 UNITS IJ SOLR
100.0000 [IU] | Freq: Once | INTRAMUSCULAR | Status: AC
  Administered 2024-09-05: 100 [IU] via INTRAMUSCULAR

## 2024-09-05 MED ORDER — LIDOCAINE HCL 2 % IJ SOLN
20.0000 mL | Freq: Once | INTRAMUSCULAR | Status: AC
  Administered 2024-09-05: 400 mg

## 2024-09-05 MED ORDER — LIDOCAINE HCL URETHRAL/MUCOSAL 2 % EX GEL
1.0000 | Freq: Once | CUTANEOUS | Status: AC
  Administered 2024-09-05: 1 via URETHRAL

## 2024-09-05 NOTE — Patient Instructions (Signed)

## 2024-09-05 NOTE — Addendum Note (Signed)
 Addended by: KRYSTAL ANDREE GAILS on: 09/05/2024 11:52 AM   Modules accepted: Orders

## 2024-09-05 NOTE — Addendum Note (Signed)
 Addended by: KRYSTAL ANDREE GAILS on: 09/05/2024 11:54 AM   Modules accepted: Orders

## 2024-09-10 ENCOUNTER — Telehealth: Payer: Self-pay

## 2024-09-10 NOTE — Telephone Encounter (Signed)
 Patient calls today with concerns of  UTI. She is having increased urgency, frequency with constant urge to urinate and burning. No signs of fever or flank pain. She had Botox  on on 09-05-2024. Please advise.

## 2024-09-11 ENCOUNTER — Other Ambulatory Visit (HOSPITAL_COMMUNITY)
Admission: RE | Admit: 2024-09-11 | Discharge: 2024-09-11 | Disposition: A | Discharge status: Home / Self Care | Source: Ambulatory Visit | Attending: Obstetrics | Admitting: Obstetrics

## 2024-09-11 ENCOUNTER — Ambulatory Visit (INDEPENDENT_AMBULATORY_CARE_PROVIDER_SITE_OTHER)

## 2024-09-11 VITALS — BP 132/81 | HR 79 | Temp 98.0°F

## 2024-09-11 DIAGNOSIS — R350 Frequency of micturition: Secondary | ICD-10-CM | POA: Diagnosis present

## 2024-09-11 LAB — POCT URINALYSIS DIP (CLINITEK)
Bilirubin, UA: NEGATIVE
Blood, UA: NEGATIVE
Glucose, UA: NEGATIVE mg/dL
Ketones, POC UA: NEGATIVE mg/dL
Leukocytes, UA: NEGATIVE
Nitrite, UA: NEGATIVE
POC PROTEIN,UA: NEGATIVE
Spec Grav, UA: 1.015 (ref 1.010–1.025)
Urobilinogen, UA: 0.2 U/dL
pH, UA: 7 (ref 5.0–8.0)

## 2024-09-11 NOTE — Patient Instructions (Signed)
 Please call if you continue to experience persistent or worsening urinary symptoms such as fever > 100.4, nausea/vomiting, one sided back pain or blood in your urine.    It was a pleasure to see you today!  Thank you for trusting me with your care!

## 2024-09-11 NOTE — Progress Notes (Signed)
 Samantha Peck arrived today with urinary frequency, urinary retention, and urinary urgency. Samantha Peck is s/p of Botox  on 09-05-2024. Patient is not experiencing fever, unstable vitals and/or one-sided back flank pain. Patient has not had a recent hospitalization due to UTI.   Per Dr schroeder, she will need to self cath. We reviewed the self-cath teaching. I demonstrated on the pt using a mirror how to use a 74fr cath to drain urine from the bladder. Pt was able to demonstrate successfully using the catheter and verbalized understanding. Pt was given a self-cath bag with several catheters, lubricant, a mirror, measuring hat.   Per protocol:   The most recent Urinalysis completed on 09-05-2024 and was normal.  Last Creatinine level  Lab Results  Component Value Date   CREATININE 0.74 11/15/2023    An urine specimen was collected and POCT urinalysis completed.  [x] A cath specimen was collected due to patient's current condition, symptoms or post-procedural state.  Total urine output by catheter is .  Output by Drain (mL) 09/09/24 0701 - 09/09/24 1900 09/09/24 1901 - 09/10/24 0700 09/10/24 0701 - 09/10/24 1900 09/10/24 1901 - 09/11/24 0700 09/11/24 0701 - 09/11/24 0817  Requested LDAs do not have output data documented.    Samantha Peck    POCT Urine results is normal via cath vs self voiding showed small leukocytes.  Urine micro was not sent per protocol for abnormal urinalysis.  Urine culture was sent per protocol for abnormal urinalysis.     [] Pt was notified of positive urine results and plan for additional urine testing. We will contact you within the next 3-4 days with these results.  [x] No Prescription was sent to your pharmacy.  The additional testing will indicate if a prescription is needed.   [] Patient was notified of abnormal urine results. The following prescription is sent to your preferred pharmacy.  []  Macrobid  100mg  #10 1 tablet by mouth twice daily with food for 5 days      []   Bactrim  DS 800-160mg  #6 1 tablet by mouth twice daily for 3 days        []  Due to your current medication allergies, an alternate prescription was discussed with your provider and will be prescribed and sent to your pharmacy.  [] You can take over the counter AZO two tablets up to three times a day for two days.  Take AZO tablets with a full glass of water . AZO will turn your urine orange, this is normal.   [] The patient was notified of negative urine results.  If symptoms persist, you may take over the counter AZO two tablets up to three times a day for two days.  AZO will turn your urine orange, this is normal.  Contact the office back to schedule an appointment if your symptoms persist or worsen or you develop additional symptoms.       CC'd note to patient's provider.

## 2024-09-12 ENCOUNTER — Ambulatory Visit: Payer: Self-pay | Admitting: Obstetrics

## 2024-09-12 LAB — URINE CULTURE: Culture: NO GROWTH

## 2024-09-13 ENCOUNTER — Telehealth: Payer: Self-pay

## 2024-09-13 NOTE — Telephone Encounter (Signed)
 Called patient after leaving a voicemail for questions regarding the CIC. Reviewed instructions for bladder diary.  Discussed the sterilization process. Pt has been doing well with CIC. She has been self catheterizing 4 -6 times a day due to sensation of needing to go. Asked to keep using her log and to see if she can lesson the 4-6 times a day to 3-4 times. Patient has high anxiety and nervous her bladder wont empty. Some reassurance given and she will call Monday with updates.

## 2024-09-19 NOTE — Telephone Encounter (Addendum)
 Patient called today. She is still the self cath method several times a she states she can not urinate by her self. Today alone she has voided only dribbles, cath-4am 350 730am 375 11:30am 200

## 2024-09-30 ENCOUNTER — Other Ambulatory Visit (HOSPITAL_COMMUNITY): Payer: Self-pay

## 2024-10-01 ENCOUNTER — Encounter: Payer: Self-pay | Admitting: Obstetrics

## 2024-10-01 ENCOUNTER — Other Ambulatory Visit (HOSPITAL_COMMUNITY): Payer: Self-pay

## 2024-10-01 ENCOUNTER — Other Ambulatory Visit: Payer: Self-pay

## 2024-10-01 MED ORDER — CHLORTHALIDONE 25 MG PO TABS
12.5000 mg | ORAL_TABLET | Freq: Every day | ORAL | 0 refills | Status: DC
  Filled 2024-10-01: qty 45, 90d supply, fill #0

## 2024-10-02 ENCOUNTER — Other Ambulatory Visit (HOSPITAL_COMMUNITY): Payer: Self-pay

## 2024-10-03 ENCOUNTER — Other Ambulatory Visit: Payer: Self-pay

## 2024-10-04 ENCOUNTER — Other Ambulatory Visit: Payer: Self-pay

## 2024-10-04 ENCOUNTER — Ambulatory Visit: Admitting: Obstetrics

## 2024-10-04 ENCOUNTER — Encounter: Payer: Self-pay | Admitting: Obstetrics

## 2024-10-04 ENCOUNTER — Other Ambulatory Visit (HOSPITAL_COMMUNITY): Payer: Self-pay

## 2024-10-04 VITALS — BP 134/89 | HR 81

## 2024-10-04 DIAGNOSIS — R339 Retention of urine, unspecified: Secondary | ICD-10-CM | POA: Diagnosis not present

## 2024-10-04 LAB — POCT URINALYSIS DIP (CLINITEK)
Bilirubin, UA: NEGATIVE
Blood, UA: NEGATIVE
Glucose, UA: NEGATIVE mg/dL
Leukocytes, UA: NEGATIVE
Nitrite, UA: NEGATIVE
POC PROTEIN,UA: NEGATIVE
Spec Grav, UA: 1.025 (ref 1.010–1.025)
Urobilinogen, UA: 0.2 U/dL
pH, UA: 6.5 (ref 5.0–8.0)

## 2024-10-04 MED ORDER — TAMSULOSIN HCL 0.4 MG PO CAPS
0.4000 mg | ORAL_CAPSULE | Freq: Every day | ORAL | 0 refills | Status: AC
  Filled 2024-10-04: qty 30, 30d supply, fill #0

## 2024-10-04 NOTE — Patient Instructions (Addendum)
 Reduce clean intermittent catheterization to twice a day.   Move your chlorthalidone  to after lunch.   Resume pelvic floor relaxation exercises.   Try flomax to assess bladder emptying.  Stop if you experienced lightheadedness or dizziness.

## 2024-10-04 NOTE — Progress Notes (Signed)
 [facility]  Date of Visit: 10/04/2024  History of Present Illness: [name] is a 58 y.o. female scheduled today for 6 months follow-up visit.   Surgery: s/p Robotic assisted laparoscopic lysis of adhesion, hysterectomy, sacrocolpopexy, bilateral salpingectomy, posterior repair, perineorrhaphy, midurethral sling on 11/20/23 for stage III pelvic organ prolapse, stress urinary incontinence, constipation, and pelvic adhesions.  Underwent botox  injection 100U on 09/05/24, reports sensation of incomplete emptying Started CIC 3-6x/day due to sensation of frequency/urgency since 09/11/2024. Voids 0-3x/night CIC volume ranging from 100-470mL, with 1-2 CIC volumes/day > mostly with 1st void.  Sleep overnight last night.  Urine culture 09/11/24 with no growth Reports intermittent dysuria Soaks catheters in alcohol for reuse Did not take chlorthalidone  today.  From prior visit: Underwent PFPT x 4 and started bladder training with timed voids. Reports some reduction of UUI and urinary frequency every 2 hours Denies prior relief from Mirabegron  or Gemtesa  Reduced caffeine intake without relief.  Stopped amitriptyline  due to concerns with leakage at night if she is too sleepy.  Reports voiding voids  10-11x/day down from 30x/day Baseline: Day time voids 11-20x/day Nocturia: 6x with baseline 3-7x/night to void.  Drinks 60-80oz water /day, 1 can of diet caffinate soda and 8oz coffee Cystoscopy negative for injury or foreign material on 01/25/24 Previously reported relief of night time urinary frequency from hydroxyzine , however it worsens restless legs, overstimulation and mood swings. History of Grave's disease  Medications: She has a current medication list which includes the following prescription(s): tamsulosin, chlorthalidone , ciprofloxacin , losartan , and methimazole .   Allergies: Patient has no known allergies.   Physical Exam: Today's Vitals   10/04/24 1403  BP: 134/89   Pulse: 81   There is no height or weight on file to calculate BMI.  Straight Catheterization Procedure for PVR: After verbal consent was obtained from the patient for catheterization to assess bladder emptying and residual volume the urethra and surrounding tissues were prepped with betadine  and an in and out catheterization was performed.  PVR was 80mL.  Urine appeared dark yellow. The patient tolerated the procedure well.  Lab Results  Component Value Date   COLORU yellow 10/04/2024   CLARITYU clear 10/04/2024   GLUCOSEUR negative 10/04/2024   BILIRUBINUR negative 10/04/2024   KETONESU Negative 01/25/2024   SPECGRAV 1.025 10/04/2024   RBCUR negative 10/04/2024   PHUR 6.5 10/04/2024   PROTEINUR Negative 01/25/2024   UROBILINOGEN 0.2 10/04/2024   LEUKOCYTESUR Negative 10/04/2024     Assessment and Plan:  1. Urinary retention      Urinary retention Assessment & Plan: - reviewed bladder diary - POCT UA negative from catheterized sample - discouraged CIC with volume < . Reviewed IUGA handout and CIC volume ranges and frequency of CIC recommended.  - reduce CIC to 2x/day (1st void and before bed) and reassess symptoms, encouraged to reduce CIC frequency or discontinue when she is able to void spontaneously with CIC volume < 150 x 3.  - encouraged to resume pelvic floor relaxation exercises - switch chlorthalidone  to afternoon dosing - Rx Flomax to reassess symptoms  Orders: -     Tamsulosin HCl; Take 1 capsule (0.4 mg total) by mouth daily.  Dispense: 30 capsule; Refill: 0 -     POCT URINALYSIS DIP (CLINITEK)    Return in about 4 weeks (around 11/01/2024).  Time spent: I spent 31 minutes dedicated to the care of this patient on the date of this encounter to include pre-visit review of records, face-to-face time with the patient  discussing urinary retention and post visit documentation and ordering medication/ testing.

## 2024-10-04 NOTE — Assessment & Plan Note (Addendum)
-   reviewed bladder diary - POCT UA negative from catheterized sample - discouraged CIC with volume < . Reviewed IUGA handout and CIC volume ranges and frequency of CIC recommended.  - reduce CIC to 2x/day (1st void and before bed) and reassess symptoms, encouraged to reduce CIC frequency or discontinue when she is able to void spontaneously with CIC volume < 150 x 3.  - encouraged to resume pelvic floor relaxation exercises - switch chlorthalidone  to afternoon dosing - Rx Flomax to reassess symptoms

## 2024-11-13 NOTE — Progress Notes (Unsigned)
 [facility]  Date of Visit: 11/14/2024  History of Present Illness: [name] is a 58 y.o. female scheduled today for 3 months follow-up visit after botox  injection 09/05/24.   Surgery: s/p Robotic assisted laparoscopic lysis of adhesion, hysterectomy, sacrocolpopexy, bilateral salpingectomy, posterior repair, perineorrhaphy, midurethral sling on 11/20/23 for stage III pelvic organ prolapse, stress urinary incontinence, constipation, and pelvic adhesions. - Underwent botox  injection 100U on 09/05/24  - Cystoscopy negative 02/07/2024  Mother-in-law passed away 10/20/24 with increased stressors due to need for settling affairs.  After last visit, stopped CIC 3-6x/day and changed to twice a day with return of spontaneous voids Now CIC x 2/day, CIC volume 200-344mL in the am and 100-123mL before bed Voids 10x/day, 3-4x/night Went to a [place] race recently with UUI all throughout the day after 3-4 beers. Prior use of Gemtesa  with reduction of urgency but not leakage  Prior visit: Started CIC 3-6x/day due to sensation of frequency/urgency since 09/11/2024. Voids 0-3x/night CIC volume ranging from 100-432mL, with 1-2 CIC volumes/day > mostly with 1st void.  Sleep overnight last night.  Urine culture 09/11/24 with no growth Denies intermittent dysuria or hematuria Soaks catheters in alcohol for reuse  From prior visit: Underwent PFPT x 4 and started bladder training with timed voids. Reports some reduction of UUI and urinary frequency every 2 hours Denies prior relief from Mirabegron  or Gemtesa  Reduced caffeine intake without relief.  Stopped amitriptyline  due to concerns with leakage at night if she is too sleepy.  Reports voiding voids  10-11x/day down from 30x/day Baseline: Day time voids 11-20x/day Nocturia: 6x with baseline 3-7x/night to void.  Drinks 60-80oz water /day, 1 can of diet caffinate soda and 8oz coffee Cystoscopy negative for injury or foreign material on  07-Feb-2024 Previously reported relief of night time urinary frequency from hydroxyzine , however it worsens restless legs, overstimulation and mood swings. History of Grave's disease  Medications: She has a current medication list which includes the following prescription(s): cimetidine , trospium chloride, chlorthalidone , ciprofloxacin , losartan , methimazole , and tamsulosin .   Allergies: Patient has no known allergies.   Physical Exam: Today's Vitals   11/14/24 1246  BP: 133/82  Pulse: 69    Lab Results  Component Value Date   COLORU yellow 10/04/2024   CLARITYU clear 10/04/2024   GLUCOSEUR negative 10/04/2024   BILIRUBINUR negative 10/04/2024   KETONESU Negative 02/07/24   SPECGRAV 1.025 10/04/2024   RBCUR negative 10/04/2024   PHUR 6.5 10/04/2024   PROTEINUR Negative 02-07-2024   UROBILINOGEN 0.2 10/04/2024   LEUKOCYTESUR Negative 10/04/2024     Assessment and Plan:  1. Bladder irritation   2. OAB (overactive bladder)   3. Urinary retention   4. History of pelvic surgery   5. Nocturia       Bladder irritation Assessment & Plan: - reports discomfort from sensation of bladder fullness if she delays urge - For irritative bladder we reviewed treatment options including altering her diet to avoid irritative beverages and foods as well as attempting to decrease stress and other exacerbating factors.  We also discussed using pyridium  and similar over-the-counter medications for pain relief as needed. We discussed the pentad of medications including Tums, an antihistamine such as Vistaril , amitriptyline , and L-arginine.  We also discussed in-office bladder instillations for pain flares, as well as cystoscopy with hydrodistention in the operating room, which can be both diagnostic and therapeutic.  - Relief from amitriptyline  in the past, discontinued due to concerns of night time urinary leakage with sedation - Cystoscopy negative  01/25/24 - underwent bladder instillation x 1 in  01/01/2024, concerns with copay and cost - Hydroxyzine  with relief of night time frequency but worsened RLS - encouraged patient to monitor triggers and stressors due to association with bladder symptoms - Rx cimetidine  and titrate up to 2x/day if tolerated - consider Uribel - reviewed low dose vaginal estrogen, pt declines at this time due to cost  Orders: -     Cimetidine ; Take 1 tablet (200 mg total) by mouth 2 (two) times daily.  Dispense: 30 tablet; Refill: 2  OAB (overactive bladder) Assessment & Plan: - Underwent pelvic floor PT, bladder training with timed voids, caffeine reduction.  - Failed mirabegron  and Gemtesa .  - Voids 10-11x/day  - Drinks 60-80oz water /day, 1 can of diet caffinate soda and 8oz coffee  - returned to baseline urinary frequency and urgency prior to surgery with exacerbation postop - preop UDS with DOI and dyssynergic EMG, discussed need for repeat UDS due to midurethral sling placement - prior relief with pelvic floor PT, encouraged to consider returning to pelvic floor PT - Rx for trial of Trospium and cimetidine . Provided information for [facility] if cost prohibitive - botox  injections 100U resulted in urinary retention - reviewed PTNS and SNM, patient reports concerns due to cost of copay. Reassess after trial of Trospium and consider dual therapy - Please review side effects of anticholinergic medications and the potential side effects of anticholinergics including dry eyes, dry mouth, constipation, cognitive impairment and urinary retention. Stop medication and seek care immediately if she experiences visual changes or inability to void. Resume CIC if transient urinary retention with Trospium  Orders: -     Trospium Chloride ER; Take 1 capsule (60 mg total) by mouth daily.  Dispense: 30 capsule; Refill: 2  Urinary retention Assessment & Plan: - s/p  botox  injection 100U on 09/05/24 - did not provide bladder diary - 09/11/24 POCT UA negative from  catheterized sample - reduce CIC to 0-1x/day and discontinue CIC if x 3 due to return of spontaneous voids after discontinuation of CIC 6x/day - encouraged pelvic floor relaxation exercises and consider return to pelvic floor PT - switch chlorthalidone  to afternoon dosing - discontinue flomax  - reviewed urodynamics, patient desires to postpone until after discontination of CIC to reassess bladder symptoms - trial of Trospium and reassess in 3 months, advised to increase frequency and stop medication if signs and symptoms of urinary retention such as increased frequency, urgency, leakage, or sensation of incomplete emptying   History of pelvic surgery Assessment & Plan: - s/p Robotic assisted laparoscopic lysis of adhesion, hysterectomy, sacrocolpopexy, bilateral salpingectomy, posterior repair, perineorrhaphy, midurethral sling on 11/20/23 - postoperative PVR WNL with urinary retention started after bladder botox  injections - reviewed need for repeat urodynamics to assess bladder emptying after midurethral sling and sling revision if abnormal testing    Nocturia Assessment & Plan: - voids 3-4x/night, down from 6x/night - denies relief with mirabegron  and gemtesa  - avoid fluid intake 3 hours before bedtime - switch diuretic (e.g. chlorthalidone ) dosing to 2pm - previously improved with night time hydroxyzine  but cannot tolerate exacerbation of restless legs (similar effect from benadryl) - trial of trospium 60mg  at bedtime - Rx to repeat trial of Cimetidine  200mg  at bedtime, can increase to 400mg  if no daytime sedation   Orders: -     Trospium Chloride ER; Take 1 capsule (60 mg total) by mouth daily.  Dispense: 30 capsule; Refill: 2   Return in about 3 months (around  02/12/2025).  Time spent: I spent 33 minutes dedicated to the care of this patient on the date of this encounter to include pre-visit review of records, face-to-face time with the patient discussing urinary retention,  bladder irritation, history of pelvic surgery, OAB, and post visit documentation and ordering medication/ testing.

## 2024-11-14 ENCOUNTER — Encounter: Payer: Self-pay | Admitting: Obstetrics

## 2024-11-14 ENCOUNTER — Ambulatory Visit: Admitting: Obstetrics

## 2024-11-14 ENCOUNTER — Other Ambulatory Visit (HOSPITAL_COMMUNITY): Payer: Self-pay

## 2024-11-14 VITALS — BP 133/82 | HR 69

## 2024-11-14 DIAGNOSIS — R339 Retention of urine, unspecified: Secondary | ICD-10-CM | POA: Diagnosis not present

## 2024-11-14 DIAGNOSIS — N3281 Overactive bladder: Secondary | ICD-10-CM

## 2024-11-14 DIAGNOSIS — Z9889 Other specified postprocedural states: Secondary | ICD-10-CM

## 2024-11-14 DIAGNOSIS — R351 Nocturia: Secondary | ICD-10-CM | POA: Diagnosis not present

## 2024-11-14 DIAGNOSIS — N3289 Other specified disorders of bladder: Secondary | ICD-10-CM | POA: Diagnosis not present

## 2024-11-14 MED ORDER — TROSPIUM CHLORIDE ER 60 MG PO CP24
1.0000 | ORAL_CAPSULE | Freq: Every day | ORAL | 2 refills | Status: AC
  Filled 2024-11-14: qty 30, 30d supply, fill #0

## 2024-11-14 MED ORDER — CIMETIDINE 200 MG PO TABS
200.0000 mg | ORAL_TABLET | Freq: Two times a day (BID) | ORAL | 2 refills | Status: AC
  Filled 2024-11-14: qty 30, 15d supply, fill #0

## 2024-11-14 NOTE — Assessment & Plan Note (Addendum)
-   reports discomfort from sensation of bladder fullness if she delays urge - For irritative bladder we reviewed treatment options including altering her diet to avoid irritative beverages and foods as well as attempting to decrease stress and other exacerbating factors.  We also discussed using pyridium  and similar over-the-counter medications for pain relief as needed. We discussed the pentad of medications including Tums, an antihistamine such as Vistaril , amitriptyline , and L-arginine.  We also discussed in-office bladder instillations for pain flares, as well as cystoscopy with hydrodistention in the operating room, which can be both diagnostic and therapeutic.  - Relief from amitriptyline  in the past, discontinued due to concerns of night time urinary leakage with sedation - Cystoscopy negative 01/25/24 - underwent bladder instillation x 1 in 01/01/2024, concerns with copay and cost - Hydroxyzine  with relief of night time frequency but worsened RLS - encouraged patient to monitor triggers and stressors due to association with bladder symptoms - Rx cimetidine  and titrate up to 2x/day if tolerated - consider Uribel - reviewed low dose vaginal estrogen, pt declines at this time due to cost

## 2024-11-14 NOTE — Assessment & Plan Note (Addendum)
-   Underwent pelvic floor PT, bladder training with timed voids, caffeine reduction.  - Failed mirabegron  and Gemtesa .  - Voids 10-11x/day  - Drinks 60-80oz water /day, 1 can of diet caffinate soda and 8oz coffee  - returned to baseline urinary frequency and urgency prior to surgery with exacerbation postop - preop UDS with DOI and dyssynergic EMG, discussed need for repeat UDS due to midurethral sling placement - prior relief with pelvic floor PT, encouraged to consider returning to pelvic floor PT - Rx for trial of Trospium and cimetidine . Provided information for [facility] if cost prohibitive - botox  injections 100U resulted in urinary retention - reviewed PTNS and SNM, patient reports concerns due to cost of copay. Reassess after trial of Trospium and consider dual therapy - Please review side effects of anticholinergic medications and the potential side effects of anticholinergics including dry eyes, dry mouth, constipation, cognitive impairment and urinary retention. Stop medication and seek care immediately if she experiences visual changes or inability to void. Resume CIC if transient urinary retention with Trospium

## 2024-11-14 NOTE — Assessment & Plan Note (Addendum)
-   voids 3-4x/night, down from 6x/night - denies relief with mirabegron  and gemtesa  - avoid fluid intake 3 hours before bedtime - switch diuretic (e.g. chlorthalidone ) dosing to 2pm - previously improved with night time hydroxyzine  but cannot tolerate exacerbation of restless legs (similar effect from benadryl) - trial of trospium  60mg  at bedtime - Rx to repeat trial of Cimetidine  200mg  at bedtime, can increase to 400mg  if no daytime sedation

## 2024-11-14 NOTE — Assessment & Plan Note (Signed)
-   s/p Robotic assisted laparoscopic lysis of adhesion, hysterectomy, sacrocolpopexy, bilateral salpingectomy, posterior repair, perineorrhaphy, midurethral sling on 11/20/23 - postoperative PVR WNL with urinary retention started after bladder botox  injections - reviewed need for repeat urodynamics to assess bladder emptying after midurethral sling and sling revision if abnormal testing

## 2024-11-14 NOTE — Patient Instructions (Addendum)
 We discussed the symptoms of overactive bladder (OAB), which include urinary urgency, urinary frequency, night-time urination, with or without urge incontinence.  We discussed management including behavioral therapy (decreasing bladder irritants by following a bladder diet, urge suppression strategies, timed voids, bladder retraining), physical therapy, medication; and for refractory cases posterior tibial nerve stimulation, sacral neuromodulation, and intravesical botulinum toxin injection.   For anticholinergic medications, we discussed the potential side effects of anticholinergics including dry eyes, dry mouth, constipation, rare risks of cognitive impairment and urinary retention. You were given trospium for 60mg  once daily.  It can take a month to start working so give it time, but if you have bothersome side effects call sooner and we can try a different medication.  Call us  if you have trouble filling the prescription or if it's not covered by your insurance.  Please visit the website below to sign up for an account. We will need an email address to send along with your prescription to verify your prescription once you have signed up.  [email or web address]  Trospium Chloride ER Capsule Extended Release  60mg   30 count $31.11  Or    Trospium Chloride (2 times a day dosing) Tablet  20mg   60 count  $14.03  Resume cimetidine  200mg  at bedtime, monitor for sedation or change in urinary symptoms.  If no sedation, can increase to 400mg  daily.   Continue clean intermittent catheterization once in the morning, stop if you see volume < x 3.  Call if you desire to proceed with urodynamics

## 2024-11-14 NOTE — Assessment & Plan Note (Addendum)
-   s/p  botox  injection 100U on 09/05/24 - did not provide bladder diary - 09/11/24 POCT UA negative from catheterized sample - reduce CIC to 0-1x/day and discontinue CIC if x 3 due to return of spontaneous voids after discontinuation of CIC 6x/day - encouraged pelvic floor relaxation exercises and consider return to pelvic floor PT - switch chlorthalidone  to afternoon dosing - discontinue flomax  - reviewed urodynamics, patient desires to postpone until after discontination of CIC to reassess bladder symptoms - trial of Trospium and reassess in 3 months, advised to increase frequency and stop medication if signs and symptoms of urinary retention such as increased frequency, urgency, leakage, or sensation of incomplete emptying

## 2024-11-15 ENCOUNTER — Other Ambulatory Visit

## 2024-11-16 ENCOUNTER — Other Ambulatory Visit (HOSPITAL_COMMUNITY): Payer: Self-pay

## 2024-11-16 LAB — TSH: TSH: 1.96 m[IU]/L (ref 0.40–4.50)

## 2024-11-16 LAB — T3, FREE: T3, Free: 3.4 pg/mL (ref 2.3–4.2)

## 2024-11-16 LAB — T4, FREE: Free T4: 1.3 ng/dL (ref 0.8–1.8)

## 2024-12-23 ENCOUNTER — Encounter: Payer: Self-pay | Admitting: *Deleted

## 2024-12-24 ENCOUNTER — Other Ambulatory Visit (HOSPITAL_COMMUNITY): Payer: Self-pay

## 2024-12-25 ENCOUNTER — Other Ambulatory Visit (HOSPITAL_COMMUNITY): Payer: Self-pay

## 2024-12-25 MED ORDER — CHLORTHALIDONE 25 MG PO TABS
12.5000 mg | ORAL_TABLET | Freq: Every day | ORAL | 0 refills | Status: AC
  Filled 2024-12-25: qty 45, 90d supply, fill #0

## 2025-01-15 ENCOUNTER — Other Ambulatory Visit: Payer: Self-pay | Admitting: Family Medicine

## 2025-01-15 DIAGNOSIS — Z1231 Encounter for screening mammogram for malignant neoplasm of breast: Secondary | ICD-10-CM

## 2025-01-16 ENCOUNTER — Other Ambulatory Visit (HOSPITAL_COMMUNITY): Payer: Self-pay

## 2025-02-13 ENCOUNTER — Ambulatory Visit: Admitting: Obstetrics

## 2025-03-03 ENCOUNTER — Ambulatory Visit

## 2025-05-16 ENCOUNTER — Ambulatory Visit: Admitting: Internal Medicine
